# Patient Record
Sex: Female | Born: 1950
Health system: Southern US, Community
[De-identification: ages and names within clinical notes are randomized; demographics above are authoritative.]

## PROBLEM LIST (undated history)

## (undated) DIAGNOSIS — M1990 Unspecified osteoarthritis, unspecified site: Secondary | ICD-10-CM

## (undated) DIAGNOSIS — F329 Major depressive disorder, single episode, unspecified: Secondary | ICD-10-CM

## (undated) DIAGNOSIS — C9110 Chronic lymphocytic leukemia of B-cell type not having achieved remission: Secondary | ICD-10-CM

## (undated) DIAGNOSIS — K219 Gastro-esophageal reflux disease without esophagitis: Secondary | ICD-10-CM

## (undated) DIAGNOSIS — D72829 Elevated white blood cell count, unspecified: Secondary | ICD-10-CM

## (undated) DIAGNOSIS — D649 Anemia, unspecified: Secondary | ICD-10-CM

## (undated) DIAGNOSIS — F32A Depression, unspecified: Secondary | ICD-10-CM

## (undated) DIAGNOSIS — C801 Malignant (primary) neoplasm, unspecified: Secondary | ICD-10-CM

## (undated) DIAGNOSIS — F419 Anxiety disorder, unspecified: Secondary | ICD-10-CM

## (undated) DIAGNOSIS — G43909 Migraine, unspecified, not intractable, without status migrainosus: Secondary | ICD-10-CM

## (undated) DIAGNOSIS — H269 Unspecified cataract: Secondary | ICD-10-CM

## (undated) DIAGNOSIS — E041 Nontoxic single thyroid nodule: Secondary | ICD-10-CM

## (undated) DIAGNOSIS — T7840XA Allergy, unspecified, initial encounter: Secondary | ICD-10-CM

## (undated) HISTORY — PX: APPENDECTOMY: SHX54

## (undated) HISTORY — DX: Anemia, unspecified: D64.9

## (undated) HISTORY — PX: CERVICAL FUSION: SHX112

## (undated) HISTORY — DX: Malignant (primary) neoplasm, unspecified: C80.1

## (undated) HISTORY — PX: JOINT REPLACEMENT: SHX530

## (undated) HISTORY — DX: Unspecified cataract: H26.9

## (undated) HISTORY — PX: TONSILLECTOMY: SUR1361

## (undated) HISTORY — PX: OTHER SURGICAL HISTORY: SHX169

## (undated) HISTORY — DX: Unspecified osteoarthritis, unspecified site: M19.90

## (undated) HISTORY — PX: SPINE SURGERY: SHX786

## (undated) HISTORY — PX: BLADDER SURGERY: SHX569

## (undated) HISTORY — DX: Gastro-esophageal reflux disease without esophagitis: K21.9

## (undated) HISTORY — DX: Depression, unspecified: F32.A

## (undated) HISTORY — DX: Elevated white blood cell count, unspecified: D72.829

## (undated) HISTORY — PX: DILATION AND CURETTAGE OF UTERUS: SHX78

## (undated) HISTORY — DX: Allergy, unspecified, initial encounter: T78.40XA

## (undated) HISTORY — DX: Migraine, unspecified, not intractable, without status migrainosus: G43.909

## (undated) HISTORY — DX: Major depressive disorder, single episode, unspecified: F32.9

## (undated) HISTORY — DX: Anxiety disorder, unspecified: F41.9

---

## 1998-03-27 ENCOUNTER — Emergency Department (HOSPITAL_COMMUNITY): Admission: EM | Admit: 1998-03-27 | Discharge: 1998-03-27 | Payer: Self-pay | Admitting: Internal Medicine

## 1998-12-23 ENCOUNTER — Other Ambulatory Visit: Admission: RE | Admit: 1998-12-23 | Discharge: 1998-12-23 | Payer: Self-pay | Admitting: Obstetrics and Gynecology

## 1999-03-29 ENCOUNTER — Other Ambulatory Visit: Admission: RE | Admit: 1999-03-29 | Discharge: 1999-03-29 | Payer: Self-pay | Admitting: Obstetrics and Gynecology

## 1999-06-28 ENCOUNTER — Other Ambulatory Visit: Admission: RE | Admit: 1999-06-28 | Discharge: 1999-06-28 | Payer: Self-pay | Admitting: Obstetrics and Gynecology

## 1999-07-19 ENCOUNTER — Other Ambulatory Visit: Admission: RE | Admit: 1999-07-19 | Discharge: 1999-07-19 | Payer: Self-pay | Admitting: *Deleted

## 1999-07-19 ENCOUNTER — Encounter (INDEPENDENT_AMBULATORY_CARE_PROVIDER_SITE_OTHER): Payer: Self-pay | Admitting: *Deleted

## 1999-09-14 ENCOUNTER — Ambulatory Visit (HOSPITAL_BASED_OUTPATIENT_CLINIC_OR_DEPARTMENT_OTHER): Admission: RE | Admit: 1999-09-14 | Discharge: 1999-09-14 | Payer: Self-pay | Admitting: *Deleted

## 1999-10-22 ENCOUNTER — Other Ambulatory Visit: Admission: RE | Admit: 1999-10-22 | Discharge: 1999-10-22 | Payer: Self-pay | Admitting: *Deleted

## 2000-01-31 ENCOUNTER — Other Ambulatory Visit: Admission: RE | Admit: 2000-01-31 | Discharge: 2000-01-31 | Payer: Self-pay | Admitting: Obstetrics and Gynecology

## 2000-05-05 ENCOUNTER — Other Ambulatory Visit: Admission: RE | Admit: 2000-05-05 | Discharge: 2000-05-05 | Payer: Self-pay | Admitting: *Deleted

## 2000-07-09 ENCOUNTER — Emergency Department (HOSPITAL_COMMUNITY): Admission: EM | Admit: 2000-07-09 | Discharge: 2000-07-09 | Payer: Self-pay | Admitting: Emergency Medicine

## 2000-08-21 ENCOUNTER — Encounter: Admission: RE | Admit: 2000-08-21 | Discharge: 2000-08-21 | Payer: Self-pay | Admitting: Obstetrics and Gynecology

## 2000-08-21 ENCOUNTER — Encounter: Payer: Self-pay | Admitting: Obstetrics and Gynecology

## 2000-09-20 ENCOUNTER — Other Ambulatory Visit: Admission: RE | Admit: 2000-09-20 | Discharge: 2000-09-20 | Payer: Self-pay | Admitting: Obstetrics and Gynecology

## 2001-01-09 ENCOUNTER — Ambulatory Visit (HOSPITAL_COMMUNITY): Admission: RE | Admit: 2001-01-09 | Discharge: 2001-01-09 | Payer: Self-pay | Admitting: Obstetrics and Gynecology

## 2001-01-09 ENCOUNTER — Encounter: Payer: Self-pay | Admitting: Obstetrics and Gynecology

## 2001-01-30 ENCOUNTER — Other Ambulatory Visit: Admission: RE | Admit: 2001-01-30 | Discharge: 2001-01-30 | Payer: Self-pay | Admitting: Obstetrics and Gynecology

## 2002-04-17 ENCOUNTER — Other Ambulatory Visit: Admission: RE | Admit: 2002-04-17 | Discharge: 2002-04-17 | Payer: Self-pay | Admitting: Obstetrics and Gynecology

## 2002-04-18 ENCOUNTER — Ambulatory Visit (HOSPITAL_COMMUNITY): Admission: RE | Admit: 2002-04-18 | Discharge: 2002-04-18 | Payer: Self-pay | Admitting: Obstetrics and Gynecology

## 2002-04-18 ENCOUNTER — Encounter: Payer: Self-pay | Admitting: Obstetrics and Gynecology

## 2002-08-28 ENCOUNTER — Ambulatory Visit (HOSPITAL_COMMUNITY): Admission: RE | Admit: 2002-08-28 | Discharge: 2002-08-28 | Payer: Self-pay | Admitting: Gastroenterology

## 2003-05-13 ENCOUNTER — Encounter: Payer: Self-pay | Admitting: Obstetrics and Gynecology

## 2003-05-13 ENCOUNTER — Ambulatory Visit (HOSPITAL_COMMUNITY): Admission: RE | Admit: 2003-05-13 | Discharge: 2003-05-13 | Payer: Self-pay | Admitting: Obstetrics and Gynecology

## 2003-07-03 ENCOUNTER — Other Ambulatory Visit: Admission: RE | Admit: 2003-07-03 | Discharge: 2003-07-03 | Payer: Self-pay | Admitting: Obstetrics and Gynecology

## 2003-08-22 ENCOUNTER — Ambulatory Visit (HOSPITAL_BASED_OUTPATIENT_CLINIC_OR_DEPARTMENT_OTHER): Admission: RE | Admit: 2003-08-22 | Discharge: 2003-08-22 | Payer: Self-pay | Admitting: Urology

## 2004-02-05 ENCOUNTER — Encounter: Admission: RE | Admit: 2004-02-05 | Discharge: 2004-02-05 | Payer: Self-pay | Admitting: Family Medicine

## 2004-03-03 ENCOUNTER — Encounter: Admission: RE | Admit: 2004-03-03 | Discharge: 2004-03-03 | Payer: Self-pay | Admitting: Gastroenterology

## 2004-06-16 ENCOUNTER — Ambulatory Visit (HOSPITAL_COMMUNITY): Admission: RE | Admit: 2004-06-16 | Discharge: 2004-06-16 | Payer: Self-pay | Admitting: Obstetrics and Gynecology

## 2004-10-27 ENCOUNTER — Ambulatory Visit: Payer: Self-pay | Admitting: Internal Medicine

## 2005-02-10 ENCOUNTER — Ambulatory Visit: Payer: Self-pay | Admitting: Family Medicine

## 2005-02-16 ENCOUNTER — Ambulatory Visit: Payer: Self-pay

## 2005-03-02 ENCOUNTER — Ambulatory Visit: Payer: Self-pay | Admitting: Family Medicine

## 2005-05-06 ENCOUNTER — Ambulatory Visit: Payer: Self-pay | Admitting: Family Medicine

## 2005-06-20 ENCOUNTER — Ambulatory Visit (HOSPITAL_COMMUNITY): Admission: RE | Admit: 2005-06-20 | Discharge: 2005-06-20 | Payer: Self-pay | Admitting: Obstetrics and Gynecology

## 2006-04-19 ENCOUNTER — Ambulatory Visit: Payer: Self-pay | Admitting: Family Medicine

## 2006-05-02 ENCOUNTER — Ambulatory Visit: Payer: Self-pay | Admitting: Family Medicine

## 2006-06-22 ENCOUNTER — Ambulatory Visit (HOSPITAL_COMMUNITY): Admission: RE | Admit: 2006-06-22 | Discharge: 2006-06-22 | Payer: Self-pay | Admitting: Obstetrics and Gynecology

## 2007-03-07 ENCOUNTER — Ambulatory Visit: Payer: Self-pay | Admitting: Family Medicine

## 2007-03-08 ENCOUNTER — Encounter: Admission: RE | Admit: 2007-03-08 | Discharge: 2007-03-08 | Payer: Self-pay | Admitting: Family Medicine

## 2007-03-13 ENCOUNTER — Telehealth (INDEPENDENT_AMBULATORY_CARE_PROVIDER_SITE_OTHER): Payer: Self-pay | Admitting: Internal Medicine

## 2007-03-15 ENCOUNTER — Telehealth (INDEPENDENT_AMBULATORY_CARE_PROVIDER_SITE_OTHER): Payer: Self-pay | Admitting: Internal Medicine

## 2007-03-18 ENCOUNTER — Encounter: Admission: RE | Admit: 2007-03-18 | Discharge: 2007-03-18 | Payer: Self-pay | Admitting: Family Medicine

## 2007-04-06 ENCOUNTER — Encounter (INDEPENDENT_AMBULATORY_CARE_PROVIDER_SITE_OTHER): Payer: Self-pay | Admitting: Internal Medicine

## 2007-05-15 ENCOUNTER — Encounter (INDEPENDENT_AMBULATORY_CARE_PROVIDER_SITE_OTHER): Payer: Self-pay | Admitting: Internal Medicine

## 2007-06-07 ENCOUNTER — Observation Stay (HOSPITAL_COMMUNITY): Admission: RE | Admit: 2007-06-07 | Discharge: 2007-06-08 | Payer: Self-pay | Admitting: Neurosurgery

## 2007-06-22 ENCOUNTER — Encounter (INDEPENDENT_AMBULATORY_CARE_PROVIDER_SITE_OTHER): Payer: Self-pay | Admitting: Internal Medicine

## 2007-07-24 ENCOUNTER — Encounter: Payer: Self-pay | Admitting: Family Medicine

## 2007-07-26 ENCOUNTER — Ambulatory Visit (HOSPITAL_COMMUNITY): Admission: RE | Admit: 2007-07-26 | Discharge: 2007-07-26 | Payer: Self-pay | Admitting: Obstetrics and Gynecology

## 2007-08-31 ENCOUNTER — Encounter: Payer: Self-pay | Admitting: Family Medicine

## 2007-10-23 ENCOUNTER — Encounter (INDEPENDENT_AMBULATORY_CARE_PROVIDER_SITE_OTHER): Payer: Self-pay | Admitting: Internal Medicine

## 2007-10-26 ENCOUNTER — Encounter (INDEPENDENT_AMBULATORY_CARE_PROVIDER_SITE_OTHER): Payer: Self-pay | Admitting: Internal Medicine

## 2007-10-26 DIAGNOSIS — D126 Benign neoplasm of colon, unspecified: Secondary | ICD-10-CM | POA: Insufficient documentation

## 2007-12-31 ENCOUNTER — Ambulatory Visit: Payer: Self-pay | Admitting: Family Medicine

## 2007-12-31 DIAGNOSIS — B351 Tinea unguium: Secondary | ICD-10-CM | POA: Insufficient documentation

## 2008-01-22 ENCOUNTER — Encounter (INDEPENDENT_AMBULATORY_CARE_PROVIDER_SITE_OTHER): Payer: Self-pay | Admitting: Internal Medicine

## 2008-02-05 ENCOUNTER — Encounter (INDEPENDENT_AMBULATORY_CARE_PROVIDER_SITE_OTHER): Payer: Self-pay | Admitting: Internal Medicine

## 2008-02-19 ENCOUNTER — Encounter (INDEPENDENT_AMBULATORY_CARE_PROVIDER_SITE_OTHER): Payer: Self-pay | Admitting: Internal Medicine

## 2008-03-05 ENCOUNTER — Encounter: Admission: RE | Admit: 2008-03-05 | Discharge: 2008-03-05 | Payer: Self-pay | Admitting: Neurosurgery

## 2008-03-07 ENCOUNTER — Encounter (INDEPENDENT_AMBULATORY_CARE_PROVIDER_SITE_OTHER): Payer: Self-pay | Admitting: Internal Medicine

## 2008-03-27 ENCOUNTER — Ambulatory Visit (HOSPITAL_COMMUNITY): Admission: RE | Admit: 2008-03-27 | Discharge: 2008-03-28 | Payer: Self-pay | Admitting: Neurosurgery

## 2008-04-25 ENCOUNTER — Encounter (INDEPENDENT_AMBULATORY_CARE_PROVIDER_SITE_OTHER): Payer: Self-pay | Admitting: Internal Medicine

## 2008-06-04 ENCOUNTER — Encounter (INDEPENDENT_AMBULATORY_CARE_PROVIDER_SITE_OTHER): Payer: Self-pay | Admitting: Internal Medicine

## 2008-06-13 ENCOUNTER — Encounter (INDEPENDENT_AMBULATORY_CARE_PROVIDER_SITE_OTHER): Payer: Self-pay | Admitting: Internal Medicine

## 2008-07-28 ENCOUNTER — Ambulatory Visit (HOSPITAL_COMMUNITY): Admission: RE | Admit: 2008-07-28 | Discharge: 2008-07-28 | Payer: Self-pay | Admitting: Obstetrics

## 2008-09-12 ENCOUNTER — Encounter (INDEPENDENT_AMBULATORY_CARE_PROVIDER_SITE_OTHER): Payer: Self-pay | Admitting: Internal Medicine

## 2009-01-01 ENCOUNTER — Ambulatory Visit: Payer: Self-pay | Admitting: Family Medicine

## 2009-01-03 ENCOUNTER — Encounter (INDEPENDENT_AMBULATORY_CARE_PROVIDER_SITE_OTHER): Payer: Self-pay | Admitting: Internal Medicine

## 2009-01-05 ENCOUNTER — Telehealth: Payer: Self-pay | Admitting: Family Medicine

## 2009-01-13 ENCOUNTER — Encounter (INDEPENDENT_AMBULATORY_CARE_PROVIDER_SITE_OTHER): Payer: Self-pay | Admitting: Internal Medicine

## 2009-01-14 ENCOUNTER — Encounter (INDEPENDENT_AMBULATORY_CARE_PROVIDER_SITE_OTHER): Payer: Self-pay | Admitting: Internal Medicine

## 2009-07-29 ENCOUNTER — Ambulatory Visit (HOSPITAL_COMMUNITY): Admission: RE | Admit: 2009-07-29 | Discharge: 2009-07-29 | Payer: Self-pay | Admitting: Obstetrics

## 2010-08-06 ENCOUNTER — Ambulatory Visit (HOSPITAL_COMMUNITY): Admission: RE | Admit: 2010-08-06 | Discharge: 2010-08-06 | Payer: Self-pay | Admitting: Obstetrics

## 2010-10-31 ENCOUNTER — Encounter: Payer: Self-pay | Admitting: Family Medicine

## 2010-11-09 NOTE — Consult Note (Signed)
Summary: [facility]/Dr[facility]/Dr. Newell Coral   Imported By: Eleonore Chiquito 07/08/2008 11:14:15  _____________________________________________________________________  External Attachment:    Type:   Image     Comment:   External Document

## 2011-02-22 NOTE — Op Note (Signed)
NAMEJARRAH, Kimberly NO.:  [id number]   MEDICAL RECORD NO.:  [id number]          PATIENT TYPE:  INP   LOCATION:  3172                         FACILITY:  [facility]   PHYSICIAN:  Hewitt Shorts, M.D.DATE OF BIRTH:  13-Dec-1950   DATE OF PROCEDURE:  06/07/2007  DATE OF DISCHARGE:                               OPERATIVE REPORT   PREOPERATIVE DIAGNOSES:  1. Cervical spondylosis.  2. Cervical spondylitic disk herniation.  3. Cervical degenerative disk disease.   POSTOPERATIVE DIAGNOSES:  1. Cervical spondylosis.  2. Cervical spondylitic disk herniation.  3. Cervical degenerative disk disease.   PROCEDURE:  C5-6 and C6-7 anterior cervical diskectomy and arthrodesis  with allograft and titanium cervical plating.   SURGEON:  Hewitt Shorts, M.D.   ASSISTANTS:  Nelia Shi. Webb Silversmith, NP and Danae Orleans. Venetia Maxon, M.D.   ANESTHESIA:  General endotracheal.   INDICATIONS FOR PROCEDURE:  The patient is a 60 year old woman who  presented with neck and radicular pain.  X-rays and MRI scans show multi-  level degenerative disk disease and spondylosis with spondylitic disk  herniation.  The decision was made to proceed with two-level anterior  cervical diskectomy and arthrodesis.   DESCRIPTION OF PROCEDURE:  The patient was brought to the operating room  and placed under general endotracheal anesthesia.  The patient was  placed in 10 pounds of halter traction.  The neck was prepped with  Betadine soap solution on sterile fashion.  A horizontal incision was  made in the left side of the neck.  The line of the incision was  infiltrated with 1% lidocaine with epinephrine.  Incision was carried  down to the subcutaneous tissue and platysma.  Bipolar electrocautery  was used to maintain hemostasis.   Dissection was carried out through the avascular plane between the  sternocleidomastoid, carotid artery and jugular vein lateral and trachea  and esophagus medially.  The ventral  aspect of the vertebral column was  identified and localizing x-ray taken.  The C5-6 and C6-7 intervertebral  disk space was identified.  Diskectomy was begun with incision of the  annulus and continued with microcurettes and pituitary rongeurs.  There  was significant anterior osteophytic overgrowth particularly at the C5-6  level which was carefully removed using an osteophyte removal tool, as  well as the XMax drill.   The microscope was draped and brought onto the field to provide  additional magnification and visualization.  The remainder of the  decompression was performed using microdissection microsurgical  technique.  Diskectomy was continued with microcurettes and pituitary  rongeurs.  The cartilaginous endplates of the vertebral bodies were  removed using microcurettes and the XMax drill, and then posterior  osteophytic overgrowth was removed using the XMax drill along with a 2  mm Kerrison punch with a thin foot plate.  The posterior longitudinal  ligament was carefully removed, and we were able to decompress the  spinal canal and thecal sac.  We then turned our attention to the neural  foramina which were similarly decompressed.  Once the decompression of  the spinal canal and neural foramen was completed, hemostasis  was  established with the use of Gelfoam soaked in thrombin.   Then we proceeded with the arthrodesis.  We measured the height of each  intervertebral disk space and selected two 7 mm implants.  Each of these  allograft implants was hydrated in saline solution and positioned in the  intervertebral disk space and counter sunk.  The cervical traction was  then discontinued and we then selected a 35 mm titanium cervical plate.  It was positioned over the fusion contour and secured to the vertebrae  with 4 x 13 mm variable angled screws, placing a pair of screws at C5,  another pair at C7 and a single screw at C6.  Each screw  hole was  drilled and tapped and the  screws were placed in an alternating fashion.  Once all five screws were in place final tightening was performed.   The wound was irrigated with bacitracin solution and checked for  hemostasis which was established and confirmed, and then we proceeded  with closure.  The platysma was closed with interrupted inverted 2-0  Vicryl suture, the subcutaneous and subcuticular were closed with  interrupted inverted 3-0 Vicryl and the skin was reapproximated with  Dermabond.  The procedure was tolerated well.  The estimated blood loss  was 150 mL.  Sponge counts were correct.  Following surgery the patient  was placed in a soft cervical collar, reversed from anesthetic,  extubated and transferred to the recovery room for further care, where  she was noted to be moving all four extremities to command.      Hewitt Shorts, M.D.  Electronically Signed     RWN/MEDQ  D:  06/07/2007  T:  06/07/2007  Job:  [id number]

## 2011-02-22 NOTE — Op Note (Signed)
NAMELEIANI, ENRIGHT               ACCOUNT NO.:  [id number]   MEDICAL RECORD NO.:  [id number]          PATIENT TYPE:  OIB   LOCATION:  3526                         FACILITY:  [facility]   PHYSICIAN:  Hewitt Shorts, M.D.DATE OF BIRTH:  Feb 22, 1951   DATE OF PROCEDURE:  03/27/2008  DATE OF DISCHARGE:                               OPERATIVE REPORT   PREOPERATIVE DIAGNOSIS:  Nonunion of C5-6 and C6-7 anterior cervical  arthrodesis, cervalgia.   POSTOPERATIVE DIAGNOSIS:  Nonunion of C5-6 and C6-7 anterior cervical  arthrodesis, cervalgia.   PROCEDURE:  C5-7 posterior cervical arthrodesis with Oasis posterior  instrumentation, Infuse and Actifuse putty.   ASSISTANT:  Webb Silversmith, NP, and Venetia Maxon.   ANESTHESIA:  General endotracheal.   INDICATIONS:  The patient is a 60 year old woman.  She is over 9 months  status post a 2-level C5-6 to C6-7 anterior cervical decompression and  arthrodesis.  Unfortunately, her fusion has not been healing, and she  has posterior neck pain.  Decision was made to proceed with supplemental  posterior cervical arthrodesis.   DESCRIPTION OF PROCEDURE:  The patient was brought to the operating room  and placed under general endotracheal anesthesia.  The patient was  placed in a 3-pin Mayfield head holder, and the patient was then  carefully turned to a prone position with the head and neck firmly  secured.  The posterior aspect of the neck was prepped with Betadine  soap and solution, draped in a sterile fashion and then the C7 spinous  process was identified by its physiognomy and then a midline incision  was made over the C5-7 level.  The midline was infiltrated with local  anesthetic with epinephrine prior to skin incision and dissection was  carried down to the subcutaneous tissue.  Bipolar cautery and  electrocautery used to maintain hemostasis.  Dissection was carried down  to the posterior cervical fascia which was incised bilaterally and the  paracervical  musculature was dissected from the spinous process and  lamina in a subperiosteal fashion.  Self-retaining retractors were  placed, and we identified the C4, C5, C6, and C7 spinous process and  lamina.  An x-ray was taken to confirm the localization and then  dissection was carried laterally exposing the facets bilaterally at C5,  C6, and C7, and then the C-arm fluoroscope was draped and brought onto  the field.  We identified entry points for the lateral mass screws and  with C-arm fluoroscopic guidance, an awl was used to initiate the  pointed screw insertion and then we carefully drilled into the lateral  mass and in the oblique trajectory going from inferior medial posterior  to superior lateral anterior.  Each of the screw holes was drilled,  examined with a ball probe.  Good bony surface was noted.  Each was  tapped and then we placed 3.5-mm in-diameter screws placing 14-mm screws  at C5 and C7 and 12-mm screws at C6.  Once all six screws were in place,  we selected 40-mm rods that were lordosed using a [place]  They  were placed within the screw heads and secured  with locking caps, which  were subsequently tightened against the counter torque.  We decorticated  the lamina, facets, and facet joints bilaterally at C5, C6, and C7 and  at the C5-6 and C6-7 facet joints and then, a medium-size Infuse was  used.  We placed 1 pledget lateral to the screws over the lateral  portion of the facets, another pledget over the medial aspect of the  facets and lamina on each side.  We then packed a 10-mL volume of  Actifuse putty in and around the facets and lamina of C5, C6, and C7 and  then within the C5-6 and C6-7 facet joints.  We then proceeded with  closure.  The deep fascia closed with undyed 0-Vicryl sutures.  The  subcutaneous and subcuticular were closed with interrupted inverted 2-0  undyed Vicryl sutures.  The skin was closed with Dermabond.  The  procedure was tolerated well.   The dressing of Adaptic and sterile gauze  and Hyperfix was applied  and the patient was placed in an Aspen cervical collar, turned back to  supine position.  The 3-pin Mayfield head holder was removed.  The  patient was then reversed from the anesthetic, extubated, and  transferred to the recovery room for further care where she was noted to  be following commands with all 4 extremities.      Hewitt Shorts, M.D.  Electronically Signed     RWN/MEDQ  D:  03/27/2008  T:  03/28/2008  Job:  [id number]

## 2011-02-25 NOTE — Op Note (Signed)
NAME:  Kimberly King, Kimberly King                         ACCOUNT NO.:  [id number]   MEDICAL RECORD NO.:  [id number]                   PATIENT TYPE:  AMB   LOCATION:  ENDO                                 FACILITY:  [facility]   PHYSICIAN:  Anselmo Rod, M.D.               DATE OF BIRTH:  April 17, 1951   DATE OF PROCEDURE:  08/28/2002  DATE OF DISCHARGE:                                 OPERATIVE REPORT   PROCEDURE:  Screening colonoscopy, endoscopy.   ENDOSCOPIST:  Anselmo Rod, M.D.   PROCEDURE:  Screening colonoscopy, endoscopy.   ENDOSCOPIST:  Anselmo Rod, M.D.   INSTRUMENT:  Olympus video colonoscope.   INDICATIONS FOR PROCEDURE:  A 60 year old white female with history of colon  cancer in the family. To rule out any polyps, masses, hemorrhoids, etc.   PRE-PROCEDURE PREPARATION:  Informed consent was obtained from the patient.  Patient fasted for 8 hours prior to the procedure and prepped with a bottle  of magnesium citrate and a gallon of NuLytely the night prior to the  procedure.  Pre-procedure physical:  Patient had stable vital signs, neck  supple, chest clear to auscultation, respirations regular, abdomen soft with  normal bowel sounds.   DESCRIPTION OF PROCEDURE:  The patient was placed in the left lateral  decubitus position, sedated with 100 mg of Demerol and 10 mg of Versed  intravenously.  Once the patient was adequately sedated and maintained on  low flow oxygen, continuous cardiac monitoring; the Olympus video  colonoscope was advanced from the rectum to the cecum, and terminal ileum  with difficulty. The patient had a very tortuous colon and her position had  to be changed from the left lateral to supine, to right lateral position to  reach the terminal ileum.  Abdominal pressure was applied gently to  facilitate the scope into the cecum. The appendiceal orifice and the  ileocecal valve were clearly visualized, photographed. The terminal ileum  appeared normal, no  masses, polyps, erosions or ulcerations were seen. There  was no evidence of diverticulosis. Small internal hemorrhoids were seen on  retroflexion.  The patient tolerated the procedure well without  complications.   IMPRESSION:  1. Very tortuous colon.  2. No masses or polyps seen.  3. Small, nonbleeding internal hemorrhoids.  4. Normal terminal ileum.   RECOMMENDATIONS:  1. A high fiber diet with liberal fluids intake has been advocated.  2.     Repeat colorectal cancer screening in the next 5 years unless the patient     develops abnormal symptoms in the interim.  3. Outpatient follow up on a p.r.n. basis.                                                 Jyothi Nat Loreta Ave,  M.D.    JNM/MEDQ  D:  08/28/2002  T:  08/28/2002  Job:  [id number]   cc:   Maxie Better, M.D.  [address]  [address]  [place]  [place] [address]  Fax: [phone]

## 2011-02-25 NOTE — Op Note (Signed)
NAME:  HONEST, SAFRANEK                         ACCOUNT NO.:  [id number]   MEDICAL RECORD NO.:  [id number]                   PATIENT TYPE:  AMB   LOCATION:  NESC                                 FACILITY:  [facility]   PHYSICIAN:  Mark C. Vernie Ammons, M.D.               DATE OF BIRTH:  1950/10/17   DATE OF PROCEDURE:  08/22/2003  DATE OF DISCHARGE:                                 OPERATIVE REPORT   PREOPERATIVE DIAGNOSES:  1. Cystocele  2. Stress urinary incontinence.   POSTOPERATIVE DIAGNOSIS:  1. Cystocele.  2. Stress urinary incontinence.   PROCEDURES:  1. Cystocele repair.  2. Transobturator suburethral sling placement.  3. Cystoscopy.   SURGEON:  Mark C. Vernie Ammons, M.D.   ASSISTANT:  Susanne Borders, M.D.   ANESTHESIA:  General endotracheal.   ESTIMATED BLOOD LOSS:  100 mL.   DRAINS:  None.   SPECIMENS:  None.   COMPLICATIONS:  None.   DISPOSITION:  To postanesthesia care unit in stable condition.   INDICATION FOR PROCEDURE:  Ms. Kimberly King is a 60 year old female who has been  followed by Dr. Vernie Ammons for stress urinary incontinence.  She has also been  diagnosed with a cystocele by her gynecologist.  Her incontinence is mainly  of a stress component with coughing, sneezing, and laughing.  She was  examined by Dr. Vernie Ammons in the office and found to have a grade 2 cystocele,  which bulged to the vaginal introitus with Valsalva.  Her various surgical  options were discussed with her, and she consented to cystocele repair with  placement of a suburethral sling after understanding the risks, benefits,  and alternatives.   DESCRIPTION OF PROCEDURE:  The patient was brought to the operating room and  correctly identified by her identification bracelet.  She was given  preoperative antibiotics and general endotracheal anesthesia.  Her genitalia  were shaved, prepped, and draped in typical sterile fashion.  She was placed  in the dorsal lithotomy position.  A weighted vaginal speculum  was placed  with a moistened gauze in the posterior vagina.  A 16 French Foley catheter  was placed in the urethra.  An exam under anesthesia was performed, which  demonstrated the cystocele as previously described.  There was no evidence  of an enterocele or a rectocele.  There were some crypts in the anterior  vaginal mucosa but no evidence of any inflammatory process.  The anterior  vaginal wall was injected with a lidocaine/epinephrine solution to  facilitate dissection of the anterior vaginal wall from the bladder.  A  midline incision was made from just below the urethra to approximately 4 cm  proximal to the cervix.  Strully scissors were used to begin the initial  dissection between the vaginal mucosa and the bladder bilaterally.  Once the  planes were clearly established, blunt dissection was performed using  surgeon's finger and a dry gauze.  The vaginal mucosa separated  nicely from  the bladder tissue below.  The dissection was carried back posteriorly and  laterally such that the pubocervical fascia and cardinal ligaments were  exposed.  Once this adequate exposure was obtained, the cystocele was  repaired by placing several 2-0 Vicryl  sutures in a figure-of-eight  fashion, first in the cardinal ligaments posteriorly and then in the  pubocervical fascia, progressing anteriorly.  In all, four interrupted  sutures were placed and this repaired the central cystocele defect quite  nicely.  This repair of the cystocele produced a redundancy of vaginal  mucosa, and the mucosa was trimmed on both sides with curved Mayo scissors.  Next attention was turned to the suburethral sling placement.  At the level  of the mid-ureter, a plane was established with Strully scissors such that  the posterior obturator membrane could be palpated with the tip of the  surgeon's finger.  Bilateral  stab incisions were made approximately 5 cm  lateral to the clitoris on both sides.  The transobturator  trocar was passed  through the stab incisions and guided out through the correct plane in the  vaginal mucosa defect by guiding the tip of the trocar on the surgeon's  finger.  The sling material was attached to the trocar and pulled through  the stab incision.  This was repeated on the other side.  The transobturator  sling was pulled through both stab incisions until the appropriate degree of  tension was obtained.  The level of tension was such that a right angle  could easily be passed between the posterior urethra and the sling material.  The natural tendency of the sling was to rest somewhat more proximal toward  the bladder neck than was appropriate.  Therefore, we tacked the edge of the  sling to some suburethral tissue near the midurethra.  This situated sling  quite nicely in the appropriate position.  The wound was then copiously  irrigated with antibiotic solution.  There were no signs of any bleeding.  The vaginal mucosa was then closed with a running 2-0 Vicryl.  The stab  incisions were closed with application of Dermabond after the excess sling  material was trimmed at the skin level.  Prior to removing the excess sling  material, cystoscopy was performed, which demonstrated no evidence of any  mucosal trauma from passage of the transobturator trocar.  Bilateral  ureteral orifices were identified in the normal anatomic location.  The  patient was then awakened from her anesthesia without complications, having  tolerated the procedure very well.  Please note that Dr. Vernie Ammons was present  and participated in all aspects of this case, as he was the primary surgeon.  The patient was taken to the postanesthesia care unit in stable condition.     Susanne Borders, MD                           Veverly Fells. Vernie Ammons, M.D.    DR/MEDQ  D:  08/22/2003  T:  08/22/2003  Job:  [id number]

## 2011-07-06 ENCOUNTER — Other Ambulatory Visit: Payer: Self-pay | Admitting: Nurse Practitioner

## 2011-07-06 ENCOUNTER — Other Ambulatory Visit (HOSPITAL_COMMUNITY): Payer: Self-pay | Admitting: Obstetrics

## 2011-07-06 DIAGNOSIS — Z139 Encounter for screening, unspecified: Secondary | ICD-10-CM

## 2011-07-07 LAB — CBC
HCT: 39.4
Hemoglobin: 13.3
MCHC: 33.9
MCV: 90.6
Platelets: 374
RBC: 4.34
RDW: 13.5
WBC: 12 — ABNORMAL HIGH

## 2011-07-22 LAB — URINALYSIS, ROUTINE W REFLEX MICROSCOPIC
Bilirubin Urine: NEGATIVE
Glucose, UA: NEGATIVE
Hgb urine dipstick: NEGATIVE
Ketones, ur: NEGATIVE
Nitrite: NEGATIVE
Protein, ur: NEGATIVE
Specific Gravity, Urine: 1.016
Urobilinogen, UA: 0.2
pH: 7

## 2011-07-22 LAB — URINE CULTURE: Colony Count: >=100000

## 2011-07-22 LAB — CBC
HCT: 38.1
Hemoglobin: 13
MCHC: 34.1
MCV: 90.6
Platelets: 469 — ABNORMAL HIGH
RBC: 4.2
RDW: 13.8
WBC: 13.1 — ABNORMAL HIGH

## 2011-07-22 LAB — URINE MICROSCOPIC-ADD ON

## 2011-08-08 ENCOUNTER — Ambulatory Visit (HOSPITAL_COMMUNITY)
Admission: RE | Admit: 2011-08-08 | Discharge: 2011-08-08 | Disposition: A | Discharge status: Home / Self Care | Payer: BC Managed Care – PPO | Source: Ambulatory Visit | Attending: Obstetrics | Admitting: Obstetrics

## 2011-08-08 DIAGNOSIS — Z139 Encounter for screening, unspecified: Secondary | ICD-10-CM

## 2011-08-08 DIAGNOSIS — Z1231 Encounter for screening mammogram for malignant neoplasm of breast: Secondary | ICD-10-CM | POA: Insufficient documentation

## 2012-05-31 ENCOUNTER — Encounter (INDEPENDENT_AMBULATORY_CARE_PROVIDER_SITE_OTHER): Payer: BC Managed Care – PPO | Admitting: Internal Medicine

## 2012-05-31 DIAGNOSIS — Z801 Family history of malignant neoplasm of trachea, bronchus and lung: Secondary | ICD-10-CM

## 2012-05-31 DIAGNOSIS — Z85820 Personal history of malignant melanoma of skin: Secondary | ICD-10-CM

## 2012-05-31 DIAGNOSIS — C9110 Chronic lymphocytic leukemia of B-cell type not having achieved remission: Secondary | ICD-10-CM

## 2012-07-09 ENCOUNTER — Other Ambulatory Visit (HOSPITAL_COMMUNITY): Payer: Self-pay | Admitting: Obstetrics

## 2012-07-09 DIAGNOSIS — Z139 Encounter for screening, unspecified: Secondary | ICD-10-CM

## 2012-08-13 ENCOUNTER — Ambulatory Visit (HOSPITAL_COMMUNITY)
Admission: RE | Admit: 2012-08-13 | Discharge: 2012-08-13 | Disposition: A | Discharge status: Home / Self Care | Payer: BC Managed Care – PPO | Source: Ambulatory Visit | Attending: Obstetrics | Admitting: Obstetrics

## 2012-08-13 DIAGNOSIS — Z139 Encounter for screening, unspecified: Secondary | ICD-10-CM

## 2012-08-29 ENCOUNTER — Encounter (HOSPITAL_COMMUNITY): Payer: BC Managed Care – PPO

## 2012-10-10 HISTORY — PX: COLONOSCOPY: SHX174

## 2012-11-16 DIAGNOSIS — C9110 Chronic lymphocytic leukemia of B-cell type not having achieved remission: Secondary | ICD-10-CM

## 2013-03-21 ENCOUNTER — Encounter: Payer: Self-pay | Admitting: Internal Medicine

## 2013-05-06 ENCOUNTER — Ambulatory Visit (AMBULATORY_SURGERY_CENTER): Payer: BC Managed Care – PPO | Admitting: *Deleted

## 2013-05-06 VITALS — Ht 64.0 in | Wt 176.0 lb

## 2013-05-06 DIAGNOSIS — Z86010 Personal history of colonic polyps: Secondary | ICD-10-CM

## 2013-05-06 MED ORDER — MOVIPREP 100 G PO SOLR: ORAL | Status: DC

## 2013-05-06 NOTE — Progress Notes (Signed)
Patient denies any allergies to eggs or soy. Patient denies any problems with anesthesia.  

## 2013-05-07 ENCOUNTER — Encounter: Payer: Self-pay | Admitting: Internal Medicine

## 2013-05-16 DIAGNOSIS — C9110 Chronic lymphocytic leukemia of B-cell type not having achieved remission: Secondary | ICD-10-CM

## 2013-05-20 ENCOUNTER — Ambulatory Visit (AMBULATORY_SURGERY_CENTER): Payer: BC Managed Care – PPO | Admitting: Internal Medicine

## 2013-05-20 ENCOUNTER — Encounter: Payer: Self-pay | Admitting: Internal Medicine

## 2013-05-20 VITALS — BP 132/77 | HR 74 | Temp 98.1°F | Resp 34 | Ht 64.0 in | Wt 176.0 lb

## 2013-05-20 DIAGNOSIS — D126 Benign neoplasm of colon, unspecified: Secondary | ICD-10-CM

## 2013-05-20 DIAGNOSIS — Z1211 Encounter for screening for malignant neoplasm of colon: Secondary | ICD-10-CM

## 2013-05-20 DIAGNOSIS — Z86010 Personal history of colonic polyps: Secondary | ICD-10-CM

## 2013-05-20 MED ORDER — SODIUM CHLORIDE 0.9 % IV SOLN: 500.0000 mL | INTRAVENOUS | Status: DC

## 2013-05-20 NOTE — Progress Notes (Signed)
Patient did not have preoperative order for IV antibiotic SSI prophylaxis. (G8918)  Patient did not experience any of the following events: a burn prior to discharge; a fall within the facility; wrong site/side/patient/procedure/implant event; or a hospital transfer or hospital admission upon discharge from the facility. (G8907)  

## 2013-05-20 NOTE — Patient Instructions (Addendum)

## 2013-05-20 NOTE — Op Note (Signed)
[facility] [address] [place], [address]   COLONOSCOPY PROCEDURE REPORT  PATIENT: Kimberly King, Kimberly King  MR#: [id number] BIRTHDATE: Jan 13, 1951 , 62  yrs. old GENDER: Female ENDOSCOPIST: Beverley Fiedler, MD REFERRED Wyvonnia Dusky, M.D. PROCEDURE DATE:  05/20/2013 PROCEDURE:   Colonoscopy with snare polypectomy First Screening Colonoscopy - Avg.  risk and is 50 yrs.  old or older - No.  Prior Negative Screening - Now for repeat screening. N/A  History of Adenoma - Now for follow-up colonoscopy & has been > or = to 3 yrs.  Yes hx of adenoma.  Has been 3 or more years since last colonoscopy.  Polyps Removed Today? Yes. ASA CLASS:   Class II INDICATIONS:Colorectal cancer screening, Patient's personal history of colon polyps, and Last colonoscopy performed 5 years ago. MEDICATIONS: MAC sedation, administered by CRNA, Propofol (Diprivan), and propofol (Diprivan) 300mg  IV  DESCRIPTION OF PROCEDURE:   After the risks benefits and alternatives of the procedure were thoroughly explained, informed consent was obtained.  A digital rectal exam revealed no rectal mass.   The LB PFC-H190 [id number]  endoscope was introduced through the anus and advanced to the cecum, which was identified by both the appendix and ileocecal valve. No adverse events experienced. The quality of the prep was good, using MoviPrep  The instrument was then slowly withdrawn as the colon was fully examined.  COLON FINDINGS: Three sessile polyps measuring 3-6 mm in size were found in the distal sigmoid colon and rectum.  Polypectomy was performed using cold snare.  All resections were complete and all polyp tissue was completely retrieved.   The colon mucosa was otherwise normal.  Retroflexed views revealed internal hemorrhoids. The time to cecum=2 minutes 44 seconds.  Withdrawal time=13 minutes 35 seconds.  The scope was withdrawn and the procedure completed. COMPLICATIONS: There were no  complications.  ENDOSCOPIC IMPRESSION: 1.   Three sessile polyps measuring 3-6 mm in size were found in the distal sigmoid colon and rectum; Polypectomy was performed using cold snare 2.   The colon mucosa was otherwise normal 3.   Small internal hemorrhoids  RECOMMENDATIONS: 1.  Hold aspirin, aspirin products, and anti-inflammatory medication for 1 week. 2.  Await pathology results 3.  Timing of repeat colonoscopy will be determined by pathology findings. 4.  You will receive a letter within 1-2 weeks with the results of your biopsy as well as final recommendations.  Please call my office if you have not received a letter after 3 weeks.   eSigned:  Beverley Fiedler, MD 05/20/2013 9:00 AM cc: The Patient and Kellie Shropshire, MD

## 2013-05-20 NOTE — Progress Notes (Signed)
Called to room to assist during endoscopic procedure.  Patient ID and intended procedure confirmed with present staff. Received instructions for my participation in the procedure from the performing physician.  

## 2013-05-21 ENCOUNTER — Telehealth: Payer: Self-pay | Admitting: *Deleted

## 2013-05-21 NOTE — Telephone Encounter (Signed)
  Follow up Call-  Call back number 05/20/2013  Post procedure Call Back phone  # [phone]  Permission to leave phone message Yes     Patient questions:  Left a message to call if necessary.

## 2013-05-27 ENCOUNTER — Encounter: Payer: Self-pay | Admitting: Internal Medicine

## 2013-06-04 ENCOUNTER — Telehealth: Payer: Self-pay | Admitting: Internal Medicine

## 2013-06-04 NOTE — Telephone Encounter (Signed)
S/W PT IN RE TO NP APPT 09/10 @ 1:30. W/DR. MOHAMED REFERRING DR. VYAS DX- 2ND OPINION-CHRONIC LYMPHOCYTIC LEUKEMIA WELCOME PACKET MAILED.

## 2013-06-19 ENCOUNTER — Encounter: Payer: Self-pay | Admitting: Internal Medicine

## 2013-06-19 ENCOUNTER — Ambulatory Visit (HOSPITAL_BASED_OUTPATIENT_CLINIC_OR_DEPARTMENT_OTHER): Payer: BC Managed Care – PPO

## 2013-06-19 ENCOUNTER — Other Ambulatory Visit (HOSPITAL_COMMUNITY)
Admission: RE | Admit: 2013-06-19 | Discharge: 2013-06-19 | Disposition: A | Discharge status: Home / Self Care | Payer: BC Managed Care – PPO | Source: Ambulatory Visit | Attending: Internal Medicine | Admitting: Internal Medicine

## 2013-06-19 ENCOUNTER — Other Ambulatory Visit (HOSPITAL_BASED_OUTPATIENT_CLINIC_OR_DEPARTMENT_OTHER): Payer: BC Managed Care – PPO | Admitting: Lab

## 2013-06-19 ENCOUNTER — Telehealth: Payer: Self-pay | Admitting: Internal Medicine

## 2013-06-19 ENCOUNTER — Ambulatory Visit: Payer: BC Managed Care – PPO

## 2013-06-19 ENCOUNTER — Ambulatory Visit (HOSPITAL_BASED_OUTPATIENT_CLINIC_OR_DEPARTMENT_OTHER): Payer: BC Managed Care – PPO | Admitting: Internal Medicine

## 2013-06-19 ENCOUNTER — Other Ambulatory Visit: Payer: Self-pay | Admitting: Medical Oncology

## 2013-06-19 VITALS — BP 117/75 | HR 87 | Temp 97.2°F | Resp 20 | Ht 64.0 in | Wt 174.2 lb

## 2013-06-19 DIAGNOSIS — Z23 Encounter for immunization: Secondary | ICD-10-CM

## 2013-06-19 DIAGNOSIS — C9110 Chronic lymphocytic leukemia of B-cell type not having achieved remission: Secondary | ICD-10-CM

## 2013-06-19 DIAGNOSIS — D126 Benign neoplasm of colon, unspecified: Secondary | ICD-10-CM

## 2013-06-19 LAB — TECHNOLOGIST REVIEW

## 2013-06-19 LAB — COMPREHENSIVE METABOLIC PANEL (CC13)
ALT: 16 U/L (ref 0–55)
AST: 16 U/L (ref 5–34)
Albumin: 4 g/dL (ref 3.5–5.0)
Alkaline Phosphatase: 83 U/L (ref 40–150)
Glucose: 105 mg/dl (ref 70–140)
Potassium: 3.4 mEq/L — ABNORMAL LOW (ref 3.5–5.1)
Sodium: 139 mEq/L (ref 136–145)
Total Bilirubin: 0.22 mg/dL (ref 0.20–1.20)
Total Protein: 6.9 g/dL (ref 6.4–8.3)

## 2013-06-19 LAB — LACTATE DEHYDROGENASE (CC13): LDH: 154 U/L (ref 125–245)

## 2013-06-19 LAB — CBC WITH DIFFERENTIAL/PLATELET
BASO%: 0.7 % (ref 0.0–2.0)
Basophils Absolute: 0.1 10e3/uL (ref 0.0–0.1)
EOS%: 1.3 % (ref 0.0–7.0)
Eosinophils Absolute: 0.2 10e3/uL (ref 0.0–0.5)
HCT: 37.1 % (ref 34.8–46.6)
HGB: 12.3 g/dL (ref 11.6–15.9)
LYMPH%: 45.8 % (ref 14.0–49.7)
MCH: 29.3 pg (ref 25.1–34.0)
MCHC: 33.1 g/dL (ref 31.5–36.0)
MCV: 88.4 fL (ref 79.5–101.0)
MONO#: 1.2 10e3/uL — ABNORMAL HIGH (ref 0.1–0.9)
MONO%: 7.9 % (ref 0.0–14.0)
NEUT#: 6.9 10e3/uL — ABNORMAL HIGH (ref 1.5–6.5)
NEUT%: 44.3 % (ref 38.4–76.8)
Platelets: 386 10e3/uL (ref 145–400)
RBC: 4.2 10e6/uL (ref 3.70–5.45)
RDW: 14.2 % (ref 11.2–14.5)
WBC: 15.5 10e3/uL — ABNORMAL HIGH (ref 3.9–10.3)
lymph#: 7.1 10e3/uL — ABNORMAL HIGH (ref 0.9–3.3)

## 2013-06-19 MED ORDER — PNEUMOCOCCAL VAC POLYVALENT 25 MCG/0.5ML IJ INJ
0.5000 mL | INJECTION | INTRAMUSCULAR | Status: AC
  Administered 2013-06-19: 0.5 mL via INTRAMUSCULAR
  Filled 2013-06-19: qty 0.5

## 2013-06-19 MED ORDER — INFLUENZA VAC SPLIT QUAD 0.5 ML IM SUSP
0.5000 mL | INTRAMUSCULAR | Status: AC
  Administered 2013-06-19: 0.5 mL via INTRAMUSCULAR
  Filled 2013-06-19: qty 0.5

## 2013-06-19 NOTE — Progress Notes (Signed)
[facility] [phone]) [phone]   [phone]  CONSULT NOTE  REFERRING PHYSICIAN: Dr. Sherril Croon  REASON FOR CONSULTATION:  62 years old white female with chronic lymphocytic leukemia for second opinion  HPI Kimberly King is a 62 y.o. female with past medical history significant for migraine headache, history of cervical spine surgery, hypertension, history of early stage melanoma of the left Calf status post resection in addition to recently diagnosed chronic lymphocytic. The patient mentions that on routine blood work by her primary care physician in June of 2013 she was found to have elevated white blood count. She was referred to Dr. Ubaldo Glassing in [place], [place]. Repeat blood work in addition to peripheral blood flow cytometry, quantitative immunoglobulin, as well as LDH and beta-2 microglobulin were performed. The findings were consistent with chronic lymphocytic leukemia. The patient was seen every 3 months with repeat blood work and observation.  Last visit was seen by Dr.Faidas who was [facility] [place]. The patient was a little bit concerned about her diagnosis and she came today for evaluation and second opinion regarding her chronic lymphocytic leukemia. She is feeling fine today with no specific complaints except for mild peripheral neuropathy after her cervical spine surgery in addition to soreness in the lower rib cage bilaterally.  The patient denied having any significant fever or chills, no nausea or vomiting. She denied having any significant chest pain, shortness of breath, cough or hemoptysis. She has no palpable lymphadenopathy. She has no headache or blurry vision. She denied having any recurrent infection, bleeding, bruises or ecchymosis. The patient has no family history of leukemia or blood disorders but her father was diagnosed with lung cancer at age 78. She is single and has 2 children. She works in Occupational psychologist in [employer]  But she lives in [place]. The patient is a current smoker less than one half pack per day for around 43 years.  I strongly encouraged her to quit smoking and offered her smoke cessation program. She drinks alcohol occasionally with no history of drug abuse.  @SFHPI @  Past Medical History  Diagnosis Date  . Hypertension   . Leukocytosis     and low blood sodium   . Smoker     Past Surgical History  Procedure Laterality Date  . Tonsillectomy    . Appendectomy    . Dilation and curettage of uterus    . Bladder surgery      tack  . Cervical fusion      x2    Family History  Problem Relation Age of Onset  . Colon cancer Paternal Grandmother 28    Social History History  Substance Use Topics  . Smoking status: Current Every Day Smoker -- 0.25 packs/day for 30 years    Types: Cigarettes  . Smokeless tobacco: Never Used  . Alcohol Use: Yes     Comment: rare    No Known Allergies  Current Outpatient Prescriptions  Medication Sig Dispense Refill  . buPROPion (WELLBUTRIN) 100 MG tablet Take 100 mg by mouth 2 (two) times daily.      . Calcium Carbonate-Vitamin D (CALCIUM + D PO) Take 2 tablets by mouth daily.      Marland Kitchen esomeprazole (NEXIUM) 40 MG capsule Take 40 mg by mouth daily before breakfast.      . FLUTICASONE PROPIONATE, NASAL, NA Place 2 sprays into the nose daily.      Marland Kitchen lisinopril-hydrochlorothiazide (PRINZIDE,ZESTORETIC) 10-12.5  MG per tablet Take 1 tablet by mouth daily.      . pantoprazole (PROTONIX) 40 MG tablet Take 40 mg by mouth daily.      . SUMAtriptan (IMITREX) 50 MG tablet Take 50 mg by mouth every 2 (two) hours as needed for migraine.      . Chlorphen-Pseudoephed-APAP (TYLENOL ALLERGY SINUS PO) Take 1 tablet by mouth as needed.      Marland Kitchen ibuprofen (ADVIL,MOTRIN) 200 MG tablet Take 400 mg by mouth every 6 (six) hours as needed for pain.      . Naproxen Sodium (ALEVE PO) Take 2 tablets by mouth as needed.      .  triamcinolone cream (KENALOG) 0.1 %        No current facility-administered medications for this visit.    Review of Systems  Constitutional: positive for fatigue Eyes: negative Ears, nose, mouth, throat, and face: negative Respiratory: negative Cardiovascular: negative Gastrointestinal: negative Genitourinary:negative Integument/breast: negative Hematologic/lymphatic: negative Musculoskeletal:positive for arthralgias Neurological: negative Behavioral/Psych: negative Endocrine: negative Allergic/Immunologic: negative  Physical Exam  ZOX:WRUEA, healthy, no distress, well nourished and well developed SKIN: skin color, texture, turgor are normal HEAD: Normocephalic, No masses, lesions, tenderness or abnormalities EYES: normal, PERRLA EARS: External ears normal OROPHARYNX:no exudate and no erythema  NECK: supple, no adenopathy LYMPH:  no palpable lymphadenopathy in the neck, supraclavicular, infraclavicular, axillary or inguinal area, no hepatosplenomegaly BREAST:not examined LUNGS: clear to auscultation with no wheezes or crackles. HEART: regular rate & rhythm, no murmurs and no gallops ABDOMEN:abdomen soft, non-tender, normal bowel sounds and no masses or organomegaly BACK: Back symmetric, no curvature. EXTREMITIES:no joint deformities, effusion, or inflammation, no edema, no skin discoloration  NEURO: alert & oriented x 3 with fluent speech, no focal motor/sensory deficits  PERFORMANCE STATUS: ECOG 1  LABORATORY DATA: Lab Results  Component Value Date   WBC 15.5* 06/19/2013   HGB 12.3 06/19/2013   HCT 37.1 06/19/2013   MCV 88.4 06/19/2013   PLT 386 06/19/2013      Chemistry      Component Value Date/Time   NA 139 06/19/2013 1338   K 3.4* 06/19/2013 1338   CO2 27 06/19/2013 1338   BUN 17.2 06/19/2013 1338   CREATININE 0.8 06/19/2013 1338      Component Value Date/Time   CALCIUM 9.5 06/19/2013 1338   ALKPHOS 83 06/19/2013 1338   AST 16 06/19/2013 1338   ALT 16  06/19/2013 1338   BILITOT 0.22 06/19/2013 1338       RADIOGRAPHIC STUDIES: No results found.  ASSESSMENT: This is a very pleasant 62 years old white female with recently diagnosed chronic lymphocytic leukemia, stage 0, presenting mainly with mild lymphocytosis was no palpable lymphadenopathy or organomegaly.   PLAN: I had a lengthy discussion with the patient today about her current disease stage, prognosis and treatment options. I ordered several studies today for further evaluation of her condition including repeat CBC, comprehensive metabolic panel, LDH as well as flow cytometry for the peripheral blood. I will continue the patient on observation for now with repeat CBC, comprehensive metabolic panel and LDH in 6 months. I discussed with the patient the criteria for consideration of treatment including short doubling time of her white blood count in less than 6 months, significant lymphadenopathy, significant organomegaly, and anemia or thrombocytopenia. The patient also requested a flu vaccine in addition to a pneumonia vaccine today and this will be given to her in the clinic. I gave the patient the time to ask questions and I  answered them completely to her satisfaction. The patient voices understanding of current disease status and treatment options and is in agreement with the current care plan.  All questions were answered. The patient knows to call the clinic with any problems, questions or concerns. We can certainly see the patient much sooner if necessary.  Thank you so much for allowing me to participate in the care of Kimberly King. I will continue to follow up the patient with you and assist in her care.  I spent 40 minutes counseling the patient face to face. The total time spent in the appointment was 55 minutes.  Maliq Pilley K. 06/19/2013, 9:29 PM

## 2013-06-19 NOTE — Progress Notes (Signed)
Checked in new pt with no financial concerns. °

## 2013-06-19 NOTE — Telephone Encounter (Signed)
gv and printed appt sched and avs forpt for March 2015  °

## 2013-08-02 ENCOUNTER — Other Ambulatory Visit (HOSPITAL_COMMUNITY): Payer: Self-pay | Admitting: Obstetrics

## 2013-08-02 DIAGNOSIS — Z1231 Encounter for screening mammogram for malignant neoplasm of breast: Secondary | ICD-10-CM

## 2013-08-13 ENCOUNTER — Ambulatory Visit (HOSPITAL_COMMUNITY)
Admission: RE | Admit: 2013-08-13 | Discharge: 2013-08-13 | Disposition: A | Discharge status: Home / Self Care | Payer: BC Managed Care – PPO | Source: Ambulatory Visit | Attending: Obstetrics | Admitting: Obstetrics

## 2013-08-13 DIAGNOSIS — Z1231 Encounter for screening mammogram for malignant neoplasm of breast: Secondary | ICD-10-CM

## 2013-12-17 ENCOUNTER — Telehealth: Payer: Self-pay | Admitting: Internal Medicine

## 2013-12-17 ENCOUNTER — Encounter: Payer: Self-pay | Admitting: Internal Medicine

## 2013-12-17 ENCOUNTER — Other Ambulatory Visit (HOSPITAL_BASED_OUTPATIENT_CLINIC_OR_DEPARTMENT_OTHER): Payer: BC Managed Care – PPO

## 2013-12-17 ENCOUNTER — Ambulatory Visit (HOSPITAL_BASED_OUTPATIENT_CLINIC_OR_DEPARTMENT_OTHER): Payer: BC Managed Care – PPO | Admitting: Internal Medicine

## 2013-12-17 VITALS — BP 114/72 | HR 70 | Temp 98.7°F | Resp 18 | Ht 64.0 in | Wt 168.4 lb

## 2013-12-17 DIAGNOSIS — C9110 Chronic lymphocytic leukemia of B-cell type not having achieved remission: Secondary | ICD-10-CM | POA: Insufficient documentation

## 2013-12-17 LAB — CBC WITH DIFFERENTIAL/PLATELET
BASO%: 0.3 % (ref 0.0–2.0)
Basophils Absolute: 0 10*3/uL (ref 0.0–0.1)
EOS%: 0.8 % (ref 0.0–7.0)
Eosinophils Absolute: 0.1 10*3/uL (ref 0.0–0.5)
HEMATOCRIT: 38.2 % (ref 34.8–46.6)
HGB: 12.2 g/dL (ref 11.6–15.9)
LYMPH%: 60.4 % — AB (ref 14.0–49.7)
MCH: 28.4 pg (ref 25.1–34.0)
MCHC: 31.9 g/dL (ref 31.5–36.0)
MCV: 89.1 fL (ref 79.5–101.0)
MONO#: 0.9 10*3/uL (ref 0.1–0.9)
MONO%: 5.8 % (ref 0.0–14.0)
NEUT#: 4.9 10*3/uL (ref 1.5–6.5)
NEUT%: 32.7 % — AB (ref 38.4–76.8)
PLATELETS: 372 10*3/uL (ref 145–400)
RBC: 4.29 10*6/uL (ref 3.70–5.45)
RDW: 14.7 % — ABNORMAL HIGH (ref 11.2–14.5)
WBC: 15 10*3/uL — AB (ref 3.9–10.3)
lymph#: 9 10*3/uL — ABNORMAL HIGH (ref 0.9–3.3)

## 2013-12-17 LAB — COMPREHENSIVE METABOLIC PANEL (CC13)
ALT: 15 U/L (ref 0–55)
AST: 16 U/L (ref 5–34)
Albumin: 4.4 g/dL (ref 3.5–5.0)
Alkaline Phosphatase: 76 U/L (ref 40–150)
Anion Gap: 8 mEq/L (ref 3–11)
BILIRUBIN TOTAL: 0.26 mg/dL (ref 0.20–1.20)
BUN: 14.1 mg/dL (ref 7.0–26.0)
CO2: 29 mEq/L (ref 22–29)
CREATININE: 0.9 mg/dL (ref 0.6–1.1)
Calcium: 10 mg/dL (ref 8.4–10.4)
Chloride: 101 mEq/L (ref 98–109)
Glucose: 101 mg/dl (ref 70–140)
Potassium: 3.9 mEq/L (ref 3.5–5.1)
Sodium: 138 mEq/L (ref 136–145)
Total Protein: 7.1 g/dL (ref 6.4–8.3)

## 2013-12-17 LAB — LACTATE DEHYDROGENASE (CC13): LDH: 149 U/L (ref 125–245)

## 2013-12-17 LAB — TECHNOLOGIST REVIEW

## 2013-12-17 NOTE — Patient Instructions (Signed)
Smoking Cessation, Tips for Success If you are ready to quit smoking, congratulations! You have chosen to help yourself be healthier. Cigarettes bring nicotine, tar, carbon monoxide, and other irritants into your body. Your lungs, heart, and blood vessels will be able to work better without these poisons. There are many different ways to quit smoking. Nicotine gum, nicotine patches, a nicotine inhaler, or nicotine nasal spray can help with physical craving. Hypnosis, support groups, and medicines help break the habit of smoking. WHAT THINGS CAN I DO TO MAKE QUITTING EASIER?  Here are some tips to help you quit for good:  Pick a date when you will quit smoking completely. Tell all of your friends and family about your plan to quit on that date.  Do not try to slowly cut down on the number of cigarettes you are smoking. Pick a quit date and quit smoking completely starting on that day.  Throw away all cigarettes.   Clean and remove all ashtrays from your home, work, and car.   On a card, write down your reasons for quitting. Carry the card with you and read it when you get the urge to smoke.   Cleanse your body of nicotine. Drink enough water and fluids to keep your urine clear or pale yellow. Do this after quitting to flush the nicotine from your body.   Learn to predict your moods. Do not let a bad situation be your excuse to have a cigarette. Some situations in your life might tempt you into wanting a cigarette.   Never have "just one" cigarette. It leads to wanting another and another. Remind yourself of your decision to quit.   Change habits associated with smoking. If you smoked while driving or when feeling stressed, try other activities to replace smoking. Stand up when drinking your coffee. Brush your teeth after eating. Sit in a different chair when you read the paper. Avoid alcohol while trying to quit, and try to drink fewer caffeinated beverages. Alcohol and caffeine may urge  you to smoke.   Avoid foods and drinks that can trigger a desire to smoke, such as sugary or spicy foods and alcohol.   Ask people who smoke not to smoke around you.   Have something planned to do right after eating or having a cup of coffee. For example, plan to take a walk or exercise.   Try a relaxation exercise to calm you down and decrease your stress. Remember, you may be tense and nervous for the first 2 weeks after you quit, but this will pass.   Find new activities to keep your hands busy. Play with a pen, coin, or rubber band. Doodle or draw things on paper.   Brush your teeth right after eating. This will help cut down on the craving for the taste of tobacco after meals. You can also try mouthwash.   Use oral substitutes in place of cigarettes. Try using lemon drops, carrots, cinnamon sticks, or chewing gum. Keep them handy so they are available when you have the urge to smoke.   When you have the urge to smoke, try deep breathing.   Designate your home as a nonsmoking area.   If you are a heavy smoker, ask your health care provider about a prescription for nicotine chewing gum. It can ease your withdrawal from nicotine.   Reward yourself. Set aside the cigarette money you save and buy yourself something nice.   Look for support from others. Join a support group or   smoking cessation program. Ask someone at home or at work to help you with your plan to quit smoking.   Always ask yourself, "Do I need this cigarette or is this just a reflex?" Tell yourself, "Today, I choose not to smoke," or "I do not want to smoke." You are reminding yourself of your decision to quit.  Do not replace cigarette smoking with electronic cigarettes (commonly called e-cigarettes). The safety of e-cigarettes is unknown, and some may contain harmful chemicals.  If you relapse, do not give up! Plan ahead and think about what you will do the next time you get the urge to smoke.  HOW WILL  I FEEL WHEN I QUIT SMOKING? You may have symptoms of withdrawal because your body is used to nicotine (the addictive substance in cigarettes). You may crave cigarettes, be irritable, feel very hungry, cough often, get headaches, or have difficulty concentrating. The withdrawal symptoms are only temporary. They are strongest when you first quit but will go away within 10 14 days. When withdrawal symptoms occur, stay in control. Think about your reasons for quitting. Remind yourself that these are signs that your body is healing and getting used to being without cigarettes. Remember that withdrawal symptoms are easier to treat than the major diseases that smoking can cause.  Even after the withdrawal is over, expect periodic urges to smoke. However, these cravings are generally short lived and will go away whether you smoke or not. Do not smoke!  WHAT RESOURCES ARE AVAILABLE TO HELP ME QUIT SMOKING? Your health care provider can direct you to community resources or hospitals for support, which may include:  Group support.  Education.  Hypnosis.  Therapy. Document Released: 06/24/2004 Document Revised: 07/17/2013 Document Reviewed: 03/14/2013 [employer] Patient Information 2014 [employer].  

## 2013-12-17 NOTE — Telephone Encounter (Signed)
gv adn printed appt sched and avs for pt for SEpt °

## 2013-12-17 NOTE — Progress Notes (Signed)
[facility] [phone]   Fax:(336) [phone]  OFFICE PROGRESS NOTE  VYAS,DHRUV B., MD [place] [id number]  DIAGNOSIS: Stage 0 chronic lymphocytic leukemia  PRIOR THERAPY: None  CURRENT THERAPY: Observation  INTERVAL HISTORY: Kimberly King 63 y.o. female returns to the clinic today for six-month followup visit. The patient is feeling fine today with no specific complaints. She denied having any significant weight loss or night sweats. She has no chest pain, shortness of breath, cough or hemoptysis. She denied having any significant nausea or vomiting, no fever or chills. She has no palpable lymphadenopathy. She had repeat CBC performed earlier today and she is here for evaluation and discussion of her lab results.  MEDICAL HISTORY: Past Medical History  Diagnosis Date  . Hypertension   . Leukocytosis     and low blood sodium   . Smoker     ALLERGIES:  has No Known Allergies.  MEDICATIONS:  Current Outpatient Prescriptions  Medication Sig Dispense Refill  . Calcium Carbonate-Vitamin D (CALCIUM + D PO) Take 2 tablets by mouth daily.      . Chlorphen-Pseudoephed-APAP (TYLENOL ALLERGY SINUS PO) Take 1 tablet by mouth as needed.      Marland Kitchen FLUTICASONE PROPIONATE, NASAL, NA Place 2 sprays into the nose daily.      Marland Kitchen ibuprofen (ADVIL,MOTRIN) 200 MG tablet Take 400 mg by mouth every 6 (six) hours as needed for pain.      Marland Kitchen lisinopril-hydrochlorothiazide (PRINZIDE,ZESTORETIC) 10-12.5 MG per tablet Take 1 tablet by mouth daily.      . Naproxen Sodium (ALEVE PO) Take 2 tablets by mouth as needed.      . pantoprazole (PROTONIX) 40 MG tablet Take 40 mg by mouth daily.      . SUMAtriptan (IMITREX) 50 MG tablet Take 50 mg by mouth every 2 (two) hours as needed for migraine.       No current facility-administered medications for this visit.    SURGICAL HISTORY:  Past Surgical History  Procedure Laterality Date  . Tonsillectomy    . Appendectomy     . Dilation and curettage of uterus    . Bladder surgery      tack  . Cervical fusion      x2    REVIEW OF SYSTEMS:  A comprehensive review of systems was negative.   PHYSICAL EXAMINATION: General appearance: alert, cooperative and no distress Head: Normocephalic, without obvious abnormality, atraumatic Neck: no adenopathy, no JVD, supple, symmetrical, trachea midline and thyroid not enlarged, symmetric, no tenderness/mass/nodules Lymph nodes: Cervical, supraclavicular, and axillary nodes normal. Resp: clear to auscultation bilaterally Back: symmetric, no curvature. ROM normal. No CVA tenderness. Cardio: regular rate and rhythm, S1, S2 normal, no murmur, click, rub or gallop GI: soft, non-tender; bowel sounds normal; no masses,  no organomegaly Extremities: extremities normal, atraumatic, no cyanosis or edema  ECOG PERFORMANCE STATUS: 0 - Asymptomatic  Blood pressure 114/72, pulse 70, temperature 98.7 F (37.1 C), temperature source Oral, resp. rate 18, height 5\' 4"  (1.626 m), weight 168 lb 6.4 oz (76.386 kg), SpO2 100.00%.  LABORATORY DATA: Lab Results  Component Value Date   WBC 15.0* 12/17/2013   HGB 12.2 12/17/2013   HCT 38.2 12/17/2013   MCV 89.1 12/17/2013   PLT 372 12/17/2013      Chemistry      Component Value Date/Time   NA 139 06/19/2013 1338   K 3.4* 06/19/2013 1338   CO2 27 06/19/2013 1338  BUN 17.2 06/19/2013 1338   CREATININE 0.8 06/19/2013 1338      Component Value Date/Time   CALCIUM 9.5 06/19/2013 1338   ALKPHOS 83 06/19/2013 1338   AST 16 06/19/2013 1338   ALT 16 06/19/2013 1338   BILITOT 0.22 06/19/2013 1338       RADIOGRAPHIC STUDIES: No results found.  ASSESSMENT AND PLAN: This is a very pleasant 63 years old white female with stage 0 chronic lymphocytic leukemia currently on observation. Her CBC today showed no evidence for disease progression.  I discussed the lab result with the patient and recommended for her to continue on observation for now  with repeat CBC, comprehensive metabolic panel and LDH in 6 months. She was advised to call immediately if she has any concerning symptoms in the interval.  The patient voices understanding of current disease status and treatment options and is in agreement with the current care plan.  All questions were answered. The patient knows to call the clinic with any problems, questions or concerns. We can certainly see the patient much sooner if necessary.  Disclaimer: This note was dictated with voice recognition software. Similar sounding words can inadvertently be transcribed and may not be corrected upon review.

## 2014-06-24 ENCOUNTER — Ambulatory Visit (HOSPITAL_BASED_OUTPATIENT_CLINIC_OR_DEPARTMENT_OTHER): Payer: BC Managed Care – PPO | Admitting: Internal Medicine

## 2014-06-24 ENCOUNTER — Ambulatory Visit (HOSPITAL_BASED_OUTPATIENT_CLINIC_OR_DEPARTMENT_OTHER): Payer: BC Managed Care – PPO

## 2014-06-24 ENCOUNTER — Other Ambulatory Visit: Payer: BC Managed Care – PPO

## 2014-06-24 ENCOUNTER — Encounter: Payer: Self-pay | Admitting: Internal Medicine

## 2014-06-24 ENCOUNTER — Other Ambulatory Visit: Payer: Self-pay | Admitting: *Deleted

## 2014-06-24 VITALS — BP 127/79 | HR 80 | Temp 98.5°F | Resp 18 | Ht 64.0 in | Wt 167.9 lb

## 2014-06-24 DIAGNOSIS — C9110 Chronic lymphocytic leukemia of B-cell type not having achieved remission: Secondary | ICD-10-CM

## 2014-06-24 LAB — COMPREHENSIVE METABOLIC PANEL (CC13)
ALBUMIN: 4.4 g/dL (ref 3.5–5.0)
ALK PHOS: 96 U/L (ref 40–150)
ALT: 15 U/L (ref 0–55)
AST: 14 U/L (ref 5–34)
Anion Gap: 9 mEq/L (ref 3–11)
BUN: 14.6 mg/dL (ref 7.0–26.0)
CO2: 24 mEq/L (ref 22–29)
Calcium: 9.7 mg/dL (ref 8.4–10.4)
Chloride: 105 mEq/L (ref 98–109)
Creatinine: 0.8 mg/dL (ref 0.6–1.1)
Glucose: 91 mg/dl (ref 70–140)
POTASSIUM: 3.8 meq/L (ref 3.5–5.1)
SODIUM: 138 meq/L (ref 136–145)
Total Bilirubin: 0.21 mg/dL (ref 0.20–1.20)
Total Protein: 7.5 g/dL (ref 6.4–8.3)

## 2014-06-24 LAB — CBC WITH DIFFERENTIAL/PLATELET
BASO%: 0.6 % (ref 0.0–2.0)
BASOS ABS: 0.1 10*3/uL (ref 0.0–0.1)
EOS%: 1.9 % (ref 0.0–7.0)
Eosinophils Absolute: 0.3 10*3/uL (ref 0.0–0.5)
HCT: 40.3 % (ref 34.8–46.6)
HGB: 12.7 g/dL (ref 11.6–15.9)
LYMPH%: 59.5 % — ABNORMAL HIGH (ref 14.0–49.7)
MCH: 28.5 pg (ref 25.1–34.0)
MCHC: 31.5 g/dL (ref 31.5–36.0)
MCV: 90.4 fL (ref 79.5–101.0)
MONO#: 1.1 10*3/uL — ABNORMAL HIGH (ref 0.1–0.9)
MONO%: 7 % (ref 0.0–14.0)
NEUT#: 4.9 10*3/uL (ref 1.5–6.5)
NEUT%: 31 % — ABNORMAL LOW (ref 38.4–76.8)
Platelets: 390 10*3/uL (ref 145–400)
RBC: 4.46 10*6/uL (ref 3.70–5.45)
RDW: 14.1 % (ref 11.2–14.5)
WBC: 15.7 10*3/uL — ABNORMAL HIGH (ref 3.9–10.3)
lymph#: 9.4 10*3/uL — ABNORMAL HIGH (ref 0.9–3.3)

## 2014-06-24 LAB — TECHNOLOGIST REVIEW

## 2014-06-24 LAB — LACTATE DEHYDROGENASE (CC13): LDH: 149 U/L (ref 125–245)

## 2014-06-24 NOTE — Patient Instructions (Signed)
Smoking Cessation Quitting smoking is important to your health and has many advantages. However, it is not always easy to quit since nicotine is a very addictive drug. Oftentimes, people try 3 times or more before being able to quit. This document explains the best ways for you to prepare to quit smoking. Quitting takes hard work and a lot of effort, but you can do it. ADVANTAGES OF QUITTING SMOKING  You will live longer, feel better, and live better.  Your body will feel the impact of quitting smoking almost immediately.  Within 20 minutes, blood pressure decreases. Your pulse returns to its normal level.  After 8 hours, carbon monoxide levels in the blood return to normal. Your oxygen level increases.  After 24 hours, the chance of having a heart attack starts to decrease. Your breath, hair, and body stop smelling like smoke.  After 48 hours, damaged nerve endings begin to recover. Your sense of taste and smell improve.  After 72 hours, the body is virtually free of nicotine. Your bronchial tubes relax and breathing becomes easier.  After 2 to 12 weeks, lungs can hold more air. Exercise becomes easier and circulation improves.  The risk of having a heart attack, stroke, cancer, or lung disease is greatly reduced.  After 1 year, the risk of coronary heart disease is cut in half.  After 5 years, the risk of stroke falls to the same as a nonsmoker.  After 10 years, the risk of lung cancer is cut in half and the risk of other cancers decreases significantly.  After 15 years, the risk of coronary heart disease drops, usually to the level of a nonsmoker.  If you are pregnant, quitting smoking will improve your chances of having a healthy baby.  The people you live with, especially any children, will be healthier.  You will have extra money to spend on things other than cigarettes. QUESTIONS TO THINK ABOUT BEFORE ATTEMPTING TO QUIT You may want to talk about your answers with your  health care provider.  Why do you want to quit?  If you tried to quit in the past, what helped and what did not?  What will be the most difficult situations for you after you quit? How will you plan to handle them?  Who can help you through the tough times? Your family? Friends? A health care provider?  What pleasures do you get from smoking? What ways can you still get pleasure if you quit? Here are some questions to ask your health care provider:  How can you help me to be successful at quitting?  What medicine do you think would be best for me and how should I take it?  What should I do if I need more help?  What is smoking withdrawal like? How can I get information on withdrawal? GET READY  Set a quit date.  Change your environment by getting rid of all cigarettes, ashtrays, matches, and lighters in your home, car, or work. Do not let people smoke in your home.  Review your past attempts to quit. Think about what worked and what did not. GET SUPPORT AND ENCOURAGEMENT You have a better chance of being successful if you have help. You can get support in many ways.  Tell your family, friends, and coworkers that you are going to quit and need their support. Ask them not to smoke around you.  Get individual, group, or telephone counseling and support. Programs are available at local hospitals and health centers. Call   your local health department for information about programs in your area.  Spiritual beliefs and practices may help some smokers quit.  Download a "quit meter" on your computer to keep track of quit statistics, such as how long you have gone without smoking, cigarettes not smoked, and money saved.  Get a self-help book about quitting smoking and staying off tobacco. LEARN NEW SKILLS AND BEHAVIORS  Distract yourself from urges to smoke. Talk to someone, go for a walk, or occupy your time with a task.  Change your normal routine. Take a different route to work.  Drink tea instead of coffee. Eat breakfast in a different place.  Reduce your stress. Take a hot bath, exercise, or read a book.  Plan something enjoyable to do every day. Reward yourself for not smoking.  Explore interactive web-based programs that specialize in helping you quit. GET MEDICINE AND USE IT CORRECTLY Medicines can help you stop smoking and decrease the urge to smoke. Combining medicine with the above behavioral methods and support can greatly increase your chances of successfully quitting smoking.  Nicotine replacement therapy helps deliver nicotine to your body without the negative effects and risks of smoking. Nicotine replacement therapy includes nicotine gum, lozenges, inhalers, nasal sprays, and skin patches. Some may be available over-the-counter and others require a prescription.  Antidepressant medicine helps people abstain from smoking, but how this works is unknown. This medicine is available by prescription.  Nicotinic receptor partial agonist medicine simulates the effect of nicotine in your brain. This medicine is available by prescription. Ask your health care provider for advice about which medicines to use and how to use them based on your health history. Your health care provider will tell you what side effects to look out for if you choose to be on a medicine or therapy. Carefully read the information on the package. Do not use any other product containing nicotine while using a nicotine replacement product.  RELAPSE OR DIFFICULT SITUATIONS Most relapses occur within the first 3 months after quitting. Do not be discouraged if you start smoking again. Remember, most people try several times before finally quitting. You may have symptoms of withdrawal because your body is used to nicotine. You may crave cigarettes, be irritable, feel very hungry, cough often, get headaches, or have difficulty concentrating. The withdrawal symptoms are only temporary. They are strongest  when you first quit, but they will go away within 10-14 days. To reduce the chances of relapse, try to:  Avoid drinking alcohol. Drinking lowers your chances of successfully quitting.  Reduce the amount of caffeine you consume. Once you quit smoking, the amount of caffeine in your body increases and can give you symptoms, such as a rapid heartbeat, sweating, and anxiety.  Avoid smokers because they can make you want to smoke.  Do not let weight gain distract you. Many smokers will gain weight when they quit, usually less than 10 pounds. Eat a healthy diet and stay active. You can always lose the weight gained after you quit.  Find ways to improve your mood other than smoking. FOR MORE INFORMATION  www.smokefree.gov  Document Released: 09/20/2001 Document Revised: 02/10/2014 Document Reviewed: 01/05/2012 [employer] Patient Information 2015 [employer]. This information is not intended to replace advice given to you by your health care provider. Make sure you discuss any questions you have with your health care provider.  

## 2014-06-24 NOTE — Progress Notes (Signed)
[facility] Telephone:([phone]   Fax:(336) [phone]  OFFICE PROGRESS NOTE  VYAS,DHRUV B., MD [place] [address]  DIAGNOSIS: Stage 0 chronic lymphocytic leukemia  PRIOR THERAPY: None  CURRENT THERAPY: Observation  INTERVAL HISTORY: Kimberly King 63 y.o. female returns to the clinic today for six-month followup visit. The patient has been observation and doing very well. She denied having any significant weight loss or night sweats. She has no chest pain, shortness of breath, cough or hemoptysis. She denied having any significant nausea or vomiting, no fever or chills. She has no palpable lymphadenopathy. She had repeat CBC performed earlier today and she is here for evaluation and discussion of her lab results.  MEDICAL HISTORY: Past Medical History  Diagnosis Date  . Hypertension   . Leukocytosis     and low blood sodium   . Smoker     ALLERGIES:  has No Known Allergies.  MEDICATIONS:  Current Outpatient Prescriptions  Medication Sig Dispense Refill  . Calcium Carbonate-Vitamin D (CALCIUM + D PO) Take 2 tablets by mouth daily.      . Chlorphen-Pseudoephed-APAP (TYLENOL ALLERGY SINUS PO) Take 1 tablet by mouth as needed.      Marland Kitchen FLUTICASONE PROPIONATE, NASAL, NA Place 2 sprays into the nose daily.      Marland Kitchen lisinopril-hydrochlorothiazide (PRINZIDE,ZESTORETIC) 10-12.5 MG per tablet Take 1 tablet by mouth daily.      . Naproxen Sodium (ALEVE PO) Take 2 tablets by mouth as needed.      . pantoprazole (PROTONIX) 40 MG tablet Take 40 mg by mouth daily.      . SUMAtriptan (IMITREX) 50 MG tablet Take 50 mg by mouth every 2 (two) hours as needed for migraine.      . traMADol (ULTRAM) 50 MG tablet Take by mouth every 6 (six) hours as needed.      Marland Kitchen ibuprofen (ADVIL,MOTRIN) 200 MG tablet Take 400 mg by mouth every 6 (six) hours as needed for pain.       No current facility-administered medications for this visit.    SURGICAL HISTORY:  Past  Surgical History  Procedure Laterality Date  . Tonsillectomy    . Appendectomy    . Dilation and curettage of uterus    . Bladder surgery      tack  . Cervical fusion      x2    REVIEW OF SYSTEMS:  A comprehensive review of systems was negative.   PHYSICAL EXAMINATION: General appearance: alert, cooperative and no distress Head: Normocephalic, without obvious abnormality, atraumatic Neck: no adenopathy, no JVD, supple, symmetrical, trachea midline and thyroid not enlarged, symmetric, no tenderness/mass/nodules Lymph nodes: Cervical, supraclavicular, and axillary nodes normal. Resp: clear to auscultation bilaterally Back: symmetric, no curvature. ROM normal. No CVA tenderness. Cardio: regular rate and rhythm, S1, S2 normal, no murmur, click, rub or gallop GI: soft, non-tender; bowel sounds normal; no masses,  no organomegaly Extremities: extremities normal, atraumatic, no cyanosis or edema  ECOG PERFORMANCE STATUS: 0 - Asymptomatic  Blood pressure 127/79, pulse 80, temperature 98.5 F (36.9 C), temperature source Oral, resp. rate 18, height 5\' 4"  (1.626 m), weight 167 lb 14.4 oz (76.159 kg), SpO2 100.00%.  LABORATORY DATA: Lab Results  Component Value Date   WBC 15.7* 06/24/2014   HGB 12.7 06/24/2014   HCT 40.3 06/24/2014   MCV 90.4 06/24/2014   PLT 390 06/24/2014      Chemistry      Component Value Date/Time  NA 138 06/24/2014 1537   K 3.8 06/24/2014 1537   CO2 24 06/24/2014 1537   BUN 14.6 06/24/2014 1537   CREATININE 0.8 06/24/2014 1537      Component Value Date/Time   CALCIUM 10.0 12/17/2013 1439   ALKPHOS 76 12/17/2013 1439   AST 16 12/17/2013 1439   ALT 15 12/17/2013 1439   BILITOT 0.26 12/17/2013 1439       RADIOGRAPHIC STUDIES: No results found.  ASSESSMENT AND PLAN: This is a very pleasant 63 years old white female with stage 0 chronic lymphocytic leukemia currently on observation. Her CBC today showed no evidence for disease progression.  Her CBC showed stable  white blood count. I discussed the lab result with the patient and recommended for her to continue on observation for now with repeat CBC, comprehensive metabolic panel and LDH in 6 months. She was advised to call immediately if she has any concerning symptoms in the interval.  The patient voices understanding of current disease status and treatment options and is in agreement with the current care plan.  All questions were answered. The patient knows to call the clinic with any problems, questions or concerns. We can certainly see the patient much sooner if necessary.  Disclaimer: This note was dictated with voice recognition software. Similar sounding words can inadvertently be transcribed and may not be corrected upon review.

## 2014-07-12 ENCOUNTER — Ambulatory Visit (INDEPENDENT_AMBULATORY_CARE_PROVIDER_SITE_OTHER): Payer: BC Managed Care – PPO | Admitting: *Deleted

## 2014-07-12 DIAGNOSIS — Z23 Encounter for immunization: Secondary | ICD-10-CM

## 2014-07-22 ENCOUNTER — Other Ambulatory Visit (HOSPITAL_COMMUNITY): Payer: Self-pay | Admitting: Obstetrics

## 2014-07-22 DIAGNOSIS — Z1231 Encounter for screening mammogram for malignant neoplasm of breast: Secondary | ICD-10-CM

## 2014-08-18 ENCOUNTER — Ambulatory Visit (HOSPITAL_COMMUNITY)
Admission: RE | Admit: 2014-08-18 | Discharge: 2014-08-18 | Disposition: A | Discharge status: Home / Self Care | Payer: BC Managed Care – PPO | Source: Ambulatory Visit | Attending: Obstetrics | Admitting: Obstetrics

## 2014-08-18 DIAGNOSIS — Z1231 Encounter for screening mammogram for malignant neoplasm of breast: Secondary | ICD-10-CM | POA: Diagnosis present

## 2014-12-23 ENCOUNTER — Telehealth: Payer: Self-pay | Admitting: Internal Medicine

## 2014-12-23 ENCOUNTER — Encounter: Payer: Self-pay | Admitting: Internal Medicine

## 2014-12-23 ENCOUNTER — Ambulatory Visit (HOSPITAL_BASED_OUTPATIENT_CLINIC_OR_DEPARTMENT_OTHER): Payer: BLUE CROSS/BLUE SHIELD | Admitting: Internal Medicine

## 2014-12-23 ENCOUNTER — Other Ambulatory Visit (HOSPITAL_BASED_OUTPATIENT_CLINIC_OR_DEPARTMENT_OTHER): Payer: BLUE CROSS/BLUE SHIELD

## 2014-12-23 VITALS — BP 148/80 | HR 86 | Temp 98.3°F | Resp 18 | Ht 64.0 in | Wt 179.9 lb

## 2014-12-23 DIAGNOSIS — C9110 Chronic lymphocytic leukemia of B-cell type not having achieved remission: Secondary | ICD-10-CM

## 2014-12-23 LAB — CBC WITH DIFFERENTIAL/PLATELET
BASO%: 0.4 % (ref 0.0–2.0)
Basophils Absolute: 0.1 10*3/uL (ref 0.0–0.1)
EOS%: 1.3 % (ref 0.0–7.0)
Eosinophils Absolute: 0.2 10*3/uL (ref 0.0–0.5)
HCT: 35.3 % (ref 34.8–46.6)
HGB: 11.5 g/dL — ABNORMAL LOW (ref 11.6–15.9)
LYMPH%: 56.6 % — ABNORMAL HIGH (ref 14.0–49.7)
MCH: 29.6 pg (ref 25.1–34.0)
MCHC: 32.6 g/dL (ref 31.5–36.0)
MCV: 90.7 fL (ref 79.5–101.0)
MONO#: 1.3 10*3/uL — ABNORMAL HIGH (ref 0.1–0.9)
MONO%: 7.2 % (ref 0.0–14.0)
NEUT%: 34.5 % — ABNORMAL LOW (ref 38.4–76.8)
NEUTROS ABS: 6 10*3/uL (ref 1.5–6.5)
Platelets: 344 10*3/uL (ref 145–400)
RBC: 3.89 10*6/uL (ref 3.70–5.45)
RDW: 14.3 % (ref 11.2–14.5)
WBC: 17.3 10*3/uL — AB (ref 3.9–10.3)
lymph#: 9.8 10*3/uL — ABNORMAL HIGH (ref 0.9–3.3)

## 2014-12-23 LAB — COMPREHENSIVE METABOLIC PANEL (CC13)
ALK PHOS: 79 U/L (ref 40–150)
ALT: 17 U/L (ref 0–55)
AST: 15 U/L (ref 5–34)
Albumin: 3.9 g/dL (ref 3.5–5.0)
Anion Gap: 8 mEq/L (ref 3–11)
BUN: 15.7 mg/dL (ref 7.0–26.0)
CO2: 24 mEq/L (ref 22–29)
CREATININE: 0.7 mg/dL (ref 0.6–1.1)
Calcium: 9.1 mg/dL (ref 8.4–10.4)
Chloride: 110 mEq/L — ABNORMAL HIGH (ref 98–109)
EGFR: >90 mL/min/{1.73_m2} (ref >90)
Glucose: 94 mg/dl (ref 70–140)
Potassium: 3.7 mEq/L (ref 3.5–5.1)
Sodium: 142 mEq/L (ref 136–145)
Total Protein: 6.5 g/dL (ref 6.4–8.3)

## 2014-12-23 LAB — TECHNOLOGIST REVIEW

## 2014-12-23 LAB — LACTATE DEHYDROGENASE (CC13): LDH: 152 U/L (ref 125–245)

## 2014-12-23 NOTE — Patient Instructions (Signed)
Smoking Cessation Quitting smoking is important to your health and has many advantages. However, it is not always easy to quit since nicotine is a very addictive drug. Oftentimes, people try 3 times or more before being able to quit. This document explains the best ways for you to prepare to quit smoking. Quitting takes hard work and a lot of effort, but you can do it. ADVANTAGES OF QUITTING SMOKING  You will live longer, feel better, and live better.  Your body will feel the impact of quitting smoking almost immediately.  Within 20 minutes, blood pressure decreases. Your pulse returns to its normal level.  After 8 hours, carbon monoxide levels in the blood return to normal. Your oxygen level increases.  After 24 hours, the chance of having a heart attack starts to decrease. Your breath, hair, and body stop smelling like smoke.  After 48 hours, damaged nerve endings begin to recover. Your sense of taste and smell improve.  After 72 hours, the body is virtually free of nicotine. Your bronchial tubes relax and breathing becomes easier.  After 2 to 12 weeks, lungs can hold more air. Exercise becomes easier and circulation improves.  The risk of having a heart attack, stroke, cancer, or lung disease is greatly reduced.  After 1 year, the risk of coronary heart disease is cut in half.  After 5 years, the risk of stroke falls to the same as a nonsmoker.  After 10 years, the risk of lung cancer is cut in half and the risk of other cancers decreases significantly.  After 15 years, the risk of coronary heart disease drops, usually to the level of a nonsmoker.  If you are pregnant, quitting smoking will improve your chances of having a healthy baby.  The people you live with, especially any children, will be healthier.  You will have extra money to spend on things other than cigarettes. QUESTIONS TO THINK ABOUT BEFORE ATTEMPTING TO QUIT You may want to talk about your answers with your  health care provider.  Why do you want to quit?  If you tried to quit in the past, what helped and what did not?  What will be the most difficult situations for you after you quit? How will you plan to handle them?  Who can help you through the tough times? Your family? Friends? A health care provider?  What pleasures do you get from smoking? What ways can you still get pleasure if you quit? Here are some questions to ask your health care provider:  How can you help me to be successful at quitting?  What medicine do you think would be best for me and how should I take it?  What should I do if I need more help?  What is smoking withdrawal like? How can I get information on withdrawal? GET READY  Set a quit date.  Change your environment by getting rid of all cigarettes, ashtrays, matches, and lighters in your home, car, or work. Do not let people smoke in your home.  Review your past attempts to quit. Think about what worked and what did not. GET SUPPORT AND ENCOURAGEMENT You have a better chance of being successful if you have help. You can get support in many ways.  Tell your family, friends, and coworkers that you are going to quit and need their support. Ask them not to smoke around you.  Get individual, group, or telephone counseling and support. Programs are available at local hospitals and health centers. Call   your local health department for information about programs in your area.  Spiritual beliefs and practices may help some smokers quit.  Download a "quit meter" on your computer to keep track of quit statistics, such as how long you have gone without smoking, cigarettes not smoked, and money saved.  Get a self-help book about quitting smoking and staying off tobacco. LEARN NEW SKILLS AND BEHAVIORS  Distract yourself from urges to smoke. Talk to someone, go for a walk, or occupy your time with a task.  Change your normal routine. Take a different route to work.  Drink tea instead of coffee. Eat breakfast in a different place.  Reduce your stress. Take a hot bath, exercise, or read a book.  Plan something enjoyable to do every day. Reward yourself for not smoking.  Explore interactive web-based programs that specialize in helping you quit. GET MEDICINE AND USE IT CORRECTLY Medicines can help you stop smoking and decrease the urge to smoke. Combining medicine with the above behavioral methods and support can greatly increase your chances of successfully quitting smoking.  Nicotine replacement therapy helps deliver nicotine to your body without the negative effects and risks of smoking. Nicotine replacement therapy includes nicotine gum, lozenges, inhalers, nasal sprays, and skin patches. Some may be available over-the-counter and others require a prescription.  Antidepressant medicine helps people abstain from smoking, but how this works is unknown. This medicine is available by prescription.  Nicotinic receptor partial agonist medicine simulates the effect of nicotine in your brain. This medicine is available by prescription. Ask your health care provider for advice about which medicines to use and how to use them based on your health history. Your health care provider will tell you what side effects to look out for if you choose to be on a medicine or therapy. Carefully read the information on the package. Do not use any other product containing nicotine while using a nicotine replacement product.  RELAPSE OR DIFFICULT SITUATIONS Most relapses occur within the first 3 months after quitting. Do not be discouraged if you start smoking again. Remember, most people try several times before finally quitting. You may have symptoms of withdrawal because your body is used to nicotine. You may crave cigarettes, be irritable, feel very hungry, cough often, get headaches, or have difficulty concentrating. The withdrawal symptoms are only temporary. They are strongest  when you first quit, but they will go away within 10-14 days. To reduce the chances of relapse, try to:  Avoid drinking alcohol. Drinking lowers your chances of successfully quitting.  Reduce the amount of caffeine you consume. Once you quit smoking, the amount of caffeine in your body increases and can give you symptoms, such as a rapid heartbeat, sweating, and anxiety.  Avoid smokers because they can make you want to smoke.  Do not let weight gain distract you. Many smokers will gain weight when they quit, usually less than 10 pounds. Eat a healthy diet and stay active. You can always lose the weight gained after you quit.  Find ways to improve your mood other than smoking. FOR MORE INFORMATION  www.smokefree.gov  Document Released: 09/20/2001 Document Revised: 02/10/2014 Document Reviewed: 01/05/2012 [employer] Patient Information 2015 [employer]. This information is not intended to replace advice given to you by your health care provider. Make sure you discuss any questions you have with your health care provider.  

## 2014-12-23 NOTE — Progress Notes (Signed)
[facility] [phone]   [phone]  OFFICE PROGRESS NOTE  VYAS,DHRUV B., MD [place] [address]  DIAGNOSIS: Stage 0 chronic lymphocytic leukemia  PRIOR THERAPY: None  CURRENT THERAPY: Observation  INTERVAL HISTORY: Kimberly King 64 y.o. female returns to the clinic today for six-month followup visit. No significant change since her last visit. The patient has been observation and doing very well. She denied having any significant weight loss or night sweats. She has no chest pain, shortness of breath, cough or hemoptysis. She denied having any significant nausea or vomiting, no fever or chills. She has no palpable lymphadenopathy. She had repeat CBC performed earlier today and she is here for evaluation and discussion of her lab results.  MEDICAL HISTORY: Past Medical History  Diagnosis Date  . Hypertension   . Leukocytosis     and low blood sodium   . Smoker     ALLERGIES:  has No Known Allergies.  MEDICATIONS:  Current Outpatient Prescriptions  Medication Sig Dispense Refill  . Calcium Carbonate-Vitamin D (CALCIUM + D PO) Take 2 tablets by mouth daily.    . Chlorphen-Pseudoephed-APAP (TYLENOL ALLERGY SINUS PO) Take 1 tablet by mouth as needed.    Marland Kitchen FLUTICASONE PROPIONATE, NASAL, NA Place 2 sprays into the nose daily.    Marland Kitchen lisinopril-hydrochlorothiazide (PRINZIDE,ZESTORETIC) 10-12.5 MG per tablet Take 1 tablet by mouth daily.    . pantoprazole (PROTONIX) 40 MG tablet Take 40 mg by mouth daily.    . RESTASIS 0.05 % ophthalmic emulsion   4  . traMADol (ULTRAM) 50 MG tablet Take by mouth every 6 (six) hours as needed.    Marland Kitchen ibuprofen (ADVIL,MOTRIN) 200 MG tablet Take 400 mg by mouth every 6 (six) hours as needed for pain.    . Naproxen Sodium (ALEVE PO) Take 2 tablets by mouth as needed.    . SUMAtriptan (IMITREX) 50 MG tablet Take 50 mg by mouth every 2 (two) hours as needed for migraine.     No current  facility-administered medications for this visit.    SURGICAL HISTORY:  Past Surgical History  Procedure Laterality Date  . Tonsillectomy    . Appendectomy    . Dilation and curettage of uterus    . Bladder surgery      tack  . Cervical fusion      x2    REVIEW OF SYSTEMS:  A comprehensive review of systems was negative.   PHYSICAL EXAMINATION: General appearance: alert, cooperative and no distress Head: Normocephalic, without obvious abnormality, atraumatic Neck: no adenopathy, no JVD, supple, symmetrical, trachea midline and thyroid not enlarged, symmetric, no tenderness/mass/nodules Lymph nodes: Cervical, supraclavicular, and axillary nodes normal. Resp: clear to auscultation bilaterally Back: symmetric, no curvature. ROM normal. No CVA tenderness. Cardio: regular rate and rhythm, S1, S2 normal, no murmur, click, rub or gallop GI: soft, non-tender; bowel sounds normal; no masses,  no organomegaly Extremities: extremities normal, atraumatic, no cyanosis or edema  ECOG PERFORMANCE STATUS: 0 - Asymptomatic  There were no vitals taken for this visit.  LABORATORY DATA: Lab Results  Component Value Date   WBC 17.3* 12/23/2014   HGB 11.5* 12/23/2014   HCT 35.3 12/23/2014   MCV 90.7 12/23/2014   PLT 344 12/23/2014      Chemistry      Component Value Date/Time   NA 142 12/23/2014 1451   K 3.7 12/23/2014 1451   CO2 24 12/23/2014 1451   BUN 15.7 12/23/2014  1451   CREATININE 0.7 12/23/2014 1451      Component Value Date/Time   CALCIUM 9.1 12/23/2014 1451   ALKPHOS 79 12/23/2014 1451   AST 15 12/23/2014 1451   ALT 17 12/23/2014 1451   BILITOT <0.20 12/23/2014 1451       RADIOGRAPHIC STUDIES: No results found.  ASSESSMENT AND PLAN: This is a very pleasant 64 years old white female with stage 0 chronic lymphocytic leukemia currently on observation. Her CBC today showed no evidence for disease progression.  Her CBC showed slightly increased total white blood  count. I discussed the lab result with the patient and recommended for her to continue on observation for now with repeat CBC, comprehensive metabolic panel and LDH in 6 months. She was advised to call immediately if she has any concerning symptoms in the interval.  The patient voices understanding of current disease status and treatment options and is in agreement with the current care plan.  All questions were answered. The patient knows to call the clinic with any problems, questions or concerns. We can certainly see the patient much sooner if necessary.  Disclaimer: This note was dictated with voice recognition software. Similar sounding words can inadvertently be transcribed and may not be corrected upon review.

## 2014-12-23 NOTE — Telephone Encounter (Signed)
Gave avs & calendar for September °

## 2015-01-16 ENCOUNTER — Telehealth: Payer: Self-pay | Admitting: Internal Medicine

## 2015-01-16 NOTE — Telephone Encounter (Signed)
s.w. pt and advised on 9.13 appt earlier time due to MD on call....pt ok and aware

## 2015-06-23 ENCOUNTER — Ambulatory Visit: Payer: BLUE CROSS/BLUE SHIELD | Admitting: Internal Medicine

## 2015-06-23 ENCOUNTER — Telehealth: Payer: Self-pay | Admitting: Internal Medicine

## 2015-06-23 ENCOUNTER — Other Ambulatory Visit: Payer: BLUE CROSS/BLUE SHIELD

## 2015-06-23 ENCOUNTER — Other Ambulatory Visit (HOSPITAL_BASED_OUTPATIENT_CLINIC_OR_DEPARTMENT_OTHER): Payer: BLUE CROSS/BLUE SHIELD

## 2015-06-23 ENCOUNTER — Ambulatory Visit (HOSPITAL_BASED_OUTPATIENT_CLINIC_OR_DEPARTMENT_OTHER): Payer: BLUE CROSS/BLUE SHIELD | Admitting: Internal Medicine

## 2015-06-23 ENCOUNTER — Encounter: Payer: Self-pay | Admitting: Internal Medicine

## 2015-06-23 VITALS — BP 122/77 | HR 76 | Temp 98.4°F | Resp 18 | Ht 64.0 in | Wt 175.8 lb

## 2015-06-23 DIAGNOSIS — C9110 Chronic lymphocytic leukemia of B-cell type not having achieved remission: Secondary | ICD-10-CM

## 2015-06-23 LAB — CBC WITH DIFFERENTIAL/PLATELET
BASO%: 0.7 % (ref 0.0–2.0)
Basophils Absolute: 0.1 10*3/uL (ref 0.0–0.1)
EOS%: 1.8 % (ref 0.0–7.0)
Eosinophils Absolute: 0.3 10*3/uL (ref 0.0–0.5)
HCT: 38.8 % (ref 34.8–46.6)
HGB: 12.9 g/dL (ref 11.6–15.9)
LYMPH%: 57.8 % — AB (ref 14.0–49.7)
MCH: 29.5 pg (ref 25.1–34.0)
MCHC: 33.2 g/dL (ref 31.5–36.0)
MCV: 88.7 fL (ref 79.5–101.0)
MONO#: 0.9 10*3/uL (ref 0.1–0.9)
MONO%: 5 % (ref 0.0–14.0)
NEUT#: 6 10*3/uL (ref 1.5–6.5)
NEUT%: 34.7 % — AB (ref 38.4–76.8)
Platelets: 342 10*3/uL (ref 145–400)
RBC: 4.38 10*6/uL (ref 3.70–5.45)
RDW: 14.5 % (ref 11.2–14.5)
WBC: 17.2 10*3/uL — ABNORMAL HIGH (ref 3.9–10.3)
lymph#: 9.9 10*3/uL — ABNORMAL HIGH (ref 0.9–3.3)

## 2015-06-23 LAB — COMPREHENSIVE METABOLIC PANEL (CC13)
ALT: 23 U/L (ref 0–55)
ANION GAP: 8 meq/L (ref 3–11)
AST: 19 U/L (ref 5–34)
Albumin: 4.3 g/dL (ref 3.5–5.0)
Alkaline Phosphatase: 93 U/L (ref 40–150)
BUN: 23 mg/dL (ref 7.0–26.0)
CHLORIDE: 106 meq/L (ref 98–109)
CO2: 26 meq/L (ref 22–29)
CREATININE: 0.9 mg/dL (ref 0.6–1.1)
Calcium: 9.9 mg/dL (ref 8.4–10.4)
EGFR: 73 mL/min/{1.73_m2} — ABNORMAL LOW (ref >90)
Glucose: 100 mg/dl (ref 70–140)
Potassium: 4 mEq/L (ref 3.5–5.1)
Sodium: 140 mEq/L (ref 136–145)
Total Bilirubin: 0.28 mg/dL (ref 0.20–1.20)
Total Protein: 7.1 g/dL (ref 6.4–8.3)

## 2015-06-23 LAB — TECHNOLOGIST REVIEW

## 2015-06-23 LAB — LACTATE DEHYDROGENASE (CC13): LDH: 162 U/L (ref 125–245)

## 2015-06-23 NOTE — Telephone Encounter (Signed)
Gave adn printed appt sched and avs for pt for March 2017 °

## 2015-06-23 NOTE — Progress Notes (Signed)
[facility] [phone]   Fax:(336) [phone]  OFFICE PROGRESS NOTE  VYAS,DHRUV B., MD [place] [address]  DIAGNOSIS: Stage 0 chronic lymphocytic leukemia  PRIOR THERAPY: None  CURRENT THERAPY: Observation.  INTERVAL HISTORY: Kimberly King 64 y.o. female returns to the clinic today for six-month followup visit. Ms. Grall is doing well today with no specific complaints. She denied having any significant weight loss or night sweats. She has no chest pain, shortness of breath, cough or hemoptysis. She denied having any significant nausea or vomiting, no fever or chills. She has no palpable lymphadenopathy. She had repeat CBC performed earlier today and she is here for evaluation and discussion of her lab results.  MEDICAL HISTORY: Past Medical History  Diagnosis Date  . Hypertension   . Leukocytosis     and low blood sodium   . Smoker     ALLERGIES:  has No Known Allergies.  MEDICATIONS:  Current Outpatient Prescriptions  Medication Sig Dispense Refill  . Calcium Carbonate-Vitamin D (CALCIUM + D PO) Take 2 tablets by mouth daily.    . Chlorphen-Pseudoephed-APAP (TYLENOL ALLERGY SINUS PO) Take 1 tablet by mouth as needed.    . diphenhydrAMINE (BENADRYL) 25 MG tablet Take 25 mg by mouth at bedtime as needed (for insomnia).    . FLUTICASONE PROPIONATE, NASAL, NA Place 2 sprays into the nose daily.    Marland Kitchen ibuprofen (ADVIL,MOTRIN) 800 MG tablet Take 800 mg by mouth daily as needed.     Marland Kitchen lisinopril-hydrochlorothiazide (PRINZIDE,ZESTORETIC) 10-12.5 MG per tablet Take 1 tablet by mouth daily.    . pantoprazole (PROTONIX) 40 MG tablet Take 40 mg by mouth daily.    . RESTASIS 0.05 % ophthalmic emulsion   4  . traMADol (ULTRAM) 50 MG tablet Take by mouth every 6 (six) hours as needed.    . Naproxen Sodium (ALEVE PO) Take 2 tablets by mouth as needed.    . SUMAtriptan (IMITREX) 50 MG tablet Take 50 mg by mouth every 2 (two) hours as needed for  migraine.     No current facility-administered medications for this visit.    SURGICAL HISTORY:  Past Surgical History  Procedure Laterality Date  . Tonsillectomy    . Appendectomy    . Dilation and curettage of uterus    . Bladder surgery      tack  . Cervical fusion      x2    REVIEW OF SYSTEMS:  A comprehensive review of systems was negative.   PHYSICAL EXAMINATION: General appearance: alert, cooperative and no distress Head: Normocephalic, without obvious abnormality, atraumatic Neck: no adenopathy, no JVD, supple, symmetrical, trachea midline and thyroid not enlarged, symmetric, no tenderness/mass/nodules Lymph nodes: Cervical, supraclavicular, and axillary nodes normal. Resp: clear to auscultation bilaterally Back: symmetric, no curvature. ROM normal. No CVA tenderness. Cardio: regular rate and rhythm, S1, S2 normal, no murmur, click, rub or gallop GI: soft, non-tender; bowel sounds normal; no masses,  no organomegaly Extremities: extremities normal, atraumatic, no cyanosis or edema  ECOG PERFORMANCE STATUS: 0 - Asymptomatic  Blood pressure 122/77, pulse 76, temperature 98.4 F (36.9 C), temperature source Oral, resp. rate 18, height 5\' 4"  (1.626 m), weight 175 lb 12.8 oz (79.742 kg), SpO2 97 %.  LABORATORY DATA: Lab Results  Component Value Date   WBC 17.2* 06/23/2015   HGB 12.9 06/23/2015   HCT 38.8 06/23/2015   MCV 88.7 06/23/2015   PLT 342 06/23/2015  Chemistry      Component Value Date/Time   NA 140 06/23/2015 1033   K 4.0 06/23/2015 1033   CO2 26 06/23/2015 1033   BUN 23.0 06/23/2015 1033   CREATININE 0.9 06/23/2015 1033      Component Value Date/Time   CALCIUM 9.9 06/23/2015 1033   ALKPHOS 93 06/23/2015 1033   AST 19 06/23/2015 1033   ALT 23 06/23/2015 1033   BILITOT 0.28 06/23/2015 1033       RADIOGRAPHIC STUDIES: No results found.  ASSESSMENT AND PLAN: This is a very pleasant 64 years old white female with stage 0 chronic  lymphocytic leukemia currently on observation.  Her CBC today showed no evidence for disease progression and almost exact number of the total white blood count. I discussed the lab result with the patient and recommended for her to continue on observation for now with repeat CBC, comprehensive metabolic panel and LDH in 6 months. She was advised to call immediately if she has any concerning symptoms in the interval.  The patient voices understanding of current disease status and treatment options and is in agreement with the current care plan.  All questions were answered. The patient knows to call the clinic with any problems, questions or concerns. We can certainly see the patient much sooner if necessary.  Disclaimer: This note was dictated with voice recognition software. Similar sounding words can inadvertently be transcribed and may not be corrected upon review.

## 2015-06-24 ENCOUNTER — Other Ambulatory Visit: Payer: Self-pay | Admitting: Neurosurgery

## 2015-06-24 DIAGNOSIS — M47812 Spondylosis without myelopathy or radiculopathy, cervical region: Secondary | ICD-10-CM

## 2015-07-09 ENCOUNTER — Ambulatory Visit
Admission: RE | Admit: 2015-07-09 | Discharge: 2015-07-09 | Disposition: A | Discharge status: Home / Self Care | Payer: BLUE CROSS/BLUE SHIELD | Source: Ambulatory Visit | Attending: Neurosurgery | Admitting: Neurosurgery

## 2015-07-09 DIAGNOSIS — M47812 Spondylosis without myelopathy or radiculopathy, cervical region: Secondary | ICD-10-CM

## 2015-07-09 MED ORDER — IOHEXOL 300 MG/ML  SOLN
1.0000 mL | Freq: Once | INTRAMUSCULAR | Status: DC | PRN
  Administered 2015-07-09: 1 mL via INTRA_ARTICULAR

## 2015-07-09 MED ORDER — DEXAMETHASONE SODIUM PHOSPHATE 4 MG/ML IJ SOLN
4.0000 mg | Freq: Once | INTRAMUSCULAR | Status: AC
  Administered 2015-07-09: 4 mg via INTRA_ARTICULAR

## 2015-07-09 MED ORDER — TRIAMCINOLONE ACETONIDE 40 MG/ML IJ SUSP (RADIOLOGY): 60.0000 mg | Freq: Once | INTRAMUSCULAR | Status: DC

## 2015-07-09 NOTE — Discharge Instructions (Signed)

## 2015-07-24 ENCOUNTER — Other Ambulatory Visit: Payer: Self-pay | Admitting: Neurosurgery

## 2015-07-24 DIAGNOSIS — M47812 Spondylosis without myelopathy or radiculopathy, cervical region: Secondary | ICD-10-CM

## 2015-07-30 ENCOUNTER — Other Ambulatory Visit (HOSPITAL_COMMUNITY): Payer: Self-pay | Admitting: Obstetrics

## 2015-07-30 DIAGNOSIS — Z1231 Encounter for screening mammogram for malignant neoplasm of breast: Secondary | ICD-10-CM

## 2015-08-09 ENCOUNTER — Ambulatory Visit (INDEPENDENT_AMBULATORY_CARE_PROVIDER_SITE_OTHER): Payer: BLUE CROSS/BLUE SHIELD | Admitting: Family Medicine

## 2015-08-09 DIAGNOSIS — Z23 Encounter for immunization: Secondary | ICD-10-CM | POA: Diagnosis not present

## 2015-08-24 ENCOUNTER — Ambulatory Visit (HOSPITAL_COMMUNITY)
Admission: RE | Admit: 2015-08-24 | Discharge: 2015-08-24 | Disposition: A | Discharge status: Home / Self Care | Payer: BLUE CROSS/BLUE SHIELD | Source: Ambulatory Visit | Attending: Obstetrics | Admitting: Obstetrics

## 2015-08-24 DIAGNOSIS — Z1231 Encounter for screening mammogram for malignant neoplasm of breast: Secondary | ICD-10-CM | POA: Diagnosis not present

## 2015-09-07 ENCOUNTER — Telehealth: Payer: Self-pay | Admitting: *Deleted

## 2015-09-07 NOTE — Telephone Encounter (Signed)
Per MD notified pt of MD response. Pt verbalized understanding advised she will call if has any concerns going forward.

## 2015-09-07 NOTE — Telephone Encounter (Signed)
The mild increase in total white blood count is not so concerning. I will see her back for follow-up visit as previously scheduled in March 2017. I will be happy to see her sooner if she continues to have any concerning.

## 2015-09-07 NOTE — Telephone Encounter (Signed)
Voicemail: "My wbc level jumped from 17.2 to 23.2.  My next F/U isn't until March.  Insurance will no longer pay 100 % of labs next year so I want to know what's going on.  Are the results skewed by the CLL?  Do I need a bone density?  Dr. Julien Nordmann was to get results from last week's physical.  Please call [phone]."

## 2015-10-20 ENCOUNTER — Other Ambulatory Visit: Payer: Self-pay | Admitting: Neurosurgery

## 2015-10-20 DIAGNOSIS — M5030 Other cervical disc degeneration, unspecified cervical region: Secondary | ICD-10-CM

## 2015-10-27 ENCOUNTER — Ambulatory Visit
Admission: RE | Admit: 2015-10-27 | Discharge: 2015-10-27 | Disposition: A | Discharge status: Home / Self Care | Payer: BLUE CROSS/BLUE SHIELD | Source: Ambulatory Visit | Attending: Neurosurgery | Admitting: Neurosurgery

## 2015-10-27 DIAGNOSIS — D72829 Elevated white blood cell count, unspecified: Secondary | ICD-10-CM | POA: Insufficient documentation

## 2015-10-27 DIAGNOSIS — M5030 Other cervical disc degeneration, unspecified cervical region: Secondary | ICD-10-CM

## 2015-10-27 MED ORDER — IOHEXOL 300 MG/ML  SOLN
1.0000 mL | Freq: Once | INTRAMUSCULAR | Status: AC | PRN
Start: 2015-10-27 — End: 2015-10-27
  Administered 2015-10-27: 1 mL via INTRA_ARTERIAL

## 2015-10-27 MED ORDER — DEXAMETHASONE SODIUM PHOSPHATE 4 MG/ML IJ SOLN
4.0000 mg | Freq: Once | INTRAMUSCULAR | Status: AC
  Administered 2015-10-27: 4 mg via INTRA_ARTICULAR

## 2015-10-27 NOTE — Discharge Instructions (Signed)

## 2015-12-24 ENCOUNTER — Encounter: Payer: Self-pay | Admitting: Internal Medicine

## 2015-12-24 ENCOUNTER — Other Ambulatory Visit (HOSPITAL_BASED_OUTPATIENT_CLINIC_OR_DEPARTMENT_OTHER): Payer: BLUE CROSS/BLUE SHIELD

## 2015-12-24 ENCOUNTER — Ambulatory Visit (HOSPITAL_BASED_OUTPATIENT_CLINIC_OR_DEPARTMENT_OTHER): Payer: BLUE CROSS/BLUE SHIELD | Admitting: Internal Medicine

## 2015-12-24 ENCOUNTER — Telehealth: Payer: Self-pay | Admitting: Internal Medicine

## 2015-12-24 VITALS — BP 139/86 | HR 76 | Temp 98.2°F | Resp 17 | Ht 64.0 in | Wt 179.2 lb

## 2015-12-24 DIAGNOSIS — C9110 Chronic lymphocytic leukemia of B-cell type not having achieved remission: Secondary | ICD-10-CM

## 2015-12-24 LAB — COMPREHENSIVE METABOLIC PANEL
ALT: 29 U/L (ref 0–55)
AST: 23 U/L (ref 5–34)
Albumin: 4.1 g/dL (ref 3.5–5.0)
Alkaline Phosphatase: 87 U/L (ref 40–150)
Anion Gap: 9 mEq/L (ref 3–11)
BUN: 21.4 mg/dL (ref 7.0–26.0)
CALCIUM: 9.8 mg/dL (ref 8.4–10.4)
CHLORIDE: 102 meq/L (ref 98–109)
CO2: 27 mEq/L (ref 22–29)
Creatinine: 1 mg/dL (ref 0.6–1.1)
EGFR: 63 mL/min/{1.73_m2} — ABNORMAL LOW (ref >90)
GLUCOSE: 103 mg/dL (ref 70–140)
POTASSIUM: 3.9 meq/L (ref 3.5–5.1)
SODIUM: 138 meq/L (ref 136–145)
Total Bilirubin: <0.3 mg/dL (ref 0.20–1.20)
Total Protein: 7.8 g/dL (ref 6.4–8.3)

## 2015-12-24 LAB — CBC WITH DIFFERENTIAL/PLATELET
BASO%: 0.3 % (ref 0.0–2.0)
Basophils Absolute: 0.1 10*3/uL (ref 0.0–0.1)
EOS%: 0.1 % (ref 0.0–7.0)
Eosinophils Absolute: 0 10*3/uL (ref 0.0–0.5)
HCT: 36.3 % (ref 34.8–46.6)
HGB: 11.6 g/dL (ref 11.6–15.9)
LYMPH%: 41.1 % (ref 14.0–49.7)
MCH: 28.4 pg (ref 25.1–34.0)
MCHC: 31.9 g/dL (ref 31.5–36.0)
MCV: 89 fL (ref 79.5–101.0)
MONO#: 1.7 10*3/uL — ABNORMAL HIGH (ref 0.1–0.9)
MONO%: 6.9 % (ref 0.0–14.0)
NEUT#: 13 10*3/uL — ABNORMAL HIGH (ref 1.5–6.5)
NEUT%: 51.6 % (ref 38.4–76.8)
Platelets: 519 10*3/uL — ABNORMAL HIGH (ref 145–400)
RBC: 4.07 10*6/uL (ref 3.70–5.45)
RDW: 14.8 % — ABNORMAL HIGH (ref 11.2–14.5)
WBC: 25.2 10*3/uL — ABNORMAL HIGH (ref 3.9–10.3)
lymph#: 10.4 10*3/uL — ABNORMAL HIGH (ref 0.9–3.3)

## 2015-12-24 LAB — LACTATE DEHYDROGENASE: LDH: 201 U/L (ref 125–245)

## 2015-12-24 NOTE — Progress Notes (Signed)
[name]   [phone]  OFFICE PROGRESS NOTE  VYAS,DHRUV B., MD [place] [id number]  DIAGNOSIS: Stage 0 chronic lymphocytic leukemia  PRIOR THERAPY: None  CURRENT THERAPY: Observation.  INTERVAL HISTORY: Kimberly King 65 y.o. female returns to the clinic today for six-month followup visit. The patient is doing well today with no specific complaints. She was recently treated for upper respiratory infection with Cipro and Medrol Dosepak. She is feeling much better. She denied having any significant weight loss or night sweats. She has no chest pain, shortness of breath, cough or hemoptysis. She denied having any significant nausea or vomiting, no fever or chills. She has no palpable lymphadenopathy. She had repeat CBC performed earlier today and she is here for evaluation and discussion of her lab results.  MEDICAL HISTORY: Past Medical History  Diagnosis Date  . Hypertension   . Leukocytosis     and low blood sodium   . Smoker     ALLERGIES:  has No Known Allergies.  MEDICATIONS:  Current Outpatient Prescriptions  Medication Sig Dispense Refill  . Calcium Carbonate-Vitamin D (CALCIUM + D PO) Take 2 tablets by mouth daily.    . ciprofloxacin (CIPRO) 500 MG tablet Take 500 mg by mouth 2 (two) times daily. for 10 days  0  . diclofenac (VOLTAREN) 75 MG EC tablet Take 75 mg by mouth 2 (two) times daily.  3  . FLUTICASONE PROPIONATE, NASAL, NA Place 2 sprays into the nose daily.    . pantoprazole (PROTONIX) 40 MG tablet Take 40 mg by mouth daily.    . predniSONE (STERAPRED UNI-PAK 21 TAB) 5 MG (21) TBPK tablet Take as directed  0  . RESTASIS 0.05 % ophthalmic emulsion   4  . diphenhydrAMINE (BENADRYL) 25 MG tablet Take 25 mg by mouth at bedtime as needed (for insomnia). Reported on 12/24/2015    . ibuprofen (ADVIL,MOTRIN) 800 MG tablet Take 800 mg by mouth daily as needed. Reported on 12/24/2015    .  lisinopril-hydrochlorothiazide (PRINZIDE,ZESTORETIC) 10-12.5 MG per tablet Take 1 tablet by mouth daily. Reported on 12/24/2015    . Naproxen Sodium (ALEVE PO) Take 2 tablets by mouth as needed. Reported on 12/24/2015    . SUMAtriptan (IMITREX) 50 MG tablet Take 50 mg by mouth every 2 (two) hours as needed for migraine. Reported on 12/24/2015    . traMADol (ULTRAM) 50 MG tablet Take by mouth every 6 (six) hours as needed. Reported on 12/24/2015     No current facility-administered medications for this visit.    SURGICAL HISTORY:  Past Surgical History  Procedure Laterality Date  . Tonsillectomy    . Appendectomy    . Dilation and curettage of uterus    . Bladder surgery      tack  . Cervical fusion      x2    REVIEW OF SYSTEMS:  A comprehensive review of systems was negative.   PHYSICAL EXAMINATION: General appearance: alert, cooperative and no distress Head: Normocephalic, without obvious abnormality, atraumatic Neck: no adenopathy, no JVD, supple, symmetrical, trachea midline and thyroid not enlarged, symmetric, no tenderness/mass/nodules Lymph nodes: Cervical, supraclavicular, and axillary nodes normal. Resp: clear to auscultation bilaterally Back: symmetric, no curvature. ROM normal. No CVA tenderness. Cardio: regular rate and rhythm, S1, S2 normal, no murmur, click, rub or gallop GI: soft, non-tender; bowel sounds normal; no masses,  no organomegaly Extremities: extremities normal, atraumatic, no cyanosis or edema  ECOG PERFORMANCE STATUS: 0 - Asymptomatic  Blood pressure 139/86, pulse 76, temperature 98.2 F (36.8 C), temperature source Oral, resp. rate 17, height 5\' 4"  (1.626 m), weight 179 lb 3.2 oz (81.285 kg), SpO2 97 %.  LABORATORY DATA: Lab Results  Component Value Date   WBC 25.2* 12/24/2015   HGB 11.6 12/24/2015   HCT 36.3 12/24/2015   MCV 89.0 12/24/2015   PLT 519* 12/24/2015      Chemistry      Component Value Date/Time   NA 138 12/24/2015 1040   K 3.9  12/24/2015 1040   CO2 27 12/24/2015 1040   BUN 21.4 12/24/2015 1040   CREATININE 1.0 12/24/2015 1040      Component Value Date/Time   CALCIUM 9.8 12/24/2015 1040   ALKPHOS 87 12/24/2015 1040   AST 23 12/24/2015 1040   ALT 29 12/24/2015 1040   BILITOT <0.30 12/24/2015 1040       RADIOGRAPHIC STUDIES: No results found.  ASSESSMENT AND PLAN: This is a very pleasant 65 years old white female with stage 0 chronic lymphocytic leukemia currently on observation.  Her CBC today showed mild increase in the total white blood count likely secondary to her recent infection and steroid treatment.  I discussed the lab result with the patient and recommended for her to continue on observation for now with repeat CBC, comprehensive metabolic panel and LDH in 6 months. She was advised to call immediately if she has any concerning symptoms in the interval.  The patient voices understanding of current disease status and treatment options and is in agreement with the current care plan.  All questions were answered. The patient knows to call the clinic with any problems, questions or concerns. We can certainly see the patient much sooner if necessary.  Disclaimer: This note was dictated with voice recognition software. Similar sounding words can inadvertently be transcribed and may not be corrected upon review.

## 2015-12-24 NOTE — Telephone Encounter (Signed)
Gave and printed appt sched and avs for pt for Sept °

## 2016-03-10 ENCOUNTER — Other Ambulatory Visit: Payer: Self-pay | Admitting: Neurosurgery

## 2016-03-10 DIAGNOSIS — M542 Cervicalgia: Secondary | ICD-10-CM

## 2016-03-16 ENCOUNTER — Encounter: Payer: Self-pay | Admitting: Internal Medicine

## 2016-03-29 ENCOUNTER — Ambulatory Visit
Admission: RE | Admit: 2016-03-29 | Discharge: 2016-03-29 | Disposition: A | Discharge status: Home / Self Care | Payer: BLUE CROSS/BLUE SHIELD | Source: Ambulatory Visit | Attending: Neurosurgery | Admitting: Neurosurgery

## 2016-03-29 DIAGNOSIS — M542 Cervicalgia: Secondary | ICD-10-CM

## 2016-03-29 MED ORDER — DEXAMETHASONE SODIUM PHOSPHATE 4 MG/ML IJ SOLN
5.0000 mg | Freq: Once | INTRAMUSCULAR | Status: AC
  Administered 2016-03-29: 5.2 mg via INTRA_ARTICULAR

## 2016-04-29 ENCOUNTER — Ambulatory Visit (AMBULATORY_SURGERY_CENTER): Payer: Self-pay | Admitting: *Deleted

## 2016-04-29 VITALS — Ht 63.0 in | Wt 184.8 lb

## 2016-04-29 DIAGNOSIS — Z86010 Personal history of colonic polyps: Secondary | ICD-10-CM

## 2016-04-29 MED ORDER — SUPREP BOWEL PREP KIT 17.5-3.13-1.6 GM/177ML PO SOLN: 1.0000 | Freq: Once | ORAL | Status: DC

## 2016-04-29 NOTE — Progress Notes (Signed)
Patient denies any allergies to egg or soy products. Patient denies complications with anesthesia/sedation.  Patient denies oxygen use at home and denies diet medications. Emmi instructions for colonoscopy explained but patient denied.     

## 2016-04-30 ENCOUNTER — Ambulatory Visit (INDEPENDENT_AMBULATORY_CARE_PROVIDER_SITE_OTHER): Payer: BLUE CROSS/BLUE SHIELD

## 2016-04-30 ENCOUNTER — Ambulatory Visit (INDEPENDENT_AMBULATORY_CARE_PROVIDER_SITE_OTHER): Payer: BLUE CROSS/BLUE SHIELD | Admitting: Family Medicine

## 2016-04-30 VITALS — BP 138/84 | HR 90 | Temp 98.0°F | Resp 18 | Ht 64.0 in | Wt 183.0 lb

## 2016-04-30 DIAGNOSIS — M25562 Pain in left knee: Secondary | ICD-10-CM

## 2016-04-30 NOTE — Progress Notes (Signed)
By signing my name below, I, [name], attest that this documentation has been prepared under the direction and in the presence of Merri Ray, MD.  Electronically Signed: Verlee Monte, Medical Scribe. 04/30/2016. 9:24 AM.  Subjective:    Patient ID: Kimberly King, female    DOB: 1951/09/14, 65 y.o.   MRN: [id number]  HPI Chief Complaint  Patient presents with  . Knee Pain    x 1 week - NKI-    HPI Comments: Kimberly King is a 65 y.o. female with a PMHx of CLL who presents to the [facility] complaining of left knee pain onset last Friday. Pt states she has arthritis in her knee. Pt was walking in the sand and frequently walked up the stairs last week when she went to the beach. Pt reports some weakness in her knee. Pt used ice for relief to her symptoms. Pt is wearing OTC brace with some semi ridged support that helps make her knee feel more secure, but does not relieve the pain. Pt states this is the worse it's ever flared up. Pt went to [facility] for relief to her arthritic symptoms in the past- doesn't think her left knee had been x-rayed before. Pt had an injection in her right knee, and her hip in the past. Pt states she's never had a problem with her left knee before. Pt denies having a recent injury, knee surgery, and knee locking. Pt is on an anti-inflammatory prescription- Diclofenac BID, Tramadol regularly.  Rash: Pt reports patches on her left lower leg.  Patient Active Problem List   Diagnosis Date Noted  . Elevated WBC count 10/27/2015  . CLL (chronic lymphocytic leukemia) ([place]) 12/17/2013  . Chronic lymphocytic leukemia ([place]) 05/16/2013  . DERMATOPHYTOSIS OF NAIL 12/31/2007  . INSOMNIA UNSPECIFIED 12/31/2007  . COLONIC POLYPS, ADENOMATOUS 10/26/2007  . HEMORRHOIDS, INTERNAL 10/26/2007  . NECK PAIN 03/07/2007  . BACK PAIN, LUMBAR 03/07/2007   Past Medical History  Diagnosis Date  . Hypertension   . Leukocytosis     and low blood  sodium   . Smoker   . Anxiety   . Allergy   . GERD (gastroesophageal reflux disease)   . Migraines   . Depression   . Arthritis     knees hips neck  . Cancer [facility])     leukemia dx 06/2013 - no current treatment - being monitored  . Cataract     bilateral - being monitored, no treatment currently   Past Surgical History  Procedure Laterality Date  . Tonsillectomy    . Appendectomy    . Dilation and curettage of uterus      x 2  . Bladder surgery      tack  . Cervical fusion      x2  -  C5-C6  . Colonoscopy  2014    py[facility] - hx polyps   No Known Allergies Prior to Admission medications   Medication Sig Start Date End Date Taking? Authorizing Provider  ALPRAZolam Duanne Moron) 0.25 MG tablet Take 0.25 mg by mouth as needed for anxiety.   Yes Historical Provider, MD  Calcium Carbonate-Vitamin D (CALCIUM + D PO) Take 2 tablets by mouth daily.   Yes Historical Provider, MD  cetirizine (ZYRTEC) 10 MG tablet Take 10 mg by mouth daily as needed for allergies.   Yes Historical Provider, MD  diclofenac (VOLTAREN) 75 MG EC tablet Take 75 mg by mouth 2 (two) times daily. 11/22/15  Yes Historical  Provider, MD  diphenhydrAMINE (BENADRYL) 25 MG tablet Take 25 mg by mouth at bedtime as needed (for insomnia). Reported on 04/29/2016   Yes Historical Provider, MD  FLUTICASONE PROPIONATE, NASAL, NA Place 2 sprays into the nose daily.   Yes Historical Provider, MD  lisinopril-hydrochlorothiazide (PRINZIDE,ZESTORETIC) 10-12.5 MG per tablet Take 1 tablet by mouth every three (3) days as needed. Reported on 12/24/2015   Yes Historical Provider, MD  pantoprazole (PROTONIX) 40 MG tablet Take 40 mg by mouth daily.   Yes Historical Provider, MD  RESTASIS 0.05 % ophthalmic emulsion  11/15/14  Yes Historical Provider, MD  SUMAtriptan (IMITREX) 50 MG tablet Take 50 mg by mouth every 2 (two) hours as needed for migraine. Reported on 04/29/2016   Yes Historical Provider, MD  SUPREP BOWEL PREP KIT 17.5-3.13-1.6 GM/180ML  SOLN Take 1 kit by mouth once. Brand Name Only.  No Substitutions.  Suprep Bowel Kit. 04/29/16  Yes Jerene Bears, MD  traMADol (ULTRAM) 50 MG tablet Take by mouth every 6 (six) hours as needed. Reported on 12/24/2015   Yes Historical Provider, MD   Social History   Social History  . Marital Status: Divorced    Spouse Name: N/A  . Number of Children: N/A  . Years of Education: N/A   Occupational History  . Not on file.   Social History Main Topics  . Smoking status: Former Smoker -- 0.25 packs/day for 30 years    Types: Cigarettes    Quit date: 04/22/2015  . Smokeless tobacco: Never Used  . Alcohol Use: Yes     Comment: Occasional  . Drug Use: No  . Sexual Activity: Not on file   Other Topics Concern  . Not on file   Social History Narrative   Review of Systems  Musculoskeletal: Positive for arthralgias.  Skin: Positive for rash.  Neurological: Positive for weakness.    Objective:  BP 138/84 mmHg  Pulse 90  Temp(Src) 98 F (36.7 C) (Oral)  Resp 18  Ht 5' 4"  (1.626 m)  Wt 183 lb (83.008 kg)  BMI 31.40 kg/m2  SpO2 94%  Physical Exam  Constitutional: She appears well-developed and well-nourished. No distress.  HENT:  Head: Normocephalic and atraumatic.  Eyes: Conjunctivae are normal.  Neck: Neck supple.  Cardiovascular: Normal rate.   Pulmonary/Chest: Effort normal.  Musculoskeletal:  Trace effusion on the left knee No significant warmth or erythema Crepitus, but FROM Tender along medial joint line Pain with McMurray with internal rotation Negative  varus, and valgus Negative Lachman  Neurological: She is alert.  Skin: Skin is warm and dry.  Few patches of erythema left lower leg, not blanchable  Psychiatric: She has a normal mood and affect. Her behavior is normal.  Nursing note and vitals reviewed.  No results found.  Assessment & Plan:   Kimberly King is a 65 y.o. female Left medial knee pain - Plan: DG Knee Complete 4 Views Left, Apply knee  sleeve  Possible flare of mild osteoarthritis versus meniscus injury. Minimal effusion, overall function intact without mechanical symptoms.  -Continue diclofenac twice a day, has tramadol if needed for breakthrough pain. OA Reaction Web unloader brace was applied, and has follow-up in 5 days with orthopedics. Can decide if injection versus further imaging needed at that time. RTC precautions if worsening.  No orders of the defined types were placed in this encounter.   Patient Instructions    You likely have a flare of the arthritis of your left knee, or  less likely a meniscus injury. Okay to continue the diclofenac twice per day, tramadol if needed for breakthrough pain, and try the unloader brace that was fitted today. Keep follow-up with Dr. Drema Dallas this week in case an injection is needed at that time. Try to limit wear and tear or excessive bending/twisting on that knee this week, but still continue range of motion and usual activities.  Return to the clinic or go to the nearest emergency room if any of your symptoms worsen or new symptoms occur.    IF you received an x-ray today, you will receive an invoice from [facility]. Please contact [facility] at [phone] with questions or concerns regarding your invoice.   IF you received labwork today, you will receive an invoice from [employer]. Please contact [facility] at [phone] with questions or concerns regarding your invoice.   Our billing staff will not be able to assist you with questions regarding bills from these companies.  You will be contacted with the lab results as soon as they are available. The fastest way to get your results is to activate your My Chart account. Instructions are located on the last page of this paperwork. If you have not heard from [place] regarding the results in 2 weeks, please contact this office.         I personally performed the services described in  this documentation, which was scribed in my presence. The recorded information has been reviewed and considered, and addended by me as needed.   Signed,   Merri Ray, MD [facility].  04/30/2016 10:37 AM

## 2016-04-30 NOTE — Patient Instructions (Addendum)
You likely have a flare of the arthritis of your left knee, or less likely a meniscus injury. Okay to continue the diclofenac twice per day, tramadol if needed for breakthrough pain, and try the unloader brace that was fitted today. Keep follow-up with Kimberly King this week in case an injection is needed at that time. Try to limit wear and tear or excessive bending/twisting on that knee this week, but still continue range of motion and usual activities.  Return to the clinic or go to the nearest emergency room if any of your symptoms worsen or new symptoms occur.    IF you received an x-ray today, you will receive an invoice from [facility]. Please contact [facility] at [phone] with questions or concerns regarding your invoice.   IF you received labwork today, you will receive an invoice from [employer]. Please contact [facility] at [phone] with questions or concerns regarding your invoice.   Our billing staff will not be able to assist you with questions regarding bills from these companies.  You will be contacted with the lab results as soon as they are available. The fastest way to get your results is to activate your My Chart account. Instructions are located on the last page of this paperwork. If you have not heard from [place] regarding the results in 2 weeks, please contact this office.

## 2016-05-02 ENCOUNTER — Encounter: Payer: Self-pay | Admitting: Internal Medicine

## 2016-05-12 ENCOUNTER — Ambulatory Visit (AMBULATORY_SURGERY_CENTER): Payer: BLUE CROSS/BLUE SHIELD | Admitting: Internal Medicine

## 2016-05-12 ENCOUNTER — Encounter: Payer: Self-pay | Admitting: Internal Medicine

## 2016-05-12 VITALS — BP 145/92 | HR 60 | Temp 98.2°F | Resp 16 | Ht 63.0 in | Wt 184.0 lb

## 2016-05-12 DIAGNOSIS — D127 Benign neoplasm of rectosigmoid junction: Secondary | ICD-10-CM | POA: Diagnosis not present

## 2016-05-12 DIAGNOSIS — Z86010 Personal history of colonic polyps: Secondary | ICD-10-CM | POA: Diagnosis not present

## 2016-05-12 DIAGNOSIS — D122 Benign neoplasm of ascending colon: Secondary | ICD-10-CM

## 2016-05-12 DIAGNOSIS — K635 Polyp of colon: Secondary | ICD-10-CM

## 2016-05-12 MED ORDER — SODIUM CHLORIDE 0.9 % IV SOLN: 500.0000 mL | INTRAVENOUS | Status: DC

## 2016-05-12 NOTE — Progress Notes (Signed)
Called to room to assist during endoscopic procedure.  Patient ID and intended procedure confirmed with present staff. Received instructions for my participation in the procedure from the performing physician.  

## 2016-05-12 NOTE — Patient Instructions (Signed)
YOU HAD AN ENDOSCOPIC PROCEDURE TODAY AT [facility]:   Refer to the procedure report that was given to you for any specific questions about what was found during the examination.  If the procedure report does not answer your questions, please call your gastroenterologist to clarify.  If you requested that your care partner not be given the details of your procedure findings, then the procedure report has been included in a sealed envelope for you to review at your convenience later.  YOU SHOULD EXPECT: Some feelings of bloating in the abdomen. Passage of more gas than usual.  Walking can help get rid of the air that was put into your GI tract during the procedure and reduce the bloating. If you had a lower endoscopy (such as a colonoscopy or flexible sigmoidoscopy) you may notice spotting of blood in your stool or on the toilet paper. If you underwent a bowel prep for your procedure, you may not have a normal bowel movement for a few days.  Please Note:  You might notice some irritation and congestion in your nose or some drainage.  This is from the oxygen used during your procedure.  There is no need for concern and it should clear up in a day or so.  SYMPTOMS TO REPORT IMMEDIATELY:   Following lower endoscopy (colonoscopy or flexible sigmoidoscopy):  Excessive amounts of blood in the stool  Significant tenderness or worsening of abdominal pains  Swelling of the abdomen that is new, acute  Fever of 100F or higher  For urgent or emergent issues, a gastroenterologist can be reached at any hour by calling [phone].   DIET: Your first meal following the procedure should be a small meal and then it is ok to progress to your normal diet. Heavy or fried foods are harder to digest and may make you feel nauseous or bloated.  Likewise, meals heavy in dairy and vegetables can increase bloating.  Drink plenty of fluids but you should avoid alcoholic beverages for 24  hours.  ACTIVITY:  You should plan to take it easy for the rest of today and you should NOT DRIVE or use heavy machinery until tomorrow (because of the sedation medicines used during the test).    FOLLOW UP: Our staff will call the number listed on your records the next business day following your procedure to check on you and address any questions or concerns that you may have regarding the information given to you following your procedure. If we do not reach you, we will leave a message.  However, if you are feeling well and you are not experiencing any problems, there is no need to return our call.  We will assume that you have returned to your regular daily activities without incident.  If any biopsies were taken you will be contacted by phone or by letter within the next 1-3 weeks.  Please call us at [phone] if you have not heard about the biopsies in 3 weeks.    SIGNATURES/CONFIDENTIALITY: You and/or your care partner have signed paperwork which will be entered into your electronic medical record.  These signatures attest to the fact that that the information above on your After Visit Summary has been reviewed and is understood.  Full responsibility of the confidentiality of this discharge information lies with you and/or your care-partner.  Continue present medications. Polyps and hemorrhoids handout provided.

## 2016-05-12 NOTE — Op Note (Signed)
[facility] Patient Name: Kimberly King Procedure Date: 05/12/2016 8:29 AM MRN: [id number] Endoscopist: Jerene Bears , MD Age: 65 Referring MD:  Date of Birth: 03-04-1951 Gender: Female Account #: [id number] Procedure:                Colonoscopy Indications:              Surveillance: Personal history of adenomatous                            polyps on last colonoscopy 3 years ago Medicines:                Monitored Anesthesia Care Procedure:                Pre-Anesthesia Assessment:                           - Prior to the procedure, a History and Physical                            was performed, and patient medications and                            allergies were reviewed. The patient's tolerance of                            previous anesthesia was also reviewed. The risks                            and benefits of the procedure and the sedation                            options and risks were discussed with the patient.                            All questions were answered, and informed consent                            was obtained. Prior Anticoagulants: The patient has                            taken no previous anticoagulant or antiplatelet                            agents. ASA Grade Assessment: III - A patient with                            severe systemic disease. After reviewing the risks                            and benefits, the patient was deemed in                            satisfactory condition to undergo the procedure.  After obtaining informed consent, the colonoscope                            was passed under direct vision. Throughout the                            procedure, the patient's blood pressure, pulse, and                            oxygen saturations were monitored continuously. The                            Model PCF-H190L [id number]) scope was introduced                            through the anus and  advanced to the the cecum,                            identified by appendiceal orifice and ileocecal                            valve. The colonoscopy was performed without                            difficulty. The patient tolerated the procedure                            well. The quality of the bowel preparation was                            good. The ileocecal valve, appendiceal orifice, and                            rectum were photographed. Scope In: 8:40:13 AM Scope Out: 8:57:00 AM Scope Withdrawal Time: 0 hours 13 minutes 15 seconds  Total Procedure Duration: 0 hours 16 minutes 47 seconds  Findings:                 The digital rectal exam was normal.                           A 5 mm polyp was found in the ascending colon. The                            polyp was sessile. The polyp was removed with a                            cold snare. Resection and retrieval were complete.                           Two sessile polyps were found in the recto-sigmoid                            colon. The polyps were 3 to  4 mm in size. These                            polyps were removed with a cold snare. Resection                            and retrieval were complete.                           Internal hemorrhoids were found during                            retroflexion. The hemorrhoids were medium-sized.                           The exam was otherwise without abnormality. Complications:            No immediate complications. Estimated Blood Loss:     Estimated blood loss: none. Impression:               - One 5 mm polyp in the ascending colon, removed                            with a cold snare. Resected and retrieved.                           - Two 3 to 4 mm polyps at the recto-sigmoid colon,                            removed with a cold snare. Resected and retrieved.                           - Internal hemorrhoids.                           - The examination was otherwise  normal. Recommendation:           - Patient has a contact number available for                            emergencies. The signs and symptoms of potential                            delayed complications were discussed with the                            patient. Return to normal activities tomorrow.                            Written discharge instructions were provided to the                            patient.                           - Resume previous diet.                           -  Continue present medications.                           - Await pathology results.                           - Repeat colonoscopy is recommended for adenoma                            surveillance. The colonoscopy date will be                            determined after pathology results from today's                            exam become available for review. Jerene Bears, MD 05/12/2016 9:00:23 AM This report has been signed electronically.

## 2016-05-12 NOTE — Progress Notes (Signed)
Stable to RR 

## 2016-05-13 ENCOUNTER — Telehealth: Payer: Self-pay

## 2016-05-13 NOTE — Telephone Encounter (Signed)
  Follow up Call-  Call back number 05/12/2016  Post procedure Call Back phone  # [phone]  Permission to leave phone message Yes  Some recent data might be hidden     Patient questions:  Do you have a fever, pain , or abdominal swelling? No. Pain Score  0 *  Have you tolerated food without any problems? Yes.    Have you been able to return to your normal activities? Yes.    Do you have any questions about your discharge instructions: Diet   No. Medications  No. Follow up visit  No.  Do you have questions or concerns about your Care? No.  Actions: * If pain score is 4 or above: No action needed, pain <4.

## 2016-05-23 ENCOUNTER — Encounter: Payer: Self-pay | Admitting: Internal Medicine

## 2016-06-22 ENCOUNTER — Encounter: Payer: Self-pay | Admitting: Physician Assistant

## 2016-06-22 ENCOUNTER — Ambulatory Visit (INDEPENDENT_AMBULATORY_CARE_PROVIDER_SITE_OTHER): Payer: BLUE CROSS/BLUE SHIELD | Admitting: Physician Assistant

## 2016-06-22 VITALS — BP 122/80 | HR 60 | Ht 64.0 in | Wt 176.0 lb

## 2016-06-22 DIAGNOSIS — R140 Abdominal distension (gaseous): Secondary | ICD-10-CM

## 2016-06-22 DIAGNOSIS — R1013 Epigastric pain: Secondary | ICD-10-CM

## 2016-06-22 DIAGNOSIS — K219 Gastro-esophageal reflux disease without esophagitis: Secondary | ICD-10-CM

## 2016-06-22 DIAGNOSIS — K648 Other hemorrhoids: Secondary | ICD-10-CM

## 2016-06-22 MED ORDER — PANTOPRAZOLE SODIUM 40 MG PO TBEC: 40.0000 mg | DELAYED_RELEASE_TABLET | Freq: Two times a day (BID) | ORAL | 3 refills | Status: DC

## 2016-06-22 NOTE — Progress Notes (Addendum)
Chief Complaint: GERD  HPI:  Kimberly King is a 65 year old female, with past medical history of GERD, hypertension, CLL and anxiety, who follows with Dr. Hilarie Fredrickson for regular screening colonoscopies, who returns to clinic today for a complaint of reflux.   Per record review patient recently had a screening colonoscopy completed on 05/12/2016. Independent review of report and images shows one 5 mm polyp in the ascending colon, 2 3-4 mm polyps at the rectosigmoid colon, internal hemorrhoids and otherwise normal exam. Pathology revealed a sessile serrated polyp and hyperplastic polyp. Repeat colonoscopy is recommended in 5 years.   It also appears the patient was placed on diclofenac twice a day on 04/30/16 for some knee pain by her primary care provider.   Today, the patient explains that she grew hoarse sround the beginning of the summer and thought this was just related to her allergies, this hascontinued since around July. She did go to see an ENT who did a laryngoscope and told her that she had reflux. The patient has been on Protonix 40 mg daily for "almost 20 years", but does continue to experience daily heartburn and reflux symptoms which have worsened over the past 2 months. The patient explains that she has woken from sleep, early in the morning on multiple occasions with reflux in her mouth. She also had set her, and care provider placed her on diclofenac twice daily a month and half ago for arthritis pain, she has decreased this to once daily recently as she knew ulcers and epigastric pain could be a side effect. Time the patient used multiple other NSAIDs. Associated symptoms include epigastric pain, nausea and bloating.   Patient also tells me that Dr. Hilarie Fredrickson told her that her hemorrhoids could be banded, she is interested in more information regarding this. She is not currently experiencing any bleeding, but tells me this occurs at least twice a month, she typically does not have discomfort from  them.   Past medical history is positive for an EGD 8-10 yrs ago with Dr. Collene Mares. Patient denies vomiting, dysphagia, change in bowel habits, blood in her stool, weight loss, fatigue or anorexia.   Past Medical History:  Diagnosis Date  . Allergy   . Anxiety   . Arthritis    knees hips neck  . Cancer [facility])    leukemia dx 06/2013 - no current treatment - being monitored  . Cataract    bilateral - being monitored, no treatment currently  . Depression   . GERD (gastroesophageal reflux disease)   . Hypertension   . Leukocytosis    and low blood sodium   . Migraines   . Smoker     Past Surgical History:  Procedure Laterality Date  . APPENDECTOMY    . BLADDER SURGERY     tack  . CERVICAL FUSION     x2  -  C5-C6  . COLONOSCOPY  2014   pyrtle - hx polyps  . DILATION AND CURETTAGE OF UTERUS     x 2  . TONSILLECTOMY      Current Outpatient Prescriptions  Medication Sig Dispense Refill  . ALPRAZolam (XANAX) 0.25 MG tablet Take 0.25 mg by mouth as needed for anxiety.    . Calcium Carbonate-Vitamin D (CALCIUM + D PO) Take 2 tablets by mouth daily.    . cetirizine (ZYRTEC) 10 MG tablet Take 10 mg by mouth daily as needed for allergies.    Marland Kitchen diclofenac (VOLTAREN) 75 MG EC tablet Take 75 mg by mouth  2 (two) times daily.  3  . diphenhydrAMINE (BENADRYL) 25 MG tablet Take 25 mg by mouth at bedtime as needed (for insomnia). Reported on 04/29/2016    . FLUTICASONE PROPIONATE, NASAL, NA Place 2 sprays into the nose daily.    Marland Kitchen lidocaine (LIDODERM) 5 %     . lisinopril-hydrochlorothiazide (PRINZIDE,ZESTORETIC) 10-12.5 MG per tablet Take 1 tablet by mouth every three (3) days as needed. Reported on 12/24/2015    . pantoprazole (PROTONIX) 40 MG tablet Take 40 mg by mouth daily.    . RESTASIS 0.05 % ophthalmic emulsion   4  . SUMAtriptan (IMITREX) 50 MG tablet Take 50 mg by mouth every 2 (two) hours as needed for migraine. Reported on 04/29/2016    . traMADol (ULTRAM) 50 MG tablet Take by mouth  every 6 (six) hours as needed. Reported on 12/24/2015     Current Facility-Administered Medications  Medication Dose Route Frequency Provider Last Rate Last Dose  . 0.9 %  sodium chloride infusion  500 mL Intravenous Continuous Jerene Bears, MD        Allergies as of 06/22/2016  . (No Known Allergies)    Family History  Problem Relation Age of Onset  . Colon cancer Paternal Grandmother 35  . Esophageal cancer Paternal Uncle   . Heart disease Father   . Lung cancer Father   . Breast cancer Maternal Grandfather   . Rectal cancer Neg Hx   . Stomach cancer Neg Hx     Social History   Social History  . Marital status: Divorced    Spouse name: N/A  . Number of children: N/A  . Years of education: N/A   Occupational History  . Not on file.   Social History Main Topics  . Smoking status: Former Smoker    Packs/day: 0.25    Years: 30.00    Types: Cigarettes    Quit date: 04/22/2015  . Smokeless tobacco: Never Used  . Alcohol use Yes     Comment: Occasional  . Drug use: No  . Sexual activity: Not on file   Other Topics Concern  . Not on file   Social History Narrative  . No narrative on file    Review of Systems:     Constitutional: No weight loss, fever, chills, weakness or fatigue HEENT: Eyes: No change in vision               Ears, Nose, Throat:  No change in hearing or congestion Skin: No rash or itching Cardiovascular: No chest pain, chest pressure or palpitations   Respiratory: No SOB or cough Gastrointestinal: See HPI and otherwise negative Genitourinary: No dysuria or change in urinary frequency Neurological: No headache, dizziness or syncope Musculoskeletal: No new muscle or joint pain Hematologic: No bleeding or bruising Psychiatric: No history of depression or anxiety    Physical Exam:  Vital signs: .BP 122/80   Pulse 60   Ht 5\' 4"  (1.626 m)   Wt 176 lb (79.8 kg)   BMI 30.21 kg/m  General:   Pleasant Caucasian female appears to be in NAD, Well  developed, Well nourished, alert and cooperative Head:  Normocephalic and atraumatic. Eyes:   PEERL, EOMI. No icterus. Conjunctiva pink. Ears:  Normal auditory acuity. Neck:  Supple Throat: Oral cavity and pharynx without inflammation, swelling or lesion. Teeth in good condition. Lungs: Respirations even and unlabored. Lungs clear to auscultation bilaterally.   No wheezes, crackles, or rhonchi.  Heart: Normal S1, S2. No MRG.  Regular rate and rhythm. No peripheral edema, cyanosis or pallor.  Abdomen:  Soft, nondistended, mild TTP and epigastrium No rebound or guarding. Normal bowel sounds. No appreciable masses or hepatomegaly. Rectal:  Not performed.  Msk:  Symmetrical without gross deformities.  Extremities:  Without edema, no deformity or joint abnormality.  Neurologic:  Alert and  oriented x4;  grossly normal neurologically.  Skin:   Dry and intact without significant lesions or rashes. Psychiatric: Oriented to person, place and time. Demonstrates good judgement and reason without abnormal affect or behaviors.  No recent labs or imaging  Assessment: 1. GERD: Patient reports being on Pantoprazole 40 mg daily for over 10 years, but within the past 2-3 months she has been experiencing daily heartburn and reflux symptoms along with epigastric pain and nausea, history of NSAID use; consider gastritis versus PUD versus H. pylori versus other 2. Epigastric pain: Clearly related to above  3. Internal hemorrhoids: Patient will like to discuss banding, per Dr. Vena Rua conditions at time of recent colonoscopy 4. Bloating: Likely related to epigastric discomforts and reflux  Plan: 1. Recommend EGD for further evaluation. Discussed risks, benefits, limitations and alternatives this procedure and the patient agrees to proceed. She was scheduled with Dr. Hilarie Fredrickson. 2. Increased patient's Pantoprazole to 40 mg twice a day, 30-60 minutes before eating. 3. Reviewed antireflux lifestyle and diet measures.  Encouraged her to decrease/discuss other options for diclofenac with her primary care provider.  4. Schedule patient for banding procedure at next available time slot, she will decide between now and then whether she wants to proceed with this. We did discuss the procedure in detail today. 5. Patient will follow in clinic per Dr. Vena Rua recommendations after time procedure.  Ellouise Newer, PA-C [facility] 06/22/2016, 8:39 AM  Cc: [name], MD   Addendum: Reviewed and agree with management. Jerene Bears, MD

## 2016-06-22 NOTE — Patient Instructions (Signed)
You have been scheduled for an endoscopy. Please follow written instructions given to you at your visit today. If you use inhalers (even only as needed), please bring them with you on the day of your procedure. Your physician has requested that you go to www.startemmi.com and enter the access code given to you at your visit today. This web site gives a general overview about your procedure. However, you should still follow specific instructions given to you by our office regarding your preparation for the procedure.  We have sent the following medications to your pharmacy for you to pick up at your convenience: Pantoprazole 40 mg twice a day 30-60 mins before eating.

## 2016-06-23 ENCOUNTER — Other Ambulatory Visit: Payer: Self-pay | Admitting: Physician Assistant

## 2016-06-23 ENCOUNTER — Ambulatory Visit (INDEPENDENT_AMBULATORY_CARE_PROVIDER_SITE_OTHER): Payer: BLUE CROSS/BLUE SHIELD | Admitting: Physician Assistant

## 2016-06-23 ENCOUNTER — Ambulatory Visit: Payer: BLUE CROSS/BLUE SHIELD | Admitting: Internal Medicine

## 2016-06-23 ENCOUNTER — Other Ambulatory Visit: Payer: BLUE CROSS/BLUE SHIELD

## 2016-06-23 VITALS — BP 138/88 | HR 74 | Temp 97.9°F | Ht 64.0 in | Wt 174.0 lb

## 2016-06-23 DIAGNOSIS — Z23 Encounter for immunization: Secondary | ICD-10-CM | POA: Diagnosis not present

## 2016-06-23 DIAGNOSIS — R300 Dysuria: Secondary | ICD-10-CM | POA: Diagnosis not present

## 2016-06-23 LAB — POC MICROSCOPIC URINALYSIS (UMFC): Mucus: ABSENT

## 2016-06-23 LAB — POCT URINALYSIS DIP (MANUAL ENTRY)
BILIRUBIN UA: NEGATIVE
Glucose, UA: NEGATIVE
Ketones, POC UA: NEGATIVE
Leukocytes, UA: NEGATIVE
NITRITE UA: NEGATIVE
PH UA: 6
Protein Ur, POC: NEGATIVE
UROBILINOGEN UA: 0.2

## 2016-06-23 MED ORDER — CIPROFLOXACIN HCL 500 MG PO TABS: 500.0000 mg | ORAL_TABLET | Freq: Two times a day (BID) | ORAL | 0 refills | Status: AC

## 2016-06-23 NOTE — Progress Notes (Signed)
[facility] [address], [place] [address] [phone]  By signing my name below, I, Kimberly King, attest that this documentation has been prepared under the direction and in the presence of Ivar Drape, [place]  Electronically Signed: Verlee Monte, Medical Scribe. 06/23/16. 5:37 PM.  Date:  06/23/2016   Name:  Kimberly King   DOB:  Jun 16, 1951   MRN:  [id number]  PCP:  [name], MD   Chief Complaint  Patient presents with   Urinary Frequency    with terible odor  -  since Sunday     History of Present Illness:  Kimberly King is a 65 y.o. female patient who presents to [facility] complains of urinary frequency onset 4 days ago. Pt reports associated symptoms of urgent urination, odor to urine, suprapubic pain, some dysuria, and nausea. Pt states every year during her physical her provider tells her she has a UTI but since she has been asymptomatic she doesn't know. Pt denies injury to her kidneys and recently taking abx. Pt denies back pain, hematuria, and vaginal discharge.  Pt has been hoarse for 6 month and has GERD. Pt reports coughing and feeling a cold coming on. Pt went to the GI doctor yesterday for her coughing and abdominal pain. Pt also has CLL and is going to her oncologist tomorrow for her 6 month check up.  Patient Active Problem List   Diagnosis Date Noted   Elevated WBC count 10/27/2015   CLL (chronic lymphocytic leukemia) ([place]) 12/17/2013   Chronic lymphocytic leukemia ([place]) 05/16/2013   DERMATOPHYTOSIS OF NAIL 12/31/2007   INSOMNIA UNSPECIFIED 12/31/2007   COLONIC POLYPS, ADENOMATOUS 10/26/2007   HEMORRHOIDS, INTERNAL 10/26/2007   NECK PAIN 03/07/2007   BACK PAIN, LUMBAR 03/07/2007    Past Medical History:  Diagnosis Date   Allergy    Anxiety    Arthritis    knees hips neck   Cancer ([place])    leukemia dx 06/2013 - no current treatment - being monitored   Cataract    bilateral - being monitored, no treatment  currently   Depression    GERD (gastroesophageal reflux disease)    Hypertension    Leukocytosis    and low blood sodium    Migraines    Smoker     Past Surgical History:  Procedure Laterality Date   APPENDECTOMY     BLADDER SURGERY     tack   CERVICAL FUSION     x2  -  C5-C6   COLONOSCOPY  2014   pyrtle - hx polyps   DILATION AND CURETTAGE OF UTERUS     x 2   TONSILLECTOMY      Social History  Substance Use Topics   Smoking status: Former Smoker    Packs/day: 0.25    Years: 30.00    Types: Cigarettes    Quit date: 04/22/2015   Smokeless tobacco: Never Used   Alcohol use Yes     Comment: Occasional    Family History  Problem Relation Age of Onset   Colon cancer Paternal Grandmother 51   Esophageal cancer Paternal Uncle    Heart disease Father    Lung cancer Father    Lung cancer Maternal Grandfather    Breast cancer Maternal Grandmother    Rectal cancer Neg Hx    Stomach cancer Neg Hx     No Known Allergies  Medication list has been reviewed and updated.  Current Outpatient Prescriptions on File Prior  to Visit  Medication Sig Dispense Refill   ALPRAZolam (XANAX) 0.25 MG tablet Take 0.25 mg by mouth as needed for anxiety.     Calcium Carbonate-Vitamin D (CALCIUM + D PO) Take 2 tablets by mouth daily.     cetirizine (ZYRTEC) 10 MG tablet Take 10 mg by mouth daily as needed for allergies.     diphenhydrAMINE (BENADRYL) 25 MG tablet Take 25 mg by mouth at bedtime as needed (for insomnia). Reported on 04/29/2016     FLUTICASONE PROPIONATE, NASAL, NA Place 2 sprays into the nose daily.     lidocaine (LIDODERM) 5 %      lisinopril-hydrochlorothiazide (PRINZIDE,ZESTORETIC) 10-12.5 MG per tablet Take 1 tablet by mouth every three (3) days as needed. Reported on 12/24/2015     pantoprazole (PROTONIX) 40 MG tablet Take 1 tablet (40 mg total) by mouth 2 (two) times daily before a meal. 60 tablet 3   RESTASIS 0.05 % ophthalmic emulsion   4    SUMAtriptan (IMITREX) 50 MG tablet Take 50 mg by mouth every 2 (two) hours as needed for migraine. Reported on 04/29/2016     traMADol (ULTRAM) 50 MG tablet Take by mouth every 6 (six) hours as needed. Reported on 12/24/2015     diclofenac (VOLTAREN) 75 MG EC tablet Take 75 mg by mouth 2 (two) times daily.  3   Current Facility-Administered Medications on File Prior to Visit  Medication Dose Route Frequency Provider Last Rate Last Dose   0.9 %  sodium chloride infusion  500 mL Intravenous Continuous Jerene Bears, MD        Review of Systems  Gastrointestinal: Positive for abdominal pain and nausea.  Genitourinary: Positive for dysuria, frequency and urgency. Negative for hematuria.  Musculoskeletal: Negative for back pain.     Physical Examination: BP 138/88 (BP Location: Right Arm, Patient Position: Sitting, Cuff Size: Large)    Pulse 74    Temp 97.9 F (36.6 C) (Oral)    Ht 5\' 4"  (1.626 m)    Wt 174 lb (78.9 kg)    SpO2 95%    BMI 29.87 kg/m  Ideal Body Weight: @FLOWAMB [id number]  Physical Exam  Constitutional: She is oriented to person, place, and time. She appears well-developed and well-nourished. No distress.  HENT:  Head: Normocephalic and atraumatic.  Right Ear: External ear normal.  Left Ear: External ear normal.  Eyes: Conjunctivae and EOM are normal. Pupils are equal, round, and reactive to light.  Cardiovascular: Normal rate, regular rhythm and normal heart sounds.  Exam reveals no friction rub.   No murmur heard. Pulmonary/Chest: Effort normal and breath sounds normal. No respiratory distress. She has no wheezes. She has no rales.  Abdominal: There is tenderness in the suprapubic area. There is no CVA tenderness.  Left flank tenderness  Neurological: She is alert and oriented to person, place, and time.  Skin: She is not diaphoretic.  Psychiatric: She has a normal mood and affect. Her behavior is normal.    Results for orders placed or performed in visit on  06/23/16  POCT urinalysis dipstick  Result Value Ref Range   Color, UA yellow yellow   Clarity, UA clear clear   Glucose, UA negative negative   Bilirubin, UA negative negative   Ketones, POC UA negative negative   Spec Grav, UA <=1.005    Blood, UA trace-lysed (A) negative   pH, UA 6.0    Protein Ur, POC negative negative   Urobilinogen, UA 0.2  Nitrite, UA Negative Negative   Leukocytes, UA Negative Negative  POCT Microscopic Urinalysis ([facility])  Result Value Ref Range   WBC,UR,HPF,POC Moderate (A) None WBC/hpf   RBC,UR,HPF,POC None None RBC/hpf   Bacteria Moderate (A) None, Too numerous to count   Mucus Absent Absent   Epithelial Cells, UR Per Microscopy Few (A) None, Too numerous to count cells/hpf   Assessment and Plan: Kimberly King is a 65 y.o. female who is here today For urinary frequency and malodorous odor. 3 history, this appears to be UTI though not specific on urine analysis. I will start her on topotecan at this time. We will run a urine culture and advised to discontinue mid space on the results. Dysuria - Plan: POCT urinalysis dipstick, POCT Microscopic Urinalysis ([facility]), Urine culture, CANCELED: Urine culture  Need for influenza vaccination - Plan: Flu Vaccine QUAD 36+ mos IM  Flu vaccine given today.  I personally performed the services described in this documentation, which was scribed in my presence. The recorded information has been reviewed and is accurate.

## 2016-06-23 NOTE — Patient Instructions (Addendum)
Please take medication as prescribed.  I will contact you with the results of your urine culture shortly.  Please take tylenol for pain, and make sure you are hydrating well.     IF you received an x-ray today, you will receive an invoice from [facility]. Please contact [facility] at [phone] with questions or concerns regarding your invoice.   IF you received labwork today, you will receive an invoice from [employer]. Please contact [facility] at [phone] with questions or concerns regarding your invoice.   Our billing staff will not be able to assist you with questions regarding bills from these companies.  You will be contacted with the lab results as soon as they are available. The fastest way to get your results is to activate your My Chart account. Instructions are located on the last page of this paperwork. If you have not heard from [place] regarding the results in 2 weeks, please contact this office.

## 2016-06-25 LAB — URINE CULTURE: Colony Count: >=100000

## 2016-06-28 ENCOUNTER — Encounter: Payer: Self-pay | Admitting: Internal Medicine

## 2016-06-28 ENCOUNTER — Ambulatory Visit (HOSPITAL_BASED_OUTPATIENT_CLINIC_OR_DEPARTMENT_OTHER): Payer: BLUE CROSS/BLUE SHIELD | Admitting: Internal Medicine

## 2016-06-28 ENCOUNTER — Telehealth: Payer: Self-pay | Admitting: Internal Medicine

## 2016-06-28 ENCOUNTER — Other Ambulatory Visit (HOSPITAL_BASED_OUTPATIENT_CLINIC_OR_DEPARTMENT_OTHER): Payer: BLUE CROSS/BLUE SHIELD

## 2016-06-28 VITALS — BP 112/66 | HR 74 | Temp 98.3°F | Resp 18 | Ht 64.0 in | Wt 172.5 lb

## 2016-06-28 DIAGNOSIS — C9110 Chronic lymphocytic leukemia of B-cell type not having achieved remission: Secondary | ICD-10-CM

## 2016-06-28 LAB — CBC WITH DIFFERENTIAL/PLATELET
BASO%: 0.8 % (ref 0.0–2.0)
BASOS ABS: 0.2 10*3/uL — AB (ref 0.0–0.1)
EOS ABS: 0.2 10*3/uL (ref 0.0–0.5)
EOS%: 1.2 % (ref 0.0–7.0)
HEMATOCRIT: 37 % (ref 34.8–46.6)
HEMOGLOBIN: 11.9 g/dL (ref 11.6–15.9)
LYMPH#: 10.8 10*3/uL — AB (ref 0.9–3.3)
LYMPH%: 57 % — ABNORMAL HIGH (ref 14.0–49.7)
MCH: 28.1 pg (ref 25.1–34.0)
MCHC: 32.2 g/dL (ref 31.5–36.0)
MCV: 87.1 fL (ref 79.5–101.0)
MONO#: 1.2 10*3/uL — AB (ref 0.1–0.9)
MONO%: 6.3 % (ref 0.0–14.0)
NEUT%: 34.7 % — ABNORMAL LOW (ref 38.4–76.8)
NEUTROS ABS: 6.5 10*3/uL (ref 1.5–6.5)
PLATELETS: 351 10*3/uL (ref 145–400)
RBC: 4.25 10*6/uL (ref 3.70–5.45)
RDW: 15.3 % — AB (ref 11.2–14.5)
WBC: 18.9 10*3/uL — ABNORMAL HIGH (ref 3.9–10.3)

## 2016-06-28 LAB — COMPREHENSIVE METABOLIC PANEL
ALBUMIN: 4 g/dL (ref 3.5–5.0)
ALK PHOS: 101 U/L (ref 40–150)
ALT: 37 U/L (ref 0–55)
ANION GAP: 9 meq/L (ref 3–11)
AST: 25 U/L (ref 5–34)
BILIRUBIN TOTAL: 0.31 mg/dL (ref 0.20–1.20)
BUN: 14 mg/dL (ref 7.0–26.0)
CALCIUM: 9.7 mg/dL (ref 8.4–10.4)
CO2: 28 mEq/L (ref 22–29)
Chloride: 100 mEq/L (ref 98–109)
Creatinine: 0.9 mg/dL (ref 0.6–1.1)
EGFR: 69 mL/min/{1.73_m2} — AB (ref >90)
Glucose: 100 mg/dl (ref 70–140)
POTASSIUM: 3.6 meq/L (ref 3.5–5.1)
Sodium: 137 mEq/L (ref 136–145)
TOTAL PROTEIN: 7.1 g/dL (ref 6.4–8.3)

## 2016-06-28 LAB — LACTATE DEHYDROGENASE: LDH: 164 U/L (ref 125–245)

## 2016-06-28 NOTE — Progress Notes (Signed)
[place] [phone]   [phone]  OFFICE PROGRESS NOTE  [name], MD [place] [address]  DIAGNOSIS: Stage 0 chronic lymphocytic leukemia  PRIOR THERAPY: None  CURRENT THERAPY: Observation.  INTERVAL HISTORY: Kimberly King 65 y.o. female returns to the clinic today for six-month followup visit. The patient is doing well today with no specific complaints except for some hoarseness of her voice started few weeks ago. She was seen by ENT and currently treated for acid reflux. She denied having any significant weight loss or night sweats. She has no chest pain, shortness of breath, cough or hemoptysis. She denied having any significant nausea or vomiting, no fever or chills. She has no palpable lymphadenopathy. She had repeat CBC performed earlier today and she is here for evaluation and discussion of her lab results.  MEDICAL HISTORY: Past Medical History:  Diagnosis Date  . Allergy   . Anxiety   . Arthritis    knees hips neck  . Cancer [facility])    leukemia dx 06/2013 - no current treatment - being monitored  . Cataract    bilateral - being monitored, no treatment currently  . Depression   . GERD (gastroesophageal reflux disease)   . Hypertension   . Leukocytosis    and low blood sodium   . Migraines   . Smoker     ALLERGIES:  has No Known Allergies.  MEDICATIONS:  Current Outpatient Prescriptions  Medication Sig Dispense Refill  . Na Sulfate-K Sulfate-Mg Sulf (SUPREP BOWEL PREP KIT) 17.5-3.13-1.6 GM/180ML SOLN USE AS DIRECTED    . ALPRAZolam (XANAX) 0.25 MG tablet Take 0.25 mg by mouth as needed for anxiety.    . Calcium Carb-Ergocalciferol 250-125 MG-UNIT TABS Take by mouth.    . Calcium Carbonate-Vitamin D (CALCIUM + D PO) Take 2 tablets by mouth daily.    . cetirizine (ZYRTEC) 10 MG tablet Take 10 mg by mouth daily as needed for allergies.    . ciprofloxacin (CIPRO) 500 MG tablet Take 1 tablet (500 mg total) by  mouth 2 (two) times daily. 14 tablet 0  . diclofenac (VOLTAREN) 75 MG EC tablet Take 75 mg by mouth 2 (two) times daily.  3  . diphenhydrAMINE (BENADRYL) 25 MG tablet Take 25 mg by mouth at bedtime as needed (for insomnia). Reported on 04/29/2016    . FLUTICASONE PROPIONATE, NASAL, NA Place 2 sprays into the nose daily.    Marland Kitchen lidocaine (LIDODERM) 5 %     . lidocaine in dextrose 5-7.5 % injection by Intrathecal route.    Marland Kitchen lisinopril-hydrochlorothiazide (PRINZIDE,ZESTORETIC) 10-12.5 MG per tablet Take 1 tablet by mouth every three (3) days as needed. Reported on 12/24/2015    . pantoprazole (PROTONIX) 40 MG tablet Take 1 tablet (40 mg total) by mouth 2 (two) times daily before a meal. 60 tablet 3  . RESTASIS 0.05 % ophthalmic emulsion   4  . SUMAtriptan (IMITREX) 50 MG tablet Take 50 mg by mouth every 2 (two) hours as needed for migraine. Reported on 04/29/2016    . traMADol (ULTRAM) 50 MG tablet Take by mouth every 6 (six) hours as needed. Reported on 12/24/2015     Current Facility-Administered Medications  Medication Dose Route Frequency Provider Last Rate Last Dose  . 0.9 %  sodium chloride infusion  500 mL Intravenous Continuous Jerene Bears, MD        SURGICAL HISTORY:  Past Surgical History:  Procedure Laterality  Date  . APPENDECTOMY    . BLADDER SURGERY     tack  . CERVICAL FUSION     x2  -  C5-C6  . COLONOSCOPY  2014   pyrtle - hx polyps  . DILATION AND CURETTAGE OF UTERUS     x 2  . TONSILLECTOMY      REVIEW OF SYSTEMS:  A comprehensive review of systems was negative except for: Ears, nose, mouth, throat, and face: positive for hoarseness   PHYSICAL EXAMINATION: General appearance: alert, cooperative and no distress Head: Normocephalic, without obvious abnormality, atraumatic Neck: no adenopathy, no JVD, supple, symmetrical, trachea midline and thyroid not enlarged, symmetric, no tenderness/mass/nodules Lymph nodes: Cervical, supraclavicular, and axillary nodes normal. Resp:  clear to auscultation bilaterally Back: symmetric, no curvature. ROM normal. No CVA tenderness. Cardio: regular rate and rhythm, S1, S2 normal, no murmur, click, rub or gallop GI: soft, non-tender; bowel sounds normal; no masses,  no organomegaly Extremities: extremities normal, atraumatic, no cyanosis or edema  ECOG PERFORMANCE STATUS: 0 - Asymptomatic  Blood pressure 112/66, pulse 74, temperature 98.3 F (36.8 C), temperature source Oral, resp. rate 18, height 5' 4"  (1.626 m), weight 172 lb 8 oz (78.2 kg), SpO2 95 %.  LABORATORY DATA: Lab Results  Component Value Date   WBC 18.9 (H) 06/28/2016   HGB 11.9 06/28/2016   HCT 37.0 06/28/2016   MCV 87.1 06/28/2016   PLT 351 06/28/2016      Chemistry      Component Value Date/Time   NA 138 12/24/2015 1040   K 3.9 12/24/2015 1040   CO2 27 12/24/2015 1040   BUN 21.4 12/24/2015 1040   CREATININE 1.0 12/24/2015 1040      Component Value Date/Time   CALCIUM 9.8 12/24/2015 1040   ALKPHOS 87 12/24/2015 1040   AST 23 12/24/2015 1040   ALT 29 12/24/2015 1040   BILITOT <0.30 12/24/2015 1040       RADIOGRAPHIC STUDIES: No results found.  ASSESSMENT AND PLAN: This is a very pleasant 65 years old white female with stage 0 chronic lymphocytic leukemia currently on observation.  CBC today showed stable disease with no significant progression. I discussed the lab result with the patient and recommended for her to continue on observation for now with repeat CBC and LDH in 6 months. She was advised to call immediately if she has any concerning symptoms in the interval.  The patient voices understanding of current disease status and treatment options and is in agreement with the current care plan.  All questions were answered. The patient knows to call the clinic with any problems, questions or concerns. We can certainly see the patient much sooner if necessary.  Disclaimer: This note was dictated with voice recognition software. Similar  sounding words can inadvertently be transcribed and may not be corrected upon review.

## 2016-06-28 NOTE — Telephone Encounter (Signed)
Avs report and appointment schedule given to patient per 06/28/16 los. °

## 2016-07-01 ENCOUNTER — Encounter: Payer: Self-pay | Admitting: Internal Medicine

## 2016-07-12 ENCOUNTER — Encounter: Payer: BLUE CROSS/BLUE SHIELD | Admitting: Internal Medicine

## 2016-07-14 ENCOUNTER — Encounter: Payer: Self-pay | Admitting: Internal Medicine

## 2016-07-14 ENCOUNTER — Ambulatory Visit (AMBULATORY_SURGERY_CENTER): Payer: BLUE CROSS/BLUE SHIELD | Admitting: Internal Medicine

## 2016-07-14 VITALS — BP 119/94 | HR 66 | Temp 98.0°F | Resp 11 | Ht 64.0 in | Wt 176.0 lb

## 2016-07-14 DIAGNOSIS — K219 Gastro-esophageal reflux disease without esophagitis: Secondary | ICD-10-CM

## 2016-07-14 MED ORDER — SODIUM CHLORIDE 0.9 % IV SOLN: 500.0000 mL | INTRAVENOUS | Status: DC

## 2016-07-14 NOTE — Progress Notes (Signed)
Teeth unchanged after procedure.A and O x3. Report to RN. Tolerated MAC anesthesia well. 

## 2016-07-14 NOTE — Patient Instructions (Signed)
Impressions/Recommendations:  Hiatal hernia handout given.  YOU HAD AN ENDOSCOPIC PROCEDURE TODAY AT [facility]:   Refer to the procedure report that was given to you for any specific questions about what was found during the examination.  If the procedure report does not answer your questions, please call your gastroenterologist to clarify.  If you requested that your care partner not be given the details of your procedure findings, then the procedure report has been included in a sealed envelope for you to review at your convenience later.  YOU SHOULD EXPECT: Some feelings of bloating in the abdomen. Passage of more gas than usual.  Walking can help get rid of the air that was put into your GI tract during the procedure and reduce the bloating. If you had a lower endoscopy (such as a colonoscopy or flexible sigmoidoscopy) you may notice spotting of blood in your stool or on the toilet paper. If you underwent a bowel prep for your procedure, you may not have a normal bowel movement for a few days.  Please Note:  You might notice some irritation and congestion in your nose or some drainage.  This is from the oxygen used during your procedure.  There is no need for concern and it should clear up in a day or so.  SYMPTOMS TO REPORT IMMEDIATELY:    Following upper endoscopy (EGD)  Vomiting of blood or coffee ground material  New chest pain or pain under the shoulder blades  Painful or persistently difficult swallowing  New shortness of breath  Fever of 100F or higher  Black, tarry-looking stools  For urgent or emergent issues, a gastroenterologist can be reached at any hour by calling [phone].   DIET:  We do recommend a small meal at first, but then you may proceed to your regular diet.  Drink plenty of fluids but you should avoid alcoholic beverages for 24 hours.  ACTIVITY:  You should plan to take it easy for the rest of today and you should NOT DRIVE or use heavy  machinery until tomorrow (because of the sedation medicines used during the test).    FOLLOW UP: Our staff will call the number listed on your records the next business day following your procedure to check on you and address any questions or concerns that you may have regarding the information given to you following your procedure. If we do not reach you, we will leave a message.  However, if you are feeling well and you are not experiencing any problems, there is no need to return our call.  We will assume that you have returned to your regular daily activities without incident.  If any biopsies were taken you will be contacted by phone or by letter within the next 1-3 weeks.  Please call us at [phone] if you have not heard about the biopsies in 3 weeks.    SIGNATURES/CONFIDENTIALITY: You and/or your care partner have signed paperwork which will be entered into your electronic medical record.  These signatures attest to the fact that that the information above on your After Visit Summary has been reviewed and is understood.  Full responsibility of the confidentiality of this discharge information lies with you and/or your care-partner.

## 2016-07-14 NOTE — Op Note (Signed)
[place] Patient Name: Kimberly King Procedure Date: 07/14/2016 10:15 AM MRN: [id number] Endoscopist: Jerene Bears , MD Age: 66 Referring MD:  Date of Birth: 11/25/1950 Gender: Female Account #: [id number] Procedure:                Upper GI endoscopy Indications:              Epigastric abdominal pain, long-standing                            Gastro-esophageal reflux disease Medicines:                Monitored Anesthesia Care Procedure:                Pre-Anesthesia Assessment:                           - Prior to the procedure, a History and Physical                            was performed, and patient medications and                            allergies were reviewed. The patient's tolerance of                            previous anesthesia was also reviewed. The risks                            and benefits of the procedure and the sedation                            options and risks were discussed with the patient.                            All questions were answered, and informed consent                            was obtained. Prior Anticoagulants: The patient has                            taken no previous anticoagulant or antiplatelet                            agents. ASA Grade Assessment: III - A patient with                            severe systemic disease. After reviewing the risks                            and benefits, the patient was deemed in                            satisfactory condition to undergo the procedure.  After obtaining informed consent, the endoscope was                            passed under direct vision. Throughout the                            procedure, the patient's blood pressure, pulse, and                            oxygen saturations were monitored continuously. The                            Model GIF-[id number]) scope was introduced                            through the mouth, and  advanced to the second part                            of duodenum. The upper GI endoscopy was                            accomplished without difficulty. The patient                            tolerated the procedure well. Scope In: Scope Out: Findings:                 A non-obstructing, partial, Schatzki ring                            (acquired) was found at the gastroesophageal                            junction.                           A 2 cm hiatal hernia was present.                           The exam of the esophagus was otherwise normal.                           Normal mucosa was found in the entire examined                            stomach.                           The examined duodenum was normal. Complications:            No immediate complications. Estimated Blood Loss:     Estimated blood loss: none. Impression:               - Non-obstructing partial Schatzki ring.                           - 2 cm hiatal hernia.                           -  Normal mucosa was found in the entire stomach.                            Expect epigastric pain NSAID related.                           - Normal examined duodenum.                           - No specimens collected. Recommendation:           - Patient has a contact number available for                            emergencies. The signs and symptoms of potential                            delayed complications were discussed with the                            patient. Return to normal activities tomorrow.                            Written discharge instructions were provided to the                            patient.                           - Resume previous diet.                           - Continue present medications. Can reduce                            pantoprazole to 40 mg once daily before breakfast                            as tolerated. May require twice daily dosing when                            using  NSAIDs. Jerene Bears, MD 07/14/2016 10:30:11 AM This report has been signed electronically.

## 2016-07-15 ENCOUNTER — Telehealth: Payer: Self-pay

## 2016-07-15 ENCOUNTER — Telehealth: Payer: Self-pay | Admitting: *Deleted

## 2016-07-15 NOTE — Telephone Encounter (Signed)
  Follow up Call-  Call back number 07/14/2016 05/12/2016  Post procedure Call Back phone  # [phone]  Permission to leave phone message Yes Yes  Some recent data might be hidden    Patient was called for follow up after her procedure on 07/14/2016. No answer at the number given for follow up phone call. A message was left on the answering machine. This was the second attempt to contact the patient.

## 2016-07-15 NOTE — Telephone Encounter (Signed)
  Follow up Call-  Call back number 07/14/2016 05/12/2016  Post procedure Call Back phone  # [phone]  Permission to leave phone message Yes Yes  Some recent data might be hidden     Patient questions:  Do you have a fever, pain , or abdominal swelling? No. Pain Score  0 *  Have you tolerated food without any problems? Yes.    Have you been able to return to your normal activities? Yes.    Do you have any questions about your discharge instructions: Diet   No. Medications  No. Follow up visit  No.  Do you have questions or concerns about your Care? No.  Actions: * If pain score is 4 or above: No action needed, pain <4.

## 2016-07-15 NOTE — Telephone Encounter (Signed)
Left message on f/u callback 

## 2016-08-03 ENCOUNTER — Other Ambulatory Visit (HOSPITAL_COMMUNITY): Payer: Self-pay | Admitting: Obstetrics

## 2016-08-03 DIAGNOSIS — Z1231 Encounter for screening mammogram for malignant neoplasm of breast: Secondary | ICD-10-CM

## 2016-08-10 ENCOUNTER — Encounter: Payer: BLUE CROSS/BLUE SHIELD | Admitting: Internal Medicine

## 2016-08-10 ENCOUNTER — Other Ambulatory Visit: Payer: Self-pay | Admitting: Neurosurgery

## 2016-08-10 DIAGNOSIS — M542 Cervicalgia: Secondary | ICD-10-CM

## 2016-08-26 ENCOUNTER — Other Ambulatory Visit: Payer: BLUE CROSS/BLUE SHIELD

## 2016-08-29 ENCOUNTER — Ambulatory Visit (HOSPITAL_COMMUNITY)
Admission: RE | Admit: 2016-08-29 | Discharge: 2016-08-29 | Disposition: A | Discharge status: Home / Self Care | Payer: BLUE CROSS/BLUE SHIELD | Source: Ambulatory Visit | Attending: Obstetrics | Admitting: Obstetrics

## 2016-08-29 DIAGNOSIS — Z1231 Encounter for screening mammogram for malignant neoplasm of breast: Secondary | ICD-10-CM | POA: Insufficient documentation

## 2016-09-06 ENCOUNTER — Ambulatory Visit
Admission: RE | Admit: 2016-09-06 | Discharge: 2016-09-06 | Disposition: A | Discharge status: Home / Self Care | Payer: BLUE CROSS/BLUE SHIELD | Source: Ambulatory Visit | Attending: Neurosurgery | Admitting: Neurosurgery

## 2016-09-06 DIAGNOSIS — M542 Cervicalgia: Secondary | ICD-10-CM

## 2016-09-06 MED ORDER — IOPAMIDOL (ISOVUE-M 300) INJECTION 61%
1.0000 mL | Freq: Once | INTRAMUSCULAR | Status: AC | PRN
  Administered 2016-09-06: 1 mL via INTRA_ARTICULAR

## 2016-09-06 MED ORDER — DEXAMETHASONE SODIUM PHOSPHATE 4 MG/ML IJ SOLN: 5.0000 mg | Freq: Once | INTRAMUSCULAR | Status: DC

## 2016-09-06 NOTE — Discharge Instructions (Signed)

## 2016-09-14 ENCOUNTER — Other Ambulatory Visit: Payer: Self-pay | Admitting: Sports Medicine

## 2016-09-14 DIAGNOSIS — M1611 Unilateral primary osteoarthritis, right hip: Secondary | ICD-10-CM

## 2016-09-21 ENCOUNTER — Ambulatory Visit
Admission: RE | Admit: 2016-09-21 | Discharge: 2016-09-21 | Disposition: A | Discharge status: Home / Self Care | Payer: BLUE CROSS/BLUE SHIELD | Source: Ambulatory Visit | Attending: Sports Medicine | Admitting: Sports Medicine

## 2016-09-21 DIAGNOSIS — M1611 Unilateral primary osteoarthritis, right hip: Secondary | ICD-10-CM

## 2016-09-21 MED ORDER — METHYLPREDNISOLONE ACETATE 40 MG/ML INJ SUSP (RADIOLOG
120.0000 mg | Freq: Once | INTRAMUSCULAR | Status: AC
  Administered 2016-09-21: 120 mg via INTRA_ARTICULAR

## 2016-09-21 MED ORDER — IOPAMIDOL (ISOVUE-M 200) INJECTION 41%
1.0000 mL | Freq: Once | INTRAMUSCULAR | Status: AC
  Administered 2016-09-21: 1 mL via INTRA_ARTICULAR

## 2016-09-23 LAB — TSH: TSH: 0.36 u[IU]/mL — AB (ref <=5.90)

## 2016-10-06 LAB — TSH: TSH: 0.97 u[IU]/mL (ref <=5.90)

## 2016-10-10 HISTORY — PX: KNEE SURGERY: SHX244

## 2016-11-25 ENCOUNTER — Ambulatory Visit (INDEPENDENT_AMBULATORY_CARE_PROVIDER_SITE_OTHER): Payer: Commercial Managed Care - PPO | Admitting: Physician Assistant

## 2016-11-25 VITALS — BP 116/82 | HR 83 | Temp 98.5°F | Resp 16 | Ht 64.0 in | Wt 172.0 lb

## 2016-11-25 DIAGNOSIS — J069 Acute upper respiratory infection, unspecified: Secondary | ICD-10-CM | POA: Diagnosis not present

## 2016-11-25 MED ORDER — AMOXICILLIN 500 MG PO CAPS: 500.0000 mg | ORAL_CAPSULE | Freq: Three times a day (TID) | ORAL | 0 refills | Status: AC

## 2016-11-25 NOTE — Progress Notes (Signed)
[facility] [address] [place] [address] [phone]  Date:  11/25/2016   Name:  Kimberly King   DOB:  06-Jun-1951   MRN:  [id number]  PCP:  [name], MD    History of Present Illness: Chief Complaint  Patient presents with  . Ear Pain    Bilateral x 1 week   . Nasal Congestion  . Sore Throat    Kimberly King is a 66 y.o. female patient who presents to [facility] for cc of ear pain, congestion, and sore throat.  Ear pian, nasal congestion, and sore throat.  Raw and tight.  There is hoarseness.  She is not coughing.  She has runny nose.  No fevers.  She has body aches and chills.  She has sick contacts.   Sinus and allergy, benadryl, and acetaminophen for her symtpoms which helped. Patient Active Problem List   Diagnosis Date Noted  . Elevated WBC count 10/27/2015  . CLL (chronic lymphocytic leukemia) ([place]) 12/17/2013  . Chronic lymphocytic leukemia ([place]) 05/16/2013  . DERMATOPHYTOSIS OF NAIL 12/31/2007  . INSOMNIA UNSPECIFIED 12/31/2007  . COLONIC POLYPS, ADENOMATOUS 10/26/2007  . HEMORRHOIDS, INTERNAL 10/26/2007  . NECK PAIN 03/07/2007  . BACK PAIN, LUMBAR 03/07/2007    Past Medical History:  Diagnosis Date  . Allergy   . Anxiety   . Arthritis    knees hips neck  . Cancer [facility])    leukemia dx 06/2013 - no current treatment - being monitored  . Cataract    bilateral - being monitored, no treatment currently  . Depression   . GERD (gastroesophageal reflux disease)   . Hypertension   . Leukocytosis    and low blood sodium   . Migraines   . Smoker     Past Surgical History:  Procedure Laterality Date  . APPENDECTOMY    . BLADDER SURGERY     tack  . CERVICAL FUSION     x2  -  C5-C6  . COLONOSCOPY  2014   pyrtle - hx polyps  . DILATION AND CURETTAGE OF UTERUS     x 2  . TONSILLECTOMY      Social History  Substance Use Topics  . Smoking status: Former Smoker    Packs/day: 0.25    Years: 30.00    Types: Cigarettes    Quit  date: 04/22/2015  . Smokeless tobacco: Never Used  . Alcohol use Yes     Comment: Occasional    Family History  Problem Relation Age of Onset  . Colon cancer Paternal Grandmother 78  . Esophageal cancer Paternal Uncle   . Heart disease Father   . Lung cancer Father   . Lung cancer Maternal Grandfather   . Breast cancer Maternal Grandmother   . Rectal cancer Neg Hx   . Stomach cancer Neg Hx     No Known Allergies  Medication list has been reviewed and updated.  Current Outpatient Prescriptions on File Prior to Visit  Medication Sig Dispense Refill  . ALPRAZolam (XANAX) 0.25 MG tablet Take 0.25 mg by mouth as needed for anxiety.    . cetirizine (ZYRTEC) 10 MG tablet Take 10 mg by mouth daily as needed for allergies.    Marland Kitchen diclofenac (VOLTAREN) 75 MG EC tablet Take 75 mg by mouth 2 (two) times daily.  3  . diphenhydrAMINE (BENADRYL) 25 MG tablet Take 25 mg by mouth at bedtime as needed (for insomnia). Reported on 04/29/2016    .  FLUTICASONE PROPIONATE, NASAL, NA Place 2 sprays into the nose daily.    Marland Kitchen lidocaine (LIDODERM) 5 %     . lisinopril-hydrochlorothiazide (PRINZIDE,ZESTORETIC) 10-12.5 MG per tablet Take 1 tablet by mouth every three (3) days as needed. Reported on 12/24/2015    . pantoprazole (PROTONIX) 40 MG tablet Take 1 tablet (40 mg total) by mouth 2 (two) times daily before a meal. 60 tablet 3  . SUMAtriptan (IMITREX) 50 MG tablet Take 50 mg by mouth every 2 (two) hours as needed for migraine. Reported on 04/29/2016    . traMADol (ULTRAM) 50 MG tablet Take by mouth every 6 (six) hours as needed. Reported on 12/24/2015    . Calcium Carb-Ergocalciferol 250-125 MG-UNIT TABS Take by mouth.    . Calcium Carbonate-Vitamin D (CALCIUM + D PO) Take 2 tablets by mouth daily.    Marland Kitchen lidocaine in dextrose 5-7.5 % injection by Intrathecal route.    . RESTASIS 0.05 % ophthalmic emulsion   4   Current Facility-Administered Medications on File Prior to Visit  Medication Dose Route Frequency  Provider Last Rate Last Dose  . 0.9 %  sodium chloride infusion  500 mL Intravenous Continuous Lajuan Lines Pyrtle, MD      . 0.9 %  sodium chloride infusion  500 mL Intravenous Continuous Jerene Bears, MD        ROS ROS otherwise unremarkable unless listed above.   Physical Examination: BP 116/82   Pulse 83   Temp 98.5 F (36.9 C) (Oral)   Resp 16   Ht 5\' 4"  (1.626 m)   Wt 172 lb (78 kg)   SpO2 97%   BMI 29.52 kg/m  Ideal Body Weight: Weight in (lb) to have BMI = 25: 145.3  Physical Exam  Constitutional: She is oriented to person, place, and time. She appears well-developed and well-nourished. No distress.  HENT:  Head: Normocephalic and atraumatic.  Right Ear: Tympanic membrane, external ear and ear canal normal.  Left Ear: Tympanic membrane, external ear and ear canal normal.  Nose: Mucosal edema and rhinorrhea present. Right sinus exhibits no maxillary sinus tenderness and no frontal sinus tenderness. Left sinus exhibits no maxillary sinus tenderness and no frontal sinus tenderness.  Mouth/Throat: No uvula swelling. No oropharyngeal exudate, posterior oropharyngeal edema or posterior oropharyngeal erythema.  Eyes: Conjunctivae and EOM are normal. Pupils are equal, round, and reactive to light.  Cardiovascular: Normal rate and regular rhythm.  Exam reveals no gallop, no distant heart sounds and no friction rub.   No murmur heard. Pulmonary/Chest: Effort normal. No respiratory distress. She has no decreased breath sounds. She has no wheezes. She has no rhonchi.  Lymphadenopathy:       Head (right side): No submandibular, no tonsillar, no preauricular and no posterior auricular adenopathy present.       Head (left side): No submandibular, no tonsillar, no preauricular and no posterior auricular adenopathy present.  Neurological: She is alert and oriented to person, place, and time.  Skin: She is not diaphoretic.  Psychiatric: She has a normal mood and affect. Her behavior is normal.      Assessment and Plan: Kimberly King is a 66 y.o. female who is here today for cc of ear pain, sore throat, and congestion. Given health hx, printing amoxicillin.  If no improvement within 72 hours, she will take medication to completion. rtc as needed. Upper respiratory tract infection, unspecified type - Plan: amoxicillin (AMOXIL) 500 MG capsule  Ivar Drape, PA-C [facility] 2/17/201810:23 AM

## 2016-11-25 NOTE — Patient Instructions (Addendum)
   Upper Respiratory Infection, Adult Most upper respiratory infections (URIs) are caused by a virus. A URI affects the nose, throat, and upper air passages. The most common type of URI is often called "the common cold." Follow these instructions at home:  Take medicines only as told by your doctor.  Gargle warm saltwater or take cough drops to comfort your throat as told by your doctor.  Use a warm mist humidifier or inhale steam from a shower to increase air moisture. This may make it easier to breathe.  Drink enough fluid to keep your pee (urine) clear or pale yellow.  Eat soups and other clear broths.  Have a healthy diet.  Rest as needed.  Go back to work when your fever is gone or your doctor says it is okay.  You may need to stay home longer to avoid giving your URI to others.  You can also wear a face mask and wash your hands often to prevent spread of the virus.  Use your inhaler more if you have asthma.  Do not use any tobacco products, including cigarettes, chewing tobacco, or electronic cigarettes. If you need help quitting, ask your doctor. Contact a doctor if:  You are getting worse, not better.  Your symptoms are not helped by medicine.  You have chills.  You are getting more short of breath.  You have brown or red mucus.  You have yellow or brown discharge from your nose.  You have pain in your face, especially when you bend forward.  You have a fever.  You have puffy (swollen) neck glands.  You have pain while swallowing.  You have white areas in the back of your throat. Get help right away if:  You have very bad or constant:  Headache.  Ear pain.  Pain in your forehead, behind your eyes, and over your cheekbones (sinus pain).  Chest pain.  You have long-lasting (chronic) lung disease and any of the following:  Wheezing.  Long-lasting cough.  Coughing up blood.  A change in your usual mucus.  You have a stiff neck.  You have  changes in your:  Vision.  Hearing.  Thinking.  Mood. This information is not intended to replace advice given to you by your health care provider. Make sure you discuss any questions you have with your health care provider. Document Released: 03/14/2008 Document Revised: 05/29/2016 Document Reviewed: 01/01/2014 Elsevier Interactive Patient Education  2017 [employer]     IF you received an x-ray today, you will receive an invoice from [facility]. Please contact [facility] at [phone] with questions or concerns regarding your invoice.   IF you received labwork today, you will receive an invoice from [facility]. Please contact [facility] at [phone] with questions or concerns regarding your invoice.   Our billing staff will not be able to assist you with questions regarding bills from these companies.  You will be contacted with the lab results as soon as they are available. The fastest way to get your results is to activate your My Chart account. Instructions are located on the last page of this paperwork. If you have not heard from us regarding the results in 2 weeks, please contact this office.      

## 2016-12-06 ENCOUNTER — Telehealth: Payer: Self-pay | Admitting: Internal Medicine

## 2016-12-06 ENCOUNTER — Encounter: Payer: Self-pay | Admitting: "Endocrinology

## 2016-12-06 ENCOUNTER — Ambulatory Visit (INDEPENDENT_AMBULATORY_CARE_PROVIDER_SITE_OTHER): Payer: Commercial Managed Care - PPO | Admitting: "Endocrinology

## 2016-12-06 VITALS — BP 142/89 | HR 72 | Ht 64.0 in | Wt 174.0 lb

## 2016-12-06 DIAGNOSIS — E0590 Thyrotoxicosis, unspecified without thyrotoxic crisis or storm: Secondary | ICD-10-CM | POA: Diagnosis not present

## 2016-12-06 NOTE — Telephone Encounter (Signed)
Spoke with patient to confirm that her appointments have been change and she stated that she received them on MyChart

## 2016-12-06 NOTE — Progress Notes (Signed)
Subjective:    Patient ID: Kimberly King, female    DOB: [birth year], PCP [name], MD   Past Medical History:  Diagnosis Date  . Allergy   . Anxiety   . Arthritis    knees hips neck  . Cancer [facility])    leukemia dx 06/2013 - no current treatment - being monitored  . Cataract    bilateral - being monitored, no treatment currently  . Depression   . GERD (gastroesophageal reflux disease)   . Hypertension   . Leukocytosis    and low blood sodium   . Migraines   . Smoker    Past Surgical History:  Procedure Laterality Date  . APPENDECTOMY    . BLADDER SURGERY     tack  . CERVICAL FUSION     x2  -  C5-C6  . COLONOSCOPY  2014   pyrtle - hx polyps  . DILATION AND CURETTAGE OF UTERUS     x 2  . TONSILLECTOMY     Social History   Social History  . Marital status: Divorced    Spouse name: N/A  . Number of children: N/A  . Years of education: N/A   Social History Main Topics  . Smoking status: Former Smoker    Packs/day: 0.25    Years: 30.00    Types: Cigarettes    Quit date: 04/22/2015  . Smokeless tobacco: Never Used  . Alcohol use Yes     Comment: Occasional  . Drug use: No  . Sexual activity: Not Asked   Other Topics Concern  . None   Social History Narrative  . None   Outpatient Encounter Prescriptions as of 12/06/2016  Medication Sig  . ALPRAZolam (XANAX) 0.25 MG tablet Take 0.25 mg by mouth at bedtime as needed for anxiety.  . Calcium Carbonate-Vit D-Min (CALCIUM 1200 PO) Take by mouth.  . cetirizine (ZYRTEC) 10 MG tablet Take 10 mg by mouth daily as needed for allergies.  Marland Kitchen diclofenac (VOLTAREN) 75 MG EC tablet Take 75 mg by mouth 2 (two) times daily.  Marland Kitchen lidocaine (LIDODERM) 5 %   . lisinopril-hydrochlorothiazide (PRINZIDE,ZESTORETIC) 10-12.5 MG per tablet Take 1 tablet by mouth every three (3) days as needed. Reported on 12/24/2015  . pantoprazole (PROTONIX) 40 MG tablet Take 1 tablet (40 mg total) by mouth 2 (two) times daily before a meal.  .  RESTASIS 0.05 % ophthalmic emulsion   . SUMAtriptan (IMITREX) 50 MG tablet Take 50 mg by mouth every 2 (two) hours as needed for migraine. Reported on 04/29/2016  . traMADol (ULTRAM) 50 MG tablet Take by mouth every 6 (six) hours as needed. Reported on 12/24/2015  . [DISCONTINUED] ALPRAZolam (XANAX) 0.25 MG tablet Take 0.25 mg by mouth as needed for anxiety.  . [DISCONTINUED] Calcium Carb-Ergocalciferol 250-125 MG-UNIT TABS Take by mouth.  . [DISCONTINUED] Calcium Carbonate-Vitamin D (CALCIUM + D PO) Take 2 tablets by mouth daily.  . [DISCONTINUED] diphenhydrAMINE (BENADRYL) 25 MG tablet Take 25 mg by mouth at bedtime as needed (for insomnia). Reported on 04/29/2016  . [DISCONTINUED] FLUTICASONE PROPIONATE, NASAL, NA Place 2 sprays into the nose daily.  . [DISCONTINUED] lidocaine in dextrose 5-7.5 % injection by Intrathecal route.  . [DISCONTINUED] 0.9 %  sodium chloride infusion   . [DISCONTINUED] 0.9 %  sodium chloride infusion    No facility-administered encounter medications on file as of 12/06/2016.    ALLERGIES: No Known Allergies  VACCINATION STATUS: Immunization History  Administered Date(s) Administered  .  Influenza,inj,Quad PF,36+ Mos 06/19/2013, 07/12/2014, 08/09/2015, 06/23/2016  . Pneumococcal Polysaccharide-23 06/19/2013  . Td 12/31/2007    HPI 66 year old female patient with medical history as above. She is being seen in consultation for subclinical hyperthyroidism requested by Dr. Woody Seller. - On 09/23/2016 routine labs showed slightly suppressed TSH of 0.36. Repeat labs on September 26, 2016 was more complete studies where within normal limits. Patient denies any prior history of thyroid dysfunction. Patient denies family history of thyroid dysfunction nor thyroid cancer. - She denies any recent weight gain, cold intolerance, heat intolerance, palpitations, tremors. - Patient denies any exposure to neck radiation. She is currently not on any antithyroid medications nor thyroid  hormone supplements. -She complains of on and off hot flashes, depression, and mild fatigue.  Review of Systems  Constitutional: no weight gain/loss, no fatigue, no subjective hyperthermia, no subjective hypothermia Eyes: no blurry vision, no xerophthalmia ENT: no sore throat, no nodules palpated in throat, no dysphagia/odynophagia, no hoarseness Cardiovascular: no Chest Pain, no Shortness of Breath, no palpitations, no leg swelling Respiratory: no cough, no SOB Gastrointestinal: no Nausea/Vomiting/Diarhhea Musculoskeletal: no muscle/joint aches Skin: no rashes Neurological: no tremors, no numbness, no tingling, no dizziness Psychiatric: +depression, no anxiety  Objective:    BP (!) 142/89   Pulse 72   Ht 5\' 4"  (1.626 m)   Wt 174 lb (78.9 kg)   BMI 29.87 kg/m   Wt Readings from Last 3 Encounters:  12/06/16 174 lb (78.9 kg)  11/25/16 172 lb (78 kg)  07/14/16 176 lb (79.8 kg)    Physical Exam  Constitutional: Moderately over weight for hight, not in acute distress, normal state of mind Eyes: PERRLA, EOMI, no exophthalmos ENT: moist mucous membranes, no thyromegaly, no cervical lymphadenopathy Cardiovascular: normal precordial activity, Regular Rate and Rhythm, no Murmur/Rubs/Gallops Respiratory:  adequate breathing efforts, no gross chest deformity, Clear to auscultation bilaterally Gastrointestinal: abdomen soft, Non -tender, No distension, Bowel Sounds present Musculoskeletal: no gross deformities, strength intact in all four extremities Skin: moist, warm, no rashes Neurological: no tremor with outstretched hands, Deep tendon reflexes normal in all four extremities.  CMP ( most recent) CMP     Component Value Date/Time   NA 137 06/28/2016 1324   K 3.6 06/28/2016 1324   CO2 28 06/28/2016 1324   GLUCOSE 100 06/28/2016 1324   BUN 14.0 06/28/2016 1324   CREATININE 0.9 06/28/2016 1324   CALCIUM 9.7 06/28/2016 1324   PROT 7.1 06/28/2016 1324   ALBUMIN 4.0 06/28/2016  1324   AST 25 06/28/2016 1324   ALT 37 06/28/2016 1324   ALKPHOS 101 06/28/2016 1324   BILITOT 0.31 06/28/2016 1324    Recent Results (from the past 2160 hour(s))  TSH     Status: Abnormal   Collection Time: 09/23/16 12:00 AM  Result Value Ref Range   TSH 0.36 (A) 0.41 - 5.90 uIU/mL  TSH     Status: None   Collection Time: 10/06/16 12:00 AM  Result Value Ref Range   TSH 0.97 0.41 - 5.90 uIU/mL    Comment:  total t3 80, free t4 1.24,      Assessment & Plan:   1. Subclinical hyperthyroidism - I have reviewed her available thyroid function test results. Based on her presenting referral package the subclinical hyperthyroidism seems to have resolved on her repeat more complete thyroid function test on 10/06/2016. She does not have clinical signs or symptoms suggestive of hyperthyroidism at this time. She does not have clinical goiter to require any thyroid  imaging. - She will not need any antithyroid intervention at this time. She will need repeat of TSH and free T4 in 6 month. She will return to clinic when necessary.  - I advised patient to maintain close follow up with [name], MD for primary care needs. Follow up plan: Return in about 1 year (around 12/06/2017), or if symptoms worsen or fail to improve.  Glade Lloyd, MD Phone: [phone]  Fax: [phone]   12/06/2016, 11:42 AM

## 2016-12-26 ENCOUNTER — Other Ambulatory Visit: Payer: BLUE CROSS/BLUE SHIELD

## 2016-12-26 ENCOUNTER — Ambulatory Visit: Payer: BLUE CROSS/BLUE SHIELD | Admitting: Internal Medicine

## 2016-12-27 ENCOUNTER — Ambulatory Visit (HOSPITAL_BASED_OUTPATIENT_CLINIC_OR_DEPARTMENT_OTHER): Payer: Commercial Managed Care - PPO | Admitting: Internal Medicine

## 2016-12-27 ENCOUNTER — Other Ambulatory Visit (HOSPITAL_BASED_OUTPATIENT_CLINIC_OR_DEPARTMENT_OTHER): Payer: Commercial Managed Care - PPO

## 2016-12-27 ENCOUNTER — Encounter: Payer: Self-pay | Admitting: Internal Medicine

## 2016-12-27 ENCOUNTER — Telehealth: Payer: Self-pay | Admitting: Internal Medicine

## 2016-12-27 VITALS — BP 120/90 | HR 78 | Temp 98.0°F | Resp 18 | Ht 64.0 in | Wt 172.7 lb

## 2016-12-27 DIAGNOSIS — C9110 Chronic lymphocytic leukemia of B-cell type not having achieved remission: Secondary | ICD-10-CM | POA: Diagnosis not present

## 2016-12-27 LAB — CBC WITH DIFFERENTIAL/PLATELET
BASO%: 0.3 % (ref 0.0–2.0)
BASOS ABS: 0.1 10*3/uL (ref 0.0–0.1)
EOS%: 1.3 % (ref 0.0–7.0)
Eosinophils Absolute: 0.3 10*3/uL (ref 0.0–0.5)
HCT: 36.2 % (ref 34.8–46.6)
HGB: 11.6 g/dL (ref 11.6–15.9)
LYMPH%: 58 % — AB (ref 14.0–49.7)
MCH: 28.4 pg (ref 25.1–34.0)
MCHC: 32 g/dL (ref 31.5–36.0)
MCV: 88.7 fL (ref 79.5–101.0)
MONO#: 1 10*3/uL — ABNORMAL HIGH (ref 0.1–0.9)
MONO%: 5 % (ref 0.0–14.0)
NEUT#: 7 10*3/uL — ABNORMAL HIGH (ref 1.5–6.5)
NEUT%: 35.4 % — AB (ref 38.4–76.8)
Platelets: 344 10*3/uL (ref 145–400)
RBC: 4.08 10*6/uL (ref 3.70–5.45)
RDW: 14.7 % — ABNORMAL HIGH (ref 11.2–14.5)
WBC: 19.7 10*3/uL — ABNORMAL HIGH (ref 3.9–10.3)
lymph#: 11.4 10*3/uL — ABNORMAL HIGH (ref 0.9–3.3)

## 2016-12-27 LAB — LACTATE DEHYDROGENASE: LDH: 191 U/L (ref 125–245)

## 2016-12-27 LAB — TECHNOLOGIST REVIEW

## 2016-12-27 NOTE — Progress Notes (Signed)
[place] [phone]   [phone]  OFFICE PROGRESS NOTE  [name], MD [place] [address]  DIAGNOSIS: Stage 0 chronic lymphocytic leukemia  PRIOR THERAPY: None  CURRENT THERAPY: Observation.  INTERVAL HISTORY: Kimberly King 66 y.o. female came to the clinic today for six-month follow-up visit. The patient is feeling fine today with no specific complaints. She denied having any weight loss or night sweats. She has no chest pain, shortness of breath, cough or hemoptysis. She has no fever or chills. She denied having any nausea, vomiting, diarrhea or constipation. She is here today for evaluation and repeat blood work. She is expected to retire in few weeks.   MEDICAL HISTORY: Past Medical History:  Diagnosis Date  . Allergy   . Anxiety   . Arthritis    knees hips neck  . Cancer [facility])    leukemia dx 06/2013 - no current treatment - being monitored  . Cataract    bilateral - being monitored, no treatment currently  . Depression   . GERD (gastroesophageal reflux disease)   . Hypertension   . Leukocytosis    and low blood sodium   . Migraines   . Smoker     ALLERGIES:  has No Known Allergies.  MEDICATIONS:  Current Outpatient Prescriptions  Medication Sig Dispense Refill  . ALPRAZolam (XANAX) 0.25 MG tablet Take 0.25 mg by mouth at bedtime as needed for anxiety.    . Calcium Carbonate-Vit D-Min (CALCIUM 1200 PO) Take by mouth.    . cetirizine (ZYRTEC) 10 MG tablet Take 10 mg by mouth daily as needed for allergies.    Marland Kitchen diclofenac (VOLTAREN) 75 MG EC tablet Take 75 mg by mouth 2 (two) times daily.  3  . lidocaine (LIDODERM) 5 %     . lisinopril-hydrochlorothiazide (PRINZIDE,ZESTORETIC) 10-12.5 MG per tablet Take 1 tablet by mouth every three (3) days as needed. Reported on 12/24/2015    . pantoprazole (PROTONIX) 40 MG tablet Take 1 tablet (40 mg total) by mouth 2 (two) times daily before a meal. 60 tablet 3  .  RESTASIS 0.05 % ophthalmic emulsion   4  . SUMAtriptan (IMITREX) 50 MG tablet Take 50 mg by mouth every 2 (two) hours as needed for migraine. Reported on 04/29/2016    . traMADol (ULTRAM) 50 MG tablet Take by mouth every 6 (six) hours as needed. Reported on 12/24/2015     No current facility-administered medications for this visit.     SURGICAL HISTORY:  Past Surgical History:  Procedure Laterality Date  . APPENDECTOMY    . BLADDER SURGERY     tack  . CERVICAL FUSION     x2  -  C5-C6  . COLONOSCOPY  2014   pyrtle - hx polyps  . DILATION AND CURETTAGE OF UTERUS     x 2  . TONSILLECTOMY      REVIEW OF SYSTEMS:  A comprehensive review of systems was negative.   PHYSICAL EXAMINATION: General appearance: alert, cooperative and no distress Head: Normocephalic, without obvious abnormality, atraumatic Neck: no adenopathy, no JVD, supple, symmetrical, trachea midline and thyroid not enlarged, symmetric, no tenderness/mass/nodules Lymph nodes: Cervical, supraclavicular, and axillary nodes normal. Resp: clear to auscultation bilaterally Back: symmetric, no curvature. ROM normal. No CVA tenderness. Cardio: regular rate and rhythm, S1, S2 normal, no murmur, click, rub or gallop GI: soft, non-tender; bowel sounds normal; no masses,  no organomegaly Extremities: extremities normal, atraumatic, no  cyanosis or edema  ECOG PERFORMANCE STATUS: 0 - Asymptomatic  Blood pressure 120/90, pulse 78, temperature 98 F (36.7 C), temperature source Oral, resp. rate 18, height 5\' 4"  (1.626 m), weight 172 lb 11.2 oz (78.3 kg), SpO2 100 %.  LABORATORY DATA: Lab Results  Component Value Date   WBC 19.7 (H) 12/27/2016   HGB 11.6 12/27/2016   HCT 36.2 12/27/2016   MCV 88.7 12/27/2016   PLT 344 12/27/2016      Chemistry      Component Value Date/Time   NA 137 06/28/2016 1324   K 3.6 06/28/2016 1324   CO2 28 06/28/2016 1324   BUN 14.0 06/28/2016 1324   CREATININE 0.9 06/28/2016 1324        Component Value Date/Time   CALCIUM 9.7 06/28/2016 1324   ALKPHOS 101 06/28/2016 1324   AST 25 06/28/2016 1324   ALT 37 06/28/2016 1324   BILITOT 0.31 06/28/2016 1324       RADIOGRAPHIC STUDIES: No results found.  ASSESSMENT AND PLAN:  This is a very pleasant 66 years old white female with a stage 0 chronic lymphocytic leukemia and currently on observation. She is doing fine with no complaints. Her recent CBC showed stable disease with very mild increase in her total white blood count compared to 6 months ago. I discussed the lab result with the patient today. I recommended for her to continue on observation with repeat CBC, comprehensive metabolic panel and LDH in 6 months. She was advised to call immediately if she has any concerning symptoms in the interval The patient voices understanding of current disease status and treatment options and is in agreement with the current care plan.  All questions were answered. The patient knows to call the clinic with any problems, questions or concerns. We can certainly see the patient much sooner if necessary. I spent 10 minutes counseling the patient face to face. The total time spent in the appointment was 15 minutes.  Disclaimer: This note was dictated with voice recognition software. Similar sounding words can inadvertently be transcribed and may not be corrected upon review.

## 2016-12-27 NOTE — Telephone Encounter (Signed)
Appointments scheduled per 3.20.18 LOS. Patient opted to not have printouts.

## 2016-12-28 LAB — HM PAP SMEAR: HM Pap smear: NORMAL

## 2017-04-17 DIAGNOSIS — M47816 Spondylosis without myelopathy or radiculopathy, lumbar region: Secondary | ICD-10-CM | POA: Diagnosis not present

## 2017-04-17 DIAGNOSIS — M9902 Segmental and somatic dysfunction of thoracic region: Secondary | ICD-10-CM | POA: Diagnosis not present

## 2017-04-17 DIAGNOSIS — M9903 Segmental and somatic dysfunction of lumbar region: Secondary | ICD-10-CM | POA: Diagnosis not present

## 2017-04-17 DIAGNOSIS — M9901 Segmental and somatic dysfunction of cervical region: Secondary | ICD-10-CM | POA: Diagnosis not present

## 2017-04-17 DIAGNOSIS — M546 Pain in thoracic spine: Secondary | ICD-10-CM | POA: Diagnosis not present

## 2017-04-17 DIAGNOSIS — M47812 Spondylosis without myelopathy or radiculopathy, cervical region: Secondary | ICD-10-CM | POA: Diagnosis not present

## 2017-04-18 DIAGNOSIS — H40013 Open angle with borderline findings, low risk, bilateral: Secondary | ICD-10-CM | POA: Diagnosis not present

## 2017-04-18 DIAGNOSIS — H04123 Dry eye syndrome of bilateral lacrimal glands: Secondary | ICD-10-CM | POA: Diagnosis not present

## 2017-04-20 DIAGNOSIS — M1611 Unilateral primary osteoarthritis, right hip: Secondary | ICD-10-CM | POA: Diagnosis not present

## 2017-04-20 DIAGNOSIS — M25552 Pain in left hip: Secondary | ICD-10-CM | POA: Diagnosis not present

## 2017-04-24 DIAGNOSIS — I10 Essential (primary) hypertension: Secondary | ICD-10-CM | POA: Diagnosis not present

## 2017-04-24 DIAGNOSIS — K219 Gastro-esophageal reflux disease without esophagitis: Secondary | ICD-10-CM | POA: Diagnosis not present

## 2017-04-24 DIAGNOSIS — Z299 Encounter for prophylactic measures, unspecified: Secondary | ICD-10-CM | POA: Diagnosis not present

## 2017-04-24 DIAGNOSIS — J310 Chronic rhinitis: Secondary | ICD-10-CM | POA: Diagnosis not present

## 2017-04-24 DIAGNOSIS — E0590 Thyrotoxicosis, unspecified without thyrotoxic crisis or storm: Secondary | ICD-10-CM | POA: Diagnosis not present

## 2017-04-24 DIAGNOSIS — Z6830 Body mass index (BMI) 30.0-30.9, adult: Secondary | ICD-10-CM | POA: Diagnosis not present

## 2017-04-24 DIAGNOSIS — Z713 Dietary counseling and surveillance: Secondary | ICD-10-CM | POA: Diagnosis not present

## 2017-04-24 DIAGNOSIS — R233 Spontaneous ecchymoses: Secondary | ICD-10-CM | POA: Diagnosis not present

## 2017-04-27 DIAGNOSIS — M5136 Other intervertebral disc degeneration, lumbar region: Secondary | ICD-10-CM | POA: Diagnosis not present

## 2017-04-27 DIAGNOSIS — I10 Essential (primary) hypertension: Secondary | ICD-10-CM | POA: Diagnosis not present

## 2017-04-27 DIAGNOSIS — M549 Dorsalgia, unspecified: Secondary | ICD-10-CM | POA: Diagnosis not present

## 2017-04-27 DIAGNOSIS — Z6831 Body mass index (BMI) 31.0-31.9, adult: Secondary | ICD-10-CM | POA: Diagnosis not present

## 2017-04-27 DIAGNOSIS — M5030 Other cervical disc degeneration, unspecified cervical region: Secondary | ICD-10-CM | POA: Diagnosis not present

## 2017-04-27 DIAGNOSIS — M5020 Other cervical disc displacement, unspecified cervical region: Secondary | ICD-10-CM | POA: Diagnosis not present

## 2017-04-27 DIAGNOSIS — M47816 Spondylosis without myelopathy or radiculopathy, lumbar region: Secondary | ICD-10-CM | POA: Diagnosis not present

## 2017-04-27 DIAGNOSIS — M4722 Other spondylosis with radiculopathy, cervical region: Secondary | ICD-10-CM | POA: Diagnosis not present

## 2017-04-27 DIAGNOSIS — M546 Pain in thoracic spine: Secondary | ICD-10-CM | POA: Diagnosis not present

## 2017-05-01 ENCOUNTER — Telehealth: Payer: Self-pay

## 2017-05-01 NOTE — Telephone Encounter (Signed)
Pt called that she has had red and purple spots on both arms for last 3 weeks. They are not painful, they come and go. Maybe 6 or 7 at a time. Pinhead size.  No known injuries. No strenuous activities.  She had an appt with [facility], Arsenio Katz NP for her thyroid and her platelets was 399, wbc was 20.something. lvm at PCP requesting labs be faxed to Dr Worthy Flank pod. Pt was wanting to know if she needs to be concerned.  Her next appt is 9/18.

## 2017-05-01 NOTE — Telephone Encounter (Signed)
Received to Dr Magrinat's nurse ( [facility] bc 3)- forwarded to attending MD - Dr Earlie Server ( [facility] pod 3 )

## 2017-05-03 ENCOUNTER — Telehealth: Payer: Self-pay | Admitting: Medical Oncology

## 2017-05-03 NOTE — Telephone Encounter (Signed)
Per Dr Julien Nordmann I told pt there is nothing concerning on her recent labs and to keep appt in sept.

## 2017-05-15 DIAGNOSIS — M546 Pain in thoracic spine: Secondary | ICD-10-CM | POA: Diagnosis not present

## 2017-05-15 DIAGNOSIS — M9902 Segmental and somatic dysfunction of thoracic region: Secondary | ICD-10-CM | POA: Diagnosis not present

## 2017-05-15 DIAGNOSIS — M47812 Spondylosis without myelopathy or radiculopathy, cervical region: Secondary | ICD-10-CM | POA: Diagnosis not present

## 2017-05-15 DIAGNOSIS — M47816 Spondylosis without myelopathy or radiculopathy, lumbar region: Secondary | ICD-10-CM | POA: Diagnosis not present

## 2017-05-15 DIAGNOSIS — M9901 Segmental and somatic dysfunction of cervical region: Secondary | ICD-10-CM | POA: Diagnosis not present

## 2017-05-15 DIAGNOSIS — M9903 Segmental and somatic dysfunction of lumbar region: Secondary | ICD-10-CM | POA: Diagnosis not present

## 2017-05-18 DIAGNOSIS — M1611 Unilateral primary osteoarthritis, right hip: Secondary | ICD-10-CM | POA: Diagnosis not present

## 2017-06-10 ENCOUNTER — Ambulatory Visit (INDEPENDENT_AMBULATORY_CARE_PROVIDER_SITE_OTHER): Payer: Medicare Other | Admitting: Emergency Medicine

## 2017-06-10 ENCOUNTER — Encounter: Payer: Self-pay | Admitting: Emergency Medicine

## 2017-06-10 VITALS — BP 134/80 | HR 77 | Temp 98.7°F | Resp 20 | Ht 63.5 in | Wt 177.4 lb

## 2017-06-10 DIAGNOSIS — M7918 Myalgia, other site: Secondary | ICD-10-CM | POA: Insufficient documentation

## 2017-06-10 DIAGNOSIS — Z23 Encounter for immunization: Secondary | ICD-10-CM | POA: Diagnosis not present

## 2017-06-10 DIAGNOSIS — M791 Myalgia: Secondary | ICD-10-CM

## 2017-06-10 DIAGNOSIS — T148XXA Other injury of unspecified body region, initial encounter: Secondary | ICD-10-CM

## 2017-06-10 MED ORDER — CYCLOBENZAPRINE HCL 10 MG PO TABS: 10.0000 mg | ORAL_TABLET | Freq: Three times a day (TID) | ORAL | 0 refills | Status: DC | PRN

## 2017-06-10 NOTE — Progress Notes (Signed)
Kimberly King 66 y.o.   Chief Complaint  Patient presents with  . Pulled muscle    Left Buttocks area/Getting out of car Thursday afternoon    HISTORY OF PRESENT ILLNESS: This is a 66 y.o. female complaining of painful pulled muscle to left buttock area that started 2 days ago.  HPI   Prior to Admission medications   Medication Sig Start Date End Date Taking? Authorizing Provider  Calcium Carbonate-Vit D-Min (CALCIUM 1200 PO) Take by mouth.   Yes [provider]  cetirizine (ZYRTEC) 10 MG tablet Take 10 mg by mouth daily as needed for allergies.   Yes [provider]  diclofenac (VOLTAREN) 75 MG EC tablet Take 75 mg by mouth 2 (two) times daily. 11/22/15  Yes [provider]  lidocaine (LIDODERM) 5 %  04/06/16  Yes [provider]  [employer] Shirley Friar) 5 % SOLN Apply to eye.   Yes [provider]  pantoprazole (PROTONIX) 40 MG tablet Take 1 tablet (40 mg total) by mouth 2 (two) times daily before a meal. 06/22/16  Yes Lemmon, Lavone Nian, PA  SUMAtriptan (IMITREX) 50 MG tablet Take 50 mg by mouth every 2 (two) hours as needed for migraine. Reported on 04/29/2016   Yes [provider]  traMADol (ULTRAM) 50 MG tablet Take by mouth every 6 (six) hours as needed. Reported on 12/24/2015   Yes [provider]    No Known Allergies  Patient Active Problem List   Diagnosis Date Noted  . Subclinical hyperthyroidism 12/06/2016  . Elevated WBC count 10/27/2015  . CLL (chronic lymphocytic leukemia) ([place]) 12/17/2013  . Chronic lymphocytic leukemia ([place]) 05/16/2013  . DERMATOPHYTOSIS OF NAIL 12/31/2007  . INSOMNIA UNSPECIFIED 12/31/2007  . COLONIC POLYPS, ADENOMATOUS 10/26/2007  . HEMORRHOIDS, INTERNAL 10/26/2007  . NECK PAIN 03/07/2007  . BACK PAIN, LUMBAR 03/07/2007    Past Medical History:  Diagnosis Date  . Allergy   . Anxiety   . Arthritis    knees hips neck  . Cancer [facility])    leukemia dx 06/2013 - no current  treatment - being monitored  . Cataract    bilateral - being monitored, no treatment currently  . Depression   . GERD (gastroesophageal reflux disease)   . Hypertension   . Leukocytosis    and low blood sodium   . Migraines   . Smoker     Past Surgical History:  Procedure Laterality Date  . APPENDECTOMY    . BLADDER SURGERY     tack  . CERVICAL FUSION     x2  -  C5-C6  . COLONOSCOPY  2014   pyrtle - hx polyps  . DILATION AND CURETTAGE OF UTERUS     x 2  . TONSILLECTOMY      Social History   Social History  . Marital status: Divorced    Spouse name: N/A  . Number of children: N/A  . Years of education: N/A   Occupational History  . Not on file.   Social History Main Topics  . Smoking status: Former Smoker    Packs/day: 0.25    Years: 30.00    Types: Cigarettes    Quit date: 04/22/2015  . Smokeless tobacco: Never Used  . Alcohol use Yes     Comment: Occasional  . Drug use: No  . Sexual activity: Not on file   Other Topics Concern  . Not on file   Social History Narrative  . No narrative on file  Family History  Problem Relation Age of Onset  . Colon cancer Paternal Grandmother 59  . Esophageal cancer Paternal Uncle   . Heart disease Father   . Lung cancer Father   . Lung cancer Maternal Grandfather   . Breast cancer Maternal Grandmother   . Rectal cancer Neg Hx   . Stomach cancer Neg Hx      Review of Systems  Constitutional: Negative.  Negative for chills and fever.  Respiratory: Negative for shortness of breath.   Cardiovascular: Negative for chest pain and palpitations.  Gastrointestinal: Negative for abdominal pain, blood in stool, melena, nausea and vomiting.  Genitourinary: Negative for hematuria.  Musculoskeletal: Positive for back pain.  Skin: Negative.  Negative for rash.  Neurological: Negative for dizziness, sensory change, focal weakness and headaches.  Endo/Heme/Allergies: Negative.   All other systems reviewed and are  negative.  Vitals:   06/10/17 1421  BP: 134/80  Pulse: 77  Resp: 20  Temp: 98.7 F (37.1 C)  SpO2: 96%     Physical Exam  Constitutional: She is oriented to person, place, and time. She appears well-developed and well-nourished.  HENT:  Head: Normocephalic and atraumatic.  Eyes: Pupils are equal, round, and reactive to light. EOM are normal.  Neck: Normal range of motion. Neck supple.  Cardiovascular: Normal rate, regular rhythm and normal heart sounds.   Pulmonary/Chest: Effort normal and breath sounds normal.  Abdominal: Soft. Bowel sounds are normal. She exhibits no distension. There is no tenderness.  Musculoskeletal:       Lumbar back: She exhibits normal range of motion, no tenderness, no bony tenderness, no pain and no spasm.  Left hip: mildly tender Buttock: mild deep tenderness to left gluteal muscle LUE: NVI with FROM, WNL.  Neurological: She is alert and oriented to person, place, and time. She displays normal reflexes. No sensory deficit. She exhibits normal muscle tone.  Skin: Skin is warm and dry. Capillary refill takes less than 2 seconds. No rash noted.  Psychiatric: She has a normal mood and affect. Her behavior is normal.  Vitals reviewed.    ASSESSMENT & PLAN: Kimberly King was seen today for pulled muscle.  Diagnoses and all orders for this visit:  Muscle strain  Musculoskeletal pain  Pulled muscle  Need for prophylactic vaccination and inoculation against influenza  Need for prophylactic vaccination against Streptococcus pneumoniae (pneumococcus)  Other orders -     cyclobenzaprine (FLEXERIL) 10 MG tablet; Take 1 tablet (10 mg total) by mouth 3 (three) times daily as needed for muscle spasms.    Patient Instructions  Muscle Strain A muscle strain (pulled muscle) happens when a muscle is stretched beyond normal length. It happens when a sudden, violent force stretches your muscle too far. Usually, a few of the fibers in your muscle are torn. Muscle  strain is common in athletes. Recovery usually takes 1-2 weeks. Complete healing takes 5-6 weeks. Follow these instructions at home:  Follow the PRICE method of treatment to help your injury get better. Do this the first 2-3 days after the injury: ? Protect. Protect the muscle to keep it from getting injured again. ? Rest. Limit your activity and rest the injured body part. ? Ice. Put ice in a plastic bag. Place a towel between your skin and the bag. Then, apply the ice and leave it on from 15-20 minutes each hour. After the third day, switch to moist heat packs. ? Compression. Use a splint or elastic bandage on the injured area for comfort.  Do not put it on too tightly. ? Elevate. Keep the injured body part above the level of your heart.  Only take medicine as told by your doctor.  Warm up before doing exercise to prevent future muscle strains. Contact a doctor if:  You have more pain or puffiness (swelling) in the injured area.  You feel numbness, tingling, or notice a loss of strength in the injured area. This information is not intended to replace advice given to you by your health care provider. Make sure you discuss any questions you have with your health care provider. Document Released: 07/05/2008 Document Revised: 03/03/2016 Document Reviewed: 04/25/2013 Elsevier Interactive Patient Education  2017 Elsevier Inc.     Agustina Caroli, MD [facility]

## 2017-06-10 NOTE — Patient Instructions (Signed)
Muscle Strain A muscle strain (pulled muscle) happens when a muscle is stretched beyond normal length. It happens when a sudden, violent force stretches your muscle too far. Usually, a few of the fibers in your muscle are torn. Muscle strain is common in athletes. Recovery usually takes 1-2 weeks. Complete healing takes 5-6 weeks. Follow these instructions at home:  Follow the PRICE method of treatment to help your injury get better. Do this the first 2-3 days after the injury: ? Protect. Protect the muscle to keep it from getting injured again. ? Rest. Limit your activity and rest the injured body part. ? Ice. Put ice in a plastic bag. Place a towel between your skin and the bag. Then, apply the ice and leave it on from 15-20 minutes each hour. After the third day, switch to moist heat packs. ? Compression. Use a splint or elastic bandage on the injured area for comfort. Do not put it on too tightly. ? Elevate. Keep the injured body part above the level of your heart.  Only take medicine as told by your doctor.  Warm up before doing exercise to prevent future muscle strains. Contact a doctor if:  You have more pain or puffiness (swelling) in the injured area.  You feel numbness, tingling, or notice a loss of strength in the injured area. This information is not intended to replace advice given to you by your health care provider. Make sure you discuss any questions you have with your health care provider. Document Released: 07/05/2008 Document Revised: 03/03/2016 Document Reviewed: 04/25/2013 Elsevier Interactive Patient Education  2017 Elsevier Inc.  

## 2017-06-10 NOTE — Progress Notes (Signed)
     IF you received an x-ray today, you will receive an invoice from [facility]. Please contact [facility] at [phone] with questions or concerns regarding your invoice.   IF you received labwork today, you will receive an invoice from [facility]. Please contact [facility] at [phone] with questions or concerns regarding your invoice.   Our billing staff will not be able to assist you with questions regarding bills from these companies.  You will be contacted with the lab results as soon as they are available. The fastest way to get your results is to activate your My Chart account. Instructions are located on the last page of this paperwork. If you have not heard from us regarding the results in 2 weeks, please contact this office.     

## 2017-06-27 ENCOUNTER — Encounter: Payer: Self-pay | Admitting: Internal Medicine

## 2017-06-27 ENCOUNTER — Telehealth: Payer: Self-pay | Admitting: Internal Medicine

## 2017-06-27 ENCOUNTER — Ambulatory Visit (HOSPITAL_BASED_OUTPATIENT_CLINIC_OR_DEPARTMENT_OTHER): Payer: Medicare Other | Admitting: Internal Medicine

## 2017-06-27 ENCOUNTER — Other Ambulatory Visit (HOSPITAL_BASED_OUTPATIENT_CLINIC_OR_DEPARTMENT_OTHER): Payer: Medicare Other

## 2017-06-27 VITALS — BP 153/83 | HR 78 | Temp 98.4°F | Resp 18 | Ht 63.5 in | Wt 177.2 lb

## 2017-06-27 DIAGNOSIS — C9110 Chronic lymphocytic leukemia of B-cell type not having achieved remission: Secondary | ICD-10-CM | POA: Diagnosis not present

## 2017-06-27 LAB — CBC WITH DIFFERENTIAL/PLATELET
BASO%: 0.6 % (ref 0.0–2.0)
Basophils Absolute: 0.1 10*3/uL (ref 0.0–0.1)
EOS%: 1.3 % (ref 0.0–7.0)
Eosinophils Absolute: 0.2 10*3/uL (ref 0.0–0.5)
HCT: 35.9 % (ref 34.8–46.6)
HEMOGLOBIN: 11.5 g/dL — AB (ref 11.6–15.9)
LYMPH#: 9.5 10*3/uL — AB (ref 0.9–3.3)
LYMPH%: 57.1 % — AB (ref 14.0–49.7)
MCH: 28.1 pg (ref 25.1–34.0)
MCHC: 32 g/dL (ref 31.5–36.0)
MCV: 87.8 fL (ref 79.5–101.0)
MONO#: 0.9 10*3/uL (ref 0.1–0.9)
MONO%: 5.1 % (ref 0.0–14.0)
NEUT#: 6 10*3/uL (ref 1.5–6.5)
NEUT%: 35.9 % — AB (ref 38.4–76.8)
Platelets: 405 10*3/uL — ABNORMAL HIGH (ref 145–400)
RBC: 4.09 10*6/uL (ref 3.70–5.45)
RDW: 14.9 % — ABNORMAL HIGH (ref 11.2–14.5)
WBC: 16.6 10*3/uL — ABNORMAL HIGH (ref 3.9–10.3)
nRBC: 0 % (ref 0–0)

## 2017-06-27 LAB — COMPREHENSIVE METABOLIC PANEL
ALBUMIN: 4.1 g/dL (ref 3.5–5.0)
ALK PHOS: 87 U/L (ref 40–150)
ALT: 32 U/L (ref 0–55)
AST: 21 U/L (ref 5–34)
Anion Gap: 9 mEq/L (ref 3–11)
BUN: 22.4 mg/dL (ref 7.0–26.0)
CO2: 26 mEq/L (ref 22–29)
Calcium: 9.7 mg/dL (ref 8.4–10.4)
Chloride: 104 mEq/L (ref 98–109)
Creatinine: 0.8 mg/dL (ref 0.6–1.1)
EGFR: 76 mL/min/{1.73_m2} — AB (ref >90)
Glucose: 132 mg/dl (ref 70–140)
POTASSIUM: 3.5 meq/L (ref 3.5–5.1)
SODIUM: 139 meq/L (ref 136–145)
Total Bilirubin: 0.32 mg/dL (ref 0.20–1.20)
Total Protein: 6.9 g/dL (ref 6.4–8.3)

## 2017-06-27 LAB — LACTATE DEHYDROGENASE: LDH: 184 U/L (ref 125–245)

## 2017-06-27 LAB — TECHNOLOGIST REVIEW

## 2017-06-27 NOTE — Progress Notes (Signed)
[facility] [phone]   Fax:([phone]  OFFICE PROGRESS NOTE  Alycia Rossetti, MD [address] [place] [address]  DIAGNOSIS: Stage 0 chronic lymphocytic leukemia  PRIOR THERAPY: None  CURRENT THERAPY: Observation.  INTERVAL HISTORY: Kimberly King 66 y.o. female returns to the clinic today for six-month follow-up visit. The patient is feeling fine with no complaints today except for occasional petechiae in her lower extremities. She denied having any recent weight loss or night sweats. She has no chest pain, shortness of breath, cough or hemoptysis. She denied having any palpable lymphadenopathy. She has no significant bruises or ecchymosis. The patient is here today for evaluation with repeat CBC.  MEDICAL HISTORY: Past Medical History:  Diagnosis Date  . Allergy   . Anxiety   . Arthritis    knees hips neck  . Cancer [facility])    leukemia dx 06/2013 - no current treatment - being monitored  . Cataract    bilateral - being monitored, no treatment currently  . Depression   . GERD (gastroesophageal reflux disease)   . Hypertension   . Leukocytosis    and low blood sodium   . Migraines   . Smoker     ALLERGIES:  has No Known Allergies.  MEDICATIONS:  Current Outpatient Prescriptions  Medication Sig Dispense Refill  . Calcium Carbonate-Vit D-Min (CALCIUM 1200 PO) Take by mouth.    . cetirizine (ZYRTEC) 10 MG tablet Take 10 mg by mouth daily as needed for allergies.    . cyclobenzaprine (FLEXERIL) 10 MG tablet Take 1 tablet (10 mg total) by mouth 3 (three) times daily as needed for muscle spasms. 30 tablet 0  . diclofenac (VOLTAREN) 75 MG EC tablet Take 75 mg by mouth 2 (two) times daily.  3  . lidocaine (LIDODERM) 5 %     . Lifitegrast (XIIDRA) 5 % SOLN Apply to eye.    . pantoprazole (PROTONIX) 40 MG tablet Take 1 tablet (40 mg total) by mouth 2 (two) times daily before a meal. 60 tablet 3  . SUMAtriptan (IMITREX) 50 MG  tablet Take 50 mg by mouth every 2 (two) hours as needed for migraine. Reported on 04/29/2016    . traMADol (ULTRAM) 50 MG tablet Take by mouth every 6 (six) hours as needed. Reported on 12/24/2015     No current facility-administered medications for this visit.     SURGICAL HISTORY:  Past Surgical History:  Procedure Laterality Date  . APPENDECTOMY    . BLADDER SURGERY     tack  . CERVICAL FUSION     x2  -  C5-C6  . COLONOSCOPY  2014   pyrtle - hx polyps  . DILATION AND CURETTAGE OF UTERUS     x 2  . TONSILLECTOMY      REVIEW OF SYSTEMS:  A comprehensive review of systems was negative.   PHYSICAL EXAMINATION: General appearance: alert, cooperative and no distress Head: Normocephalic, without obvious abnormality, atraumatic Neck: no adenopathy, no JVD, supple, symmetrical, trachea midline and thyroid not enlarged, symmetric, no tenderness/mass/nodules Lymph nodes: Cervical, supraclavicular, and axillary nodes normal. Resp: clear to auscultation bilaterally Back: symmetric, no curvature. ROM normal. No CVA tenderness. Cardio: regular rate and rhythm, S1, S2 normal, no murmur, click, rub or gallop GI: soft, non-tender; bowel sounds normal; no masses,  no organomegaly Extremities: extremities normal, atraumatic, no cyanosis or edema  ECOG PERFORMANCE STATUS: 0 - Asymptomatic  Blood pressure (!) 153/83,  pulse 78, temperature 98.4 F (36.9 C), temperature source Oral, resp. rate 18, height 5' 3.5" (1.613 m), weight 177 lb 3.2 oz (80.4 kg), SpO2 98 %.  LABORATORY DATA: Lab Results  Component Value Date   WBC 16.6 (H) 06/27/2017   HGB 11.5 (L) 06/27/2017   HCT 35.9 06/27/2017   MCV 87.8 06/27/2017   PLT 405 (H) 06/27/2017      Chemistry      Component Value Date/Time   NA 137 06/28/2016 1324   K 3.6 06/28/2016 1324   CO2 28 06/28/2016 1324   BUN 14.0 06/28/2016 1324   CREATININE 0.9 06/28/2016 1324      Component Value Date/Time   CALCIUM 9.7 06/28/2016 1324    ALKPHOS 101 06/28/2016 1324   AST 25 06/28/2016 1324   ALT 37 06/28/2016 1324   BILITOT 0.31 06/28/2016 1324       RADIOGRAPHIC STUDIES: No results found.  ASSESSMENT AND PLAN:  This is a very pleasant 66 years old white female with a stage 0 chronic lymphocytic leukemia and currently on observation. The patient continues to do well with no specific complaints today except for occasional petechiae. CBC today showed total white blood count better than 6 months ago. Her platelets count are slightly above the normal range. I discussed the lab results with the patient today and recommended for her to continue on observation with repeat CBC, comprehensive metabolic panel and LDH in 6 months. She was advised to call immediately if she has any concerning symptoms in the interval. The patient voices understanding of current disease status and treatment options and is in agreement with the current care plan. All questions were answered. The patient knows to call the clinic with any problems, questions or concerns. We can certainly see the patient much sooner if necessary. I spent 10 minutes counseling the patient face to face. The total time spent in the appointment was 15 minutes.  Disclaimer: This note was dictated with voice recognition software. Similar sounding words can inadvertently be transcribed and may not be corrected upon review.

## 2017-06-27 NOTE — Telephone Encounter (Signed)
Scheduled appt per 9/18 los - lab and f/u in 6 months - patient aware of apt - did not want AVS or calender.

## 2017-06-28 DIAGNOSIS — M1712 Unilateral primary osteoarthritis, left knee: Secondary | ICD-10-CM | POA: Diagnosis not present

## 2017-06-30 ENCOUNTER — Encounter: Payer: Self-pay | Admitting: Family Medicine

## 2017-06-30 ENCOUNTER — Ambulatory Visit (INDEPENDENT_AMBULATORY_CARE_PROVIDER_SITE_OTHER): Payer: Medicare Other | Admitting: Family Medicine

## 2017-06-30 VITALS — BP 140/82 | HR 78 | Temp 98.0°F | Resp 14 | Ht 63.5 in | Wt 176.0 lb

## 2017-06-30 DIAGNOSIS — E041 Nontoxic single thyroid nodule: Secondary | ICD-10-CM | POA: Insufficient documentation

## 2017-06-30 DIAGNOSIS — N952 Postmenopausal atrophic vaginitis: Secondary | ICD-10-CM | POA: Diagnosis not present

## 2017-06-30 DIAGNOSIS — C9190 Lymphoid leukemia, unspecified not having achieved remission: Secondary | ICD-10-CM | POA: Diagnosis not present

## 2017-06-30 DIAGNOSIS — M542 Cervicalgia: Secondary | ICD-10-CM | POA: Diagnosis not present

## 2017-06-30 DIAGNOSIS — K219 Gastro-esophageal reflux disease without esophagitis: Secondary | ICD-10-CM | POA: Diagnosis not present

## 2017-06-30 DIAGNOSIS — G43709 Chronic migraine without aura, not intractable, without status migrainosus: Secondary | ICD-10-CM | POA: Diagnosis not present

## 2017-06-30 DIAGNOSIS — G8929 Other chronic pain: Secondary | ICD-10-CM

## 2017-06-30 DIAGNOSIS — M549 Dorsalgia, unspecified: Secondary | ICD-10-CM

## 2017-06-30 DIAGNOSIS — C9110 Chronic lymphocytic leukemia of B-cell type not having achieved remission: Secondary | ICD-10-CM

## 2017-06-30 MED ORDER — ESTROGENS, CONJUGATED 0.625 MG/GM VA CREA: 1.0000 | TOPICAL_CREAM | Freq: Every day | VAGINAL | 12 refills | Status: DC

## 2017-06-30 MED ORDER — CLOBETASOL PROPIONATE 0.05 % EX OINT: 1.0000 "application " | TOPICAL_OINTMENT | CUTANEOUS | 2 refills | Status: DC

## 2017-06-30 MED ORDER — PANTOPRAZOLE SODIUM 40 MG PO TBEC: 40.0000 mg | DELAYED_RELEASE_TABLET | Freq: Every day | ORAL | 3 refills | Status: DC

## 2017-06-30 NOTE — Progress Notes (Signed)
Subjective:    Patient ID: Kimberly King Line, female    DOB: 1951/09/19, 66 y.o.   MRN: [id number]  Patient presents for [facility] (is not fasting)   Pt here to establish care- previous PCP - [facility]    [facility]- has OA of bilat knee, has tried injections, has MRI coming up/ chronic right hip pain- Dr. Veverly Fells     Dr., Sherwood Gambler- Has had cervical fusion in past, still followed by neurosurgery , has lumbar stenosis but no chronic back pain   Takes Diclofenac daily and ultram - last filled in Feb #120  Oncology- CLL , now getting some , some petchieal spots on arms have come up, while on vacation had legs, had had o   Endocrinology- Had mildly abnormal TFT recheck was normal, no current thyroid disorder to be treated   had thyroid nodule   GI- Dr. Hilarie Fredrickson- Colonoscopy August now every 5  Year cycle  Due to previous polyps and family history   Exercise- Yoga/swimming/   PAP Smear OB/GYN - last done 2 year ago /She has vaginal atrophy and uses Premarin cream as well as topicalclobetasol very sparingly.  Opthalmologist- Dr. Herbert Deaner - pigment dispersion, pre glaucoma  Bone Density - [facility]   No current blood pressure medicaiton or issues   Migraines disoder-  Rare migraines, has imitrex on hand   GERD- on protonix   Dermatology - Skin history of BCC had MOH;s surgery has divet on nose, had melanom in 2000 on calf    Review Of Systems:  GEN- denies fatigue, fever, weight loss,weakness, recent illness HEENT- denies eye drainage, change in vision, nasal discharge, CVS- denies chest pain, palpitations RESP- denies SOB, cough, wheeze ABD- denies N/V, change in stools, abd pain GU- denies dysuria, hematuria, dribbling, incontinence MSK- + joint pain, muscle aches, injury Neuro- denies headache, dizziness, syncope, seizure activity       Objective:    BP 140/82   Pulse 78   Temp 98 F (36.7 C) (Oral)   Resp 14   Ht 5' 3.5"  (1.613 m)   Wt 176 lb (79.8 kg)   SpO2 97%   BMI 30.69 kg/m  GEN- NAD, alert and oriented x3 HEENT- PERRL, EOMI, non injected sclera, pink conjunctiva, MMM, oropharynx clear Neck- Supple, no thyromegaly CVS- RRR, no murmur RESP-CTAB ABD-NABS,soft,NT,ND Psych- normal affect and mood, very pleasant EXT- No edema Pulses- Radial, DP- 2+        Assessment & Plan:      Problem List Items Addressed This Visit      Unprioritized   Vaginal atrophy    Sparingly use of premarin cream and topical steroid      Thyroid nodule    Repeat thyroid ultrasound TFT have been normal, cleared by endocrinology      Migraines    imitrex as needed      GERD (gastroesophageal reflux disease)    Continue protonix, uses chronic low dose NSAID as well      Relevant Medications   pantoprazole (PROTONIX) 40 MG tablet   CLL (chronic lymphocytic leukemia) (HCC)    Continue with oncology Few petichieal lesions on arms, seems these come and go on her body, advised if they spread or do not resolve to call her oncologist  No active treatment for CLL  Obtain records from previous PCP/GYN Reviewed some of the labs in the system  Immunizations UTD except shingles due to CLL  Chronic neck and back pain    folllowed by her surgeon Given ultram but prescribed by PCP now, last filled in Feb, still has good amount left       Other Visit Diagnoses    Chronic lymphocytic leukemia ([place])    -  Primary      Note: This dictation was prepared with Dragon dictation along with smaller phrase technology. Any transcriptional errors that result from this process are unintentional.

## 2017-06-30 NOTE — Patient Instructions (Addendum)
Ultrasound to be done of thyroid  Release of records- [facility]  Release of records- [facility]- Last PAP  F/U 6 months

## 2017-07-01 DIAGNOSIS — M549 Dorsalgia, unspecified: Secondary | ICD-10-CM

## 2017-07-01 DIAGNOSIS — G43909 Migraine, unspecified, not intractable, without status migrainosus: Secondary | ICD-10-CM | POA: Insufficient documentation

## 2017-07-01 DIAGNOSIS — M542 Cervicalgia: Secondary | ICD-10-CM | POA: Insufficient documentation

## 2017-07-01 DIAGNOSIS — G8929 Other chronic pain: Secondary | ICD-10-CM | POA: Insufficient documentation

## 2017-07-01 NOTE — Assessment & Plan Note (Signed)
imitrex as needed 

## 2017-07-01 NOTE — Assessment & Plan Note (Signed)
Repeat thyroid ultrasound TFT have been normal, cleared by endocrinology

## 2017-07-01 NOTE — Assessment & Plan Note (Signed)
Continue protonix, uses chronic low dose NSAID as well

## 2017-07-01 NOTE — Assessment & Plan Note (Addendum)
Continue with oncology Few petichieal lesions on arms, seems these come and go on her body, advised if they spread or do not resolve to call her oncologist  No active treatment for CLL  Obtain records from previous PCP/GYN Reviewed some of the labs in the system  Immunizations UTD except shingles due to CLL

## 2017-07-01 NOTE — Assessment & Plan Note (Signed)
Sparingly use of premarin cream and topical steroid

## 2017-07-01 NOTE — Assessment & Plan Note (Signed)
folllowed by her surgeon Given ultram but prescribed by PCP now, last filled in Feb, still has good amount left

## 2017-07-04 DIAGNOSIS — M1712 Unilateral primary osteoarthritis, left knee: Secondary | ICD-10-CM | POA: Diagnosis not present

## 2017-07-06 ENCOUNTER — Other Ambulatory Visit: Payer: Self-pay | Admitting: Family Medicine

## 2017-07-06 DIAGNOSIS — E041 Nontoxic single thyroid nodule: Secondary | ICD-10-CM

## 2017-07-10 ENCOUNTER — Telehealth: Payer: Self-pay

## 2017-07-10 ENCOUNTER — Ambulatory Visit (HOSPITAL_BASED_OUTPATIENT_CLINIC_OR_DEPARTMENT_OTHER): Payer: Medicare Other

## 2017-07-10 DIAGNOSIS — C9190 Lymphoid leukemia, unspecified not having achieved remission: Secondary | ICD-10-CM

## 2017-07-10 DIAGNOSIS — C9110 Chronic lymphocytic leukemia of B-cell type not having achieved remission: Secondary | ICD-10-CM | POA: Diagnosis present

## 2017-07-10 LAB — CBC WITH DIFFERENTIAL/PLATELET
BASO%: 0.9 % (ref 0.0–2.0)
Basophils Absolute: 0.1 10*3/uL (ref 0.0–0.1)
EOS%: 1.8 % (ref 0.0–7.0)
Eosinophils Absolute: 0.3 10*3/uL (ref 0.0–0.5)
HCT: 35.6 % (ref 34.8–46.6)
HEMOGLOBIN: 11.3 g/dL — AB (ref 11.6–15.9)
LYMPH%: 55.4 % — AB (ref 14.0–49.7)
MCH: 27.9 pg (ref 25.1–34.0)
MCHC: 31.6 g/dL (ref 31.5–36.0)
MCV: 88.1 fL (ref 79.5–101.0)
MONO#: 1 10*3/uL — ABNORMAL HIGH (ref 0.1–0.9)
MONO%: 6.2 % (ref 0.0–14.0)
NEUT%: 35.7 % — ABNORMAL LOW (ref 38.4–76.8)
NEUTROS ABS: 5.7 10*3/uL (ref 1.5–6.5)
Platelets: 315 10*3/uL (ref 145–400)
RBC: 4.05 10*6/uL (ref 3.70–5.45)
RDW: 15.7 % — AB (ref 11.2–14.5)
WBC: 16 10*3/uL — AB (ref 3.9–10.3)
lymph#: 8.9 10*3/uL — ABNORMAL HIGH (ref 0.9–3.3)

## 2017-07-10 LAB — TECHNOLOGIST REVIEW

## 2017-07-10 NOTE — Telephone Encounter (Signed)
Pt called with red spots on both arms and her left leg. She saw her PCP last Friday and he said to call Dr Julien Nordmann.   She has both tiny spots and big spots. No itching or rash. Don't turn white when pressed on. Not sore, not raised. They started popping up sept 20th.   S/w dr Julien Nordmann and need to check CBC. Pt will come in about 1300. This RN will check the petechiae.

## 2017-07-10 NOTE — Telephone Encounter (Signed)
This RN looked at small bruises/ large petechiae on arms and legs. Not clinically significant at this time. Instructed pt to be watchful and call if they increase in number dramatically or do not heal. They may come and go and that is OK. Pt was reassured "I just didn't know".

## 2017-07-11 ENCOUNTER — Other Ambulatory Visit: Payer: Medicare Other

## 2017-07-11 DIAGNOSIS — M9901 Segmental and somatic dysfunction of cervical region: Secondary | ICD-10-CM | POA: Diagnosis not present

## 2017-07-11 DIAGNOSIS — M9903 Segmental and somatic dysfunction of lumbar region: Secondary | ICD-10-CM | POA: Diagnosis not present

## 2017-07-11 DIAGNOSIS — M9902 Segmental and somatic dysfunction of thoracic region: Secondary | ICD-10-CM | POA: Diagnosis not present

## 2017-07-11 DIAGNOSIS — M546 Pain in thoracic spine: Secondary | ICD-10-CM | POA: Diagnosis not present

## 2017-07-11 DIAGNOSIS — M47816 Spondylosis without myelopathy or radiculopathy, lumbar region: Secondary | ICD-10-CM | POA: Diagnosis not present

## 2017-07-11 DIAGNOSIS — M47812 Spondylosis without myelopathy or radiculopathy, cervical region: Secondary | ICD-10-CM | POA: Diagnosis not present

## 2017-07-12 ENCOUNTER — Ambulatory Visit
Admission: RE | Admit: 2017-07-12 | Discharge: 2017-07-12 | Disposition: A | Discharge status: Home / Self Care | Payer: Medicare Other | Source: Ambulatory Visit | Attending: Family Medicine | Admitting: Family Medicine

## 2017-07-12 ENCOUNTER — Encounter: Payer: Self-pay | Admitting: *Deleted

## 2017-07-12 DIAGNOSIS — E041 Nontoxic single thyroid nodule: Secondary | ICD-10-CM

## 2017-07-12 DIAGNOSIS — S83232A Complex tear of medial meniscus, current injury, left knee, initial encounter: Secondary | ICD-10-CM | POA: Diagnosis not present

## 2017-07-12 DIAGNOSIS — R918 Other nonspecific abnormal finding of lung field: Secondary | ICD-10-CM | POA: Diagnosis not present

## 2017-07-13 ENCOUNTER — Telehealth: Payer: Self-pay | Admitting: Family Medicine

## 2017-07-13 DIAGNOSIS — C9110 Chronic lymphocytic leukemia of B-cell type not having achieved remission: Secondary | ICD-10-CM

## 2017-07-13 MED ORDER — TRAMADOL HCL 50 MG PO TABS: 50.0000 mg | ORAL_TABLET | Freq: Four times a day (QID) | ORAL | 0 refills | Status: DC | PRN

## 2017-07-13 NOTE — Telephone Encounter (Signed)
Ok to refill 

## 2017-07-13 NOTE — Telephone Encounter (Signed)
[facility]  Patient has changed pharmacies and would like her tramadol called in if possible

## 2017-07-13 NOTE — Telephone Encounter (Signed)
Okay to refill  Ultram 1 tab q 6 hours prn pain #120 tab, NO Refills

## 2017-07-13 NOTE — Telephone Encounter (Signed)
Medication called/sent to requested pharmacy and pt aware 

## 2017-07-14 ENCOUNTER — Encounter: Payer: Self-pay | Admitting: Family Medicine

## 2017-07-20 ENCOUNTER — Telehealth: Payer: Self-pay | Admitting: Internal Medicine

## 2017-07-20 NOTE — Telephone Encounter (Signed)
Faxed office note and lab to [facility] [phone]

## 2017-07-25 DIAGNOSIS — Y999 Unspecified external cause status: Secondary | ICD-10-CM | POA: Diagnosis not present

## 2017-07-25 DIAGNOSIS — S83232A Complex tear of medial meniscus, current injury, left knee, initial encounter: Secondary | ICD-10-CM | POA: Diagnosis not present

## 2017-07-25 DIAGNOSIS — M94262 Chondromalacia, left knee: Secondary | ICD-10-CM | POA: Diagnosis not present

## 2017-07-25 DIAGNOSIS — X58XXXA Exposure to other specified factors, initial encounter: Secondary | ICD-10-CM | POA: Diagnosis not present

## 2017-07-25 DIAGNOSIS — G8918 Other acute postprocedural pain: Secondary | ICD-10-CM | POA: Diagnosis not present

## 2017-07-25 DIAGNOSIS — S83282A Other tear of lateral meniscus, current injury, left knee, initial encounter: Secondary | ICD-10-CM | POA: Diagnosis not present

## 2017-07-25 DIAGNOSIS — M2242 Chondromalacia patellae, left knee: Secondary | ICD-10-CM | POA: Diagnosis not present

## 2017-07-25 DIAGNOSIS — M23362 Other meniscus derangements, other lateral meniscus, left knee: Secondary | ICD-10-CM | POA: Diagnosis not present

## 2017-07-25 DIAGNOSIS — M23332 Other meniscus derangements, other medial meniscus, left knee: Secondary | ICD-10-CM | POA: Diagnosis not present

## 2017-08-01 DIAGNOSIS — Z9889 Other specified postprocedural states: Secondary | ICD-10-CM | POA: Diagnosis not present

## 2017-08-01 DIAGNOSIS — M25562 Pain in left knee: Secondary | ICD-10-CM | POA: Diagnosis not present

## 2017-08-01 DIAGNOSIS — S83232D Complex tear of medial meniscus, current injury, left knee, subsequent encounter: Secondary | ICD-10-CM | POA: Diagnosis not present

## 2017-08-01 DIAGNOSIS — M1712 Unilateral primary osteoarthritis, left knee: Secondary | ICD-10-CM | POA: Diagnosis not present

## 2017-08-04 DIAGNOSIS — M25562 Pain in left knee: Secondary | ICD-10-CM | POA: Diagnosis not present

## 2017-08-07 DIAGNOSIS — M25562 Pain in left knee: Secondary | ICD-10-CM | POA: Diagnosis not present

## 2017-08-08 DIAGNOSIS — M9901 Segmental and somatic dysfunction of cervical region: Secondary | ICD-10-CM | POA: Diagnosis not present

## 2017-08-08 DIAGNOSIS — M546 Pain in thoracic spine: Secondary | ICD-10-CM | POA: Diagnosis not present

## 2017-08-08 DIAGNOSIS — M47816 Spondylosis without myelopathy or radiculopathy, lumbar region: Secondary | ICD-10-CM | POA: Diagnosis not present

## 2017-08-08 DIAGNOSIS — M47812 Spondylosis without myelopathy or radiculopathy, cervical region: Secondary | ICD-10-CM | POA: Diagnosis not present

## 2017-08-08 DIAGNOSIS — M9903 Segmental and somatic dysfunction of lumbar region: Secondary | ICD-10-CM | POA: Diagnosis not present

## 2017-08-08 DIAGNOSIS — M9902 Segmental and somatic dysfunction of thoracic region: Secondary | ICD-10-CM | POA: Diagnosis not present

## 2017-08-10 DIAGNOSIS — M25562 Pain in left knee: Secondary | ICD-10-CM | POA: Diagnosis not present

## 2017-08-14 DIAGNOSIS — M25562 Pain in left knee: Secondary | ICD-10-CM | POA: Diagnosis not present

## 2017-08-17 DIAGNOSIS — M25562 Pain in left knee: Secondary | ICD-10-CM | POA: Diagnosis not present

## 2017-08-21 DIAGNOSIS — M25562 Pain in left knee: Secondary | ICD-10-CM | POA: Diagnosis not present

## 2017-08-24 DIAGNOSIS — M25562 Pain in left knee: Secondary | ICD-10-CM | POA: Diagnosis not present

## 2017-08-28 ENCOUNTER — Other Ambulatory Visit: Payer: Self-pay | Admitting: Family Medicine

## 2017-08-28 DIAGNOSIS — Z1231 Encounter for screening mammogram for malignant neoplasm of breast: Secondary | ICD-10-CM

## 2017-08-30 DIAGNOSIS — Z9889 Other specified postprocedural states: Secondary | ICD-10-CM | POA: Diagnosis not present

## 2017-08-30 DIAGNOSIS — S83232D Complex tear of medial meniscus, current injury, left knee, subsequent encounter: Secondary | ICD-10-CM | POA: Diagnosis not present

## 2017-08-30 DIAGNOSIS — M1712 Unilateral primary osteoarthritis, left knee: Secondary | ICD-10-CM | POA: Diagnosis not present

## 2017-09-07 DIAGNOSIS — M9903 Segmental and somatic dysfunction of lumbar region: Secondary | ICD-10-CM | POA: Diagnosis not present

## 2017-09-07 DIAGNOSIS — M9902 Segmental and somatic dysfunction of thoracic region: Secondary | ICD-10-CM | POA: Diagnosis not present

## 2017-09-07 DIAGNOSIS — M47812 Spondylosis without myelopathy or radiculopathy, cervical region: Secondary | ICD-10-CM | POA: Diagnosis not present

## 2017-09-07 DIAGNOSIS — M47816 Spondylosis without myelopathy or radiculopathy, lumbar region: Secondary | ICD-10-CM | POA: Diagnosis not present

## 2017-09-07 DIAGNOSIS — M9901 Segmental and somatic dysfunction of cervical region: Secondary | ICD-10-CM | POA: Diagnosis not present

## 2017-09-07 DIAGNOSIS — M546 Pain in thoracic spine: Secondary | ICD-10-CM | POA: Diagnosis not present

## 2017-09-11 ENCOUNTER — Ambulatory Visit (HOSPITAL_COMMUNITY)
Admission: RE | Admit: 2017-09-11 | Discharge: 2017-09-11 | Disposition: A | Discharge status: Home / Self Care | Payer: Medicare Other | Source: Ambulatory Visit | Attending: Family Medicine | Admitting: Family Medicine

## 2017-09-11 ENCOUNTER — Encounter (HOSPITAL_COMMUNITY): Payer: Self-pay

## 2017-09-11 DIAGNOSIS — Z1231 Encounter for screening mammogram for malignant neoplasm of breast: Secondary | ICD-10-CM | POA: Diagnosis not present

## 2017-09-25 ENCOUNTER — Telehealth: Payer: Self-pay | Admitting: *Deleted

## 2017-09-25 DIAGNOSIS — C9110 Chronic lymphocytic leukemia of B-cell type not having achieved remission: Secondary | ICD-10-CM

## 2017-09-25 NOTE — Telephone Encounter (Signed)
Received fax requesting refill on Tramadol.   Ok to refill??  Last office visit 06/30/2017.  Last refill 07/13/2017.

## 2017-09-25 NOTE — Telephone Encounter (Signed)
Okay to refill? 

## 2017-09-26 ENCOUNTER — Other Ambulatory Visit: Payer: Self-pay | Admitting: Neurosurgery

## 2017-09-26 DIAGNOSIS — M542 Cervicalgia: Secondary | ICD-10-CM

## 2017-09-28 MED ORDER — TRAMADOL HCL 50 MG PO TABS: 50.0000 mg | ORAL_TABLET | Freq: Four times a day (QID) | ORAL | 0 refills | Status: DC | PRN

## 2017-09-28 NOTE — Telephone Encounter (Signed)
Medication called to pharmacy. 

## 2017-10-06 DIAGNOSIS — M47812 Spondylosis without myelopathy or radiculopathy, cervical region: Secondary | ICD-10-CM | POA: Diagnosis not present

## 2017-10-06 DIAGNOSIS — M9902 Segmental and somatic dysfunction of thoracic region: Secondary | ICD-10-CM | POA: Diagnosis not present

## 2017-10-06 DIAGNOSIS — M47816 Spondylosis without myelopathy or radiculopathy, lumbar region: Secondary | ICD-10-CM | POA: Diagnosis not present

## 2017-10-06 DIAGNOSIS — M9903 Segmental and somatic dysfunction of lumbar region: Secondary | ICD-10-CM | POA: Diagnosis not present

## 2017-10-06 DIAGNOSIS — M546 Pain in thoracic spine: Secondary | ICD-10-CM | POA: Diagnosis not present

## 2017-10-06 DIAGNOSIS — M9901 Segmental and somatic dysfunction of cervical region: Secondary | ICD-10-CM | POA: Diagnosis not present

## 2017-10-12 ENCOUNTER — Other Ambulatory Visit: Payer: Self-pay | Admitting: Neurosurgery

## 2017-10-12 ENCOUNTER — Ambulatory Visit
Admission: RE | Admit: 2017-10-12 | Discharge: 2017-10-12 | Disposition: A | Discharge status: Home / Self Care | Payer: Medicare Other | Source: Ambulatory Visit | Attending: Neurosurgery | Admitting: Neurosurgery

## 2017-10-12 DIAGNOSIS — M542 Cervicalgia: Secondary | ICD-10-CM

## 2017-10-12 MED ORDER — DEXAMETHASONE SODIUM PHOSPHATE 4 MG/ML IJ SOLN
4.0000 mg | Freq: Once | INTRAMUSCULAR | Status: AC
  Administered 2017-10-12: 4 mg via INTRA_ARTICULAR

## 2017-10-12 MED ORDER — IOPAMIDOL (ISOVUE-M 300) INJECTION 61%
1.0000 mL | Freq: Once | INTRAMUSCULAR | Status: AC | PRN
  Administered 2017-10-12: 1 mL via INTRA_ARTICULAR

## 2017-10-19 DIAGNOSIS — H25013 Cortical age-related cataract, bilateral: Secondary | ICD-10-CM | POA: Diagnosis not present

## 2017-10-19 DIAGNOSIS — H04123 Dry eye syndrome of bilateral lacrimal glands: Secondary | ICD-10-CM | POA: Diagnosis not present

## 2017-10-19 DIAGNOSIS — H2513 Age-related nuclear cataract, bilateral: Secondary | ICD-10-CM | POA: Diagnosis not present

## 2017-10-19 DIAGNOSIS — H40013 Open angle with borderline findings, low risk, bilateral: Secondary | ICD-10-CM | POA: Diagnosis not present

## 2017-10-19 LAB — HM DIABETES EYE EXAM

## 2017-10-30 DIAGNOSIS — M1712 Unilateral primary osteoarthritis, left knee: Secondary | ICD-10-CM | POA: Diagnosis not present

## 2017-11-07 DIAGNOSIS — M47812 Spondylosis without myelopathy or radiculopathy, cervical region: Secondary | ICD-10-CM | POA: Diagnosis not present

## 2017-11-07 DIAGNOSIS — M47816 Spondylosis without myelopathy or radiculopathy, lumbar region: Secondary | ICD-10-CM | POA: Diagnosis not present

## 2017-11-07 DIAGNOSIS — M9901 Segmental and somatic dysfunction of cervical region: Secondary | ICD-10-CM | POA: Diagnosis not present

## 2017-11-07 DIAGNOSIS — M546 Pain in thoracic spine: Secondary | ICD-10-CM | POA: Diagnosis not present

## 2017-11-07 DIAGNOSIS — M9902 Segmental and somatic dysfunction of thoracic region: Secondary | ICD-10-CM | POA: Diagnosis not present

## 2017-11-07 DIAGNOSIS — M9903 Segmental and somatic dysfunction of lumbar region: Secondary | ICD-10-CM | POA: Diagnosis not present

## 2017-11-08 ENCOUNTER — Other Ambulatory Visit: Payer: Self-pay

## 2017-11-08 ENCOUNTER — Ambulatory Visit (INDEPENDENT_AMBULATORY_CARE_PROVIDER_SITE_OTHER): Payer: Medicare Other | Admitting: Physician Assistant

## 2017-11-08 ENCOUNTER — Encounter: Payer: Self-pay | Admitting: Physician Assistant

## 2017-11-08 VITALS — BP 130/84 | HR 73 | Temp 97.9°F | Resp 16 | Wt 180.2 lb

## 2017-11-08 DIAGNOSIS — M170 Bilateral primary osteoarthritis of knee: Secondary | ICD-10-CM

## 2017-11-08 DIAGNOSIS — C9190 Lymphoid leukemia, unspecified not having achieved remission: Secondary | ICD-10-CM | POA: Diagnosis not present

## 2017-11-08 DIAGNOSIS — M542 Cervicalgia: Secondary | ICD-10-CM | POA: Diagnosis not present

## 2017-11-08 DIAGNOSIS — M25449 Effusion, unspecified hand: Secondary | ICD-10-CM

## 2017-11-08 DIAGNOSIS — M549 Dorsalgia, unspecified: Secondary | ICD-10-CM

## 2017-11-08 DIAGNOSIS — G8929 Other chronic pain: Secondary | ICD-10-CM | POA: Diagnosis not present

## 2017-11-08 DIAGNOSIS — E0590 Thyrotoxicosis, unspecified without thyrotoxic crisis or storm: Secondary | ICD-10-CM | POA: Diagnosis not present

## 2017-11-08 DIAGNOSIS — M79641 Pain in right hand: Secondary | ICD-10-CM | POA: Diagnosis not present

## 2017-11-08 DIAGNOSIS — M79604 Pain in right leg: Secondary | ICD-10-CM

## 2017-11-08 DIAGNOSIS — M79605 Pain in left leg: Secondary | ICD-10-CM

## 2017-11-08 DIAGNOSIS — M79642 Pain in left hand: Secondary | ICD-10-CM

## 2017-11-08 DIAGNOSIS — C9110 Chronic lymphocytic leukemia of B-cell type not having achieved remission: Secondary | ICD-10-CM

## 2017-11-08 DIAGNOSIS — M1712 Unilateral primary osteoarthritis, left knee: Secondary | ICD-10-CM | POA: Diagnosis not present

## 2017-11-08 DIAGNOSIS — Z1321 Encounter for screening for nutritional disorder: Secondary | ICD-10-CM | POA: Diagnosis not present

## 2017-11-08 NOTE — Progress Notes (Signed)
Patient ID: Kimberly King MRN: [id number], DOB: 09-Apr-1951, 67 y.o. Date of Encounter: @DATE @  Chief Complaint:  Chief Complaint  Patient presents with  . leg and hand pain    x1 month  both hands and legs     HPI: 67 y.o. year old female  presents with above.   She reports that she has been waking at 2 AM with throbbing in her lower legs bilaterally-- "from the knees down".  Says that this pain does not feel like it is in any of the joints.  Says that it feels like it is "deep in the bone" and points down her shin area bilaterally. She also has been feeling piercing sharp pains in her fingers.  Also achy discomfort in her fingers and her hands bilaterally.  States that she gets "gel to the left knee" and she discussed the symptoms with orthopedics but Ortho told her that her knees should not be causing these symptoms.  Also reports that she "has CLL but that is stage 0 " so does not think that should be causing it.  She also notes that she has had facet injections to her neck and has history of surgery to her neck and that she sees neurosurgery every 6 months. However states that the recent symptoms in her fingers and hands is different from any symptoms that she has ever had related to her neck.  Asked what type of work she has done.  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  Asked if she is doing any type of exercise or activity.  States that she was doing yoga and water aerobics.  However has not been doing any of the yoga since she had her knee surgery.  Says that because her knee is "bone on bone" her activity is limited and she cannot think of any activity she has done that would be causing these symptoms.     Past Medical History:  Diagnosis Date  . Allergy   . Anxiety   . Arthritis    knees hips neck  . Cancer [facility])    leukemia dx 06/2013 - no current treatment - being monitored  . Cataract    bilateral - being monitored, no treatment currently  . Depression   . GERD (gastroesophageal reflux disease)   . Leukocytosis    and low blood sodium   . Migraines   . Smoker      Home Meds: Outpatient Medications Prior to Visit  Medication Sig Dispense Refill  . Calcium Carbonate-Vit D-Min (CALCIUM 1200 PO) Take by mouth.    . clobetasol ointment (TEMOVATE) 1.49 % Apply 1 application topically 2 (two) times a week. 30 g 2  . conjugated estrogens (PREMARIN) vaginal cream Place 1 Applicatorful vaginally daily. 42.5 g 12  . diclofenac (VOLTAREN) 75 MG EC tablet Take 75 mg by mouth 2 (two) times daily.  3  . glucosamine-chondroitin 500-400 MG tablet Take 2 tablets by mouth 2 (two) times daily.    Marland Kitchen lidocaine (LIDODERM) 5 %     . Lifitegrast (XIIDRA) 5 % SOLN Apply to eye.    . Omega-3 Fatty Acids (FISH OIL) 1000 MG CAPS Take 1 capsule by mouth 2 (two) times daily.    . pantoprazole (PROTONIX) 40 MG tablet Take 1 tablet (40 mg total) by mouth daily. 90 tablet 3  . SUMAtriptan (IMITREX) 50 MG tablet Take 50 mg  by mouth every 2 (two) hours as needed for migraine. Reported on 04/29/2016    . traMADol (ULTRAM) 50 MG tablet Take 1 tablet (50 mg total) by mouth every 6 (six) hours as needed. Reported on 12/24/2015 120 tablet 0  . cetirizine (ZYRTEC) 10 MG tablet Take 10 mg by mouth daily as needed for allergies.     No facility-administered medications prior to visit.     Allergies: No Known Allergies  Social History   Socioeconomic History  . Marital status: Divorced    Spouse name: Not on file  . Number of children: Not on file  . Years of education: Not on file  . Highest education level: Not on file  Social Needs  . Financial resource strain: Not on file  . Food insecurity - worry: Not on file  . Food insecurity - inability: Not on file  . Transportation needs - medical: Not on file  . Transportation needs - non-medical: Not on file  Occupational History  . Not on file  Tobacco  Use  . Smoking status: Former Smoker    Packs/day: 0.25    Years: 30.00    Pack years: 7.50    Types: Cigarettes    Last attempt to quit: 04/22/2015    Years since quitting: 2.5  . Smokeless tobacco: Never Used  Substance and Sexual Activity  . Alcohol use: Yes    Comment: Occasional  . Drug use: No  . Sexual activity: Not on file  Other Topics Concern  . Not on file  Social History Narrative  . Not on file    Family History  Problem Relation Age of Onset  . Colon cancer Paternal Grandmother 67  . Esophageal cancer Paternal Uncle   . Arthritis Mother   . Heart disease Father   . Lung cancer Father   . Lung cancer Maternal Grandfather   . Breast cancer Maternal Grandmother   . Cirrhosis Brother   . Rectal cancer Neg Hx   . Stomach cancer Neg Hx      Review of Systems:  See HPI for pertinent ROS. All other ROS negative.    Physical Exam: Blood pressure 130/84, pulse 73, temperature 97.9 F (36.6 C), temperature source Oral, resp. rate 16, weight 81.7 kg (180 lb 3.2 oz), SpO2 97 %., Body mass index is 31.42 kg/m. General: WF Appears in no acute distress. Neck: Supple. No thyromegaly. No lymphadenopathy. Lungs: Clear bilaterally to auscultation without wheezes, rales, or rhonchi. Breathing is unlabored. Heart: RRR with S1 S2. No murmurs, rubs, or gallops. Musculoskeletal:  Strength and tone normal for age. Knees, Shins, Feet : Inspection normal bilaterally.  No erythema.  No warmth.  No tenderness with palpation. Hands: Right third finger PIP joint is slightly swollen.  No distinct nodes or deformities. Extremities/Skin: Warm and dry.  Neuro: Alert and oriented X 3. Moves all extremities spontaneously. Gait is normal. CNII-XII grossly in tact. Psych:  Responds to questions appropriately with a normal affect.     ASSESSMENT AND PLAN:  67 y.o. year old female with  1. Bilateral hand pain I asked if she needs medication to use for pain.   She states that she has  diclofenac and tramadol so does not need any additional medication for pain.   I will check lab and will follow up with her once I get these results. - CBC with Differential/Platelet - COMPLETE METABOLIC PANEL WITH GFR - TSH - ANA - Rheumatoid factor - CK - Vitamin B12  2. Swelling of finger joint, unspecified laterality I asked if she needs medication to use for pain.   She states that she has diclofenac and tramadol so does not need any additional medication for pain.   I will check lab and will follow up with her once I get these results.  - CBC with Differential/Platelet - COMPLETE METABOLIC PANEL WITH GFR - TSH - ANA - Rheumatoid factor - CK - Vitamin B12  3. Pain in both lower extremities I asked if she needs medication to use for pain.   She states that she has diclofenac and tramadol so does not need any additional medication for pain.   I will check lab and will follow up with her once I get these results.  - CBC with Differential/Platelet - COMPLETE METABOLIC PANEL WITH GFR - TSH - ANA - Rheumatoid factor - CK - Vitamin B12  4. Subclinical hyperthyroidism - TSH  5. CLL (chronic lymphocytic leukemia) (HCC) - CBC with Differential/Platelet  6. Chronic neck and back pain  7. Osteoarthritis of both knees, unspecified osteoarthritis type   Signed, [address] [place], [place], [facility] 11/08/2017 1:10 PM

## 2017-11-09 LAB — CBC WITH DIFFERENTIAL/PLATELET
BASOS ABS: 133 {cells}/uL (ref 0–200)
BASOS PCT: 0.7 %
Eosinophils Absolute: 266 cells/uL (ref 15–500)
Eosinophils Relative: 1.4 %
HCT: 34.3 % — ABNORMAL LOW (ref 35.0–45.0)
Hemoglobin: 11.2 g/dL — ABNORMAL LOW (ref 11.7–15.5)
Lymphs Abs: 10982 cells/uL — ABNORMAL HIGH (ref 850–3900)
MCH: 27.8 pg (ref 27.0–33.0)
MCHC: 32.7 g/dL (ref 32.0–36.0)
MCV: 85.1 fL (ref 80.0–100.0)
MONOS PCT: 6.9 %
MPV: 10.2 fL (ref 7.5–12.5)
Neutro Abs: 6308 cells/uL (ref 1500–7800)
Neutrophils Relative %: 33.2 %
PLATELETS: 406 10*3/uL — AB (ref 140–400)
RBC: 4.03 10*6/uL (ref 3.80–5.10)
RDW: 13.9 % (ref 11.0–15.0)
TOTAL LYMPHOCYTE: 57.8 %
WBC mixed population: 1311 cells/uL — ABNORMAL HIGH (ref 200–950)
WBC: 19 10*3/uL — ABNORMAL HIGH (ref 3.8–10.8)

## 2017-11-09 LAB — COMPLETE METABOLIC PANEL WITH GFR
AG Ratio: 2 (calc) (ref 1.0–2.5)
ALKALINE PHOSPHATASE (APISO): 98 U/L (ref 33–130)
ALT: 29 U/L (ref 6–29)
AST: 19 U/L (ref 10–35)
Albumin: 4.2 g/dL (ref 3.6–5.1)
BILIRUBIN TOTAL: 0.3 mg/dL (ref 0.2–1.2)
BUN: 22 mg/dL (ref 7–25)
CHLORIDE: 104 mmol/L (ref 98–110)
CO2: 26 mmol/L (ref 20–32)
Calcium: 9.6 mg/dL (ref 8.6–10.4)
Creat: 0.65 mg/dL (ref 0.50–0.99)
GFR, Est African American: 108 mL/min/{1.73_m2} (ref >=60)
GFR, Est Non African American: 93 mL/min/{1.73_m2} (ref >=60)
GLUCOSE: 115 mg/dL — AB (ref 65–99)
Globulin: 2.1 g/dL (calc) (ref 1.9–3.7)
Potassium: 4.3 mmol/L (ref 3.5–5.3)
Sodium: 141 mmol/L (ref 135–146)
Total Protein: 6.3 g/dL (ref 6.1–8.1)

## 2017-11-09 LAB — VITAMIN B12: VITAMIN B 12: 598 pg/mL (ref 200–1100)

## 2017-11-09 LAB — TSH: TSH: 1.05 m[IU]/L (ref 0.40–4.50)

## 2017-11-09 LAB — RHEUMATOID FACTOR: Rhuematoid fact SerPl-aCnc: <14 IU/mL (ref <14)

## 2017-11-09 LAB — CK: Total CK: 37 U/L (ref 29–143)

## 2017-11-09 LAB — ANA: Anti Nuclear Antibody(ANA): NEGATIVE

## 2017-11-13 DIAGNOSIS — M1712 Unilateral primary osteoarthritis, left knee: Secondary | ICD-10-CM | POA: Diagnosis not present

## 2017-11-21 ENCOUNTER — Other Ambulatory Visit: Payer: Self-pay | Admitting: *Deleted

## 2017-11-21 DIAGNOSIS — C9110 Chronic lymphocytic leukemia of B-cell type not having achieved remission: Secondary | ICD-10-CM

## 2017-11-21 MED ORDER — TRAMADOL HCL 50 MG PO TABS: 50.0000 mg | ORAL_TABLET | Freq: Four times a day (QID) | ORAL | 0 refills | Status: DC | PRN

## 2017-11-21 NOTE — Telephone Encounter (Signed)
Received fax requesting refill on Tramadol.   Ok to refill??  Last office visit 11/08/2017.  Last refill 09/28/2017.

## 2017-11-28 ENCOUNTER — Encounter: Payer: Self-pay | Admitting: Family Medicine

## 2017-11-28 ENCOUNTER — Ambulatory Visit (INDEPENDENT_AMBULATORY_CARE_PROVIDER_SITE_OTHER): Payer: Medicare Other | Admitting: Family Medicine

## 2017-11-28 VITALS — BP 156/100 | HR 78 | Temp 98.0°F | Resp 18 | Ht 63.5 in | Wt 179.0 lb

## 2017-11-28 DIAGNOSIS — M170 Bilateral primary osteoarthritis of knee: Secondary | ICD-10-CM

## 2017-11-28 DIAGNOSIS — M7989 Other specified soft tissue disorders: Secondary | ICD-10-CM | POA: Diagnosis not present

## 2017-11-28 NOTE — Progress Notes (Signed)
Subjective:    Patient ID: Kimberly King, female    DOB: 1951/05/22, 67 y.o.   MRN: [id number]  HPI  Patient has a past medical history of CLL not currently on treatment.  Over the last week, she has developed edema in the left leg distal to the knee.  She has +1 pitting edema in the left leg compared to only minimal or trace pitting edema in the right leg.  She also complains of pain in her left calf.  She complains of pain radiating down her left leg from her calf towards her foot.  She had surgery in October but no recent surgery.  She denies any recent immobilization.  She denies any chest pain or shortness of breath.  Her weight is essentially unchanged from 1 month ago.  Calf circumference is 39-1/2 cm in the right leg.  It is 41-1/2 cm in the left leg.  There are numerous varicose veins in both legs and signs of venous insufficiency possibly explaining some of the edema.  She also is reporting severe pain in her left knee and therefore she has not been using her left leg quite as much which may cause increased dependent edema as well however obviously a DVT is my concern.  She is also requesting a referral to a different orthopedist for a second opinion.  She has seen [facility] orthopedics.  She has had meniscal repair in the left knee.  She is undergone 2 separate rounds of Visco supplementation injections.  They are now recommending a partial knee replacement.  She would like a second opinion prior to proceeding with surgery Past Medical History:  Diagnosis Date  . Allergy   . Anxiety   . Arthritis    knees hips neck  . Cancer [facility])    leukemia dx 06/2013 - no current treatment - being monitored  . Cataract    bilateral - being monitored, no treatment currently  . Depression   . GERD (gastroesophageal reflux disease)   . Leukocytosis    and low blood sodium   . Migraines   . Smoker    Past Surgical History:  Procedure Laterality Date  . APPENDECTOMY    . BLADDER SURGERY     tack  . CERVICAL FUSION     x2  -  C5-C6  . COLONOSCOPY  2014   pyrtle - hx polyps  . DILATION AND CURETTAGE OF UTERUS     x 2  . TONSILLECTOMY     Current Outpatient Medications on File Prior to Visit  Medication Sig Dispense Refill  . Calcium Carbonate-Vit D-Min (CALCIUM 1200 PO) Take by mouth.    . clobetasol ointment (TEMOVATE) 3.61 % Apply 1 application topically 2 (two) times a week. 30 g 2  . conjugated estrogens (PREMARIN) vaginal cream Place 1 Applicatorful vaginally daily. 42.5 g 12  . diclofenac (VOLTAREN) 75 MG EC tablet Take 75 mg by mouth 2 (two) times daily.  3  . glucosamine-chondroitin 500-400 MG tablet Take 2 tablets by mouth 2 (two) times daily.    Marland Kitchen lidocaine (LIDODERM) 5 %     . Lifitegrast (XIIDRA) 5 % SOLN Apply to eye.    . Omega-3 Fatty Acids (FISH OIL) 1000 MG CAPS Take 1 capsule by mouth 2 (two) times daily.    . pantoprazole (PROTONIX) 40 MG tablet Take 1 tablet (40 mg total) by mouth daily. 90 tablet 3  . SUMAtriptan (IMITREX) 50 MG tablet Take 50 mg by mouth every 2 (  two) hours as needed for migraine. Reported on 04/29/2016    . traMADol (ULTRAM) 50 MG tablet Take 1 tablet (50 mg total) by mouth every 6 (six) hours as needed. Reported on 12/24/2015 120 tablet 0   No current facility-administered medications on file prior to visit.    No Known Allergies Social History   Socioeconomic History  . Marital status: Divorced    Spouse name: Not on file  . Number of children: Not on file  . Years of education: Not on file  . Highest education level: Not on file  Social Needs  . Financial resource strain: Not on file  . Food insecurity - worry: Not on file  . Food insecurity - inability: Not on file  . Transportation needs - medical: Not on file  . Transportation needs - non-medical: Not on file  Occupational History  . Not on file  Tobacco Use  . Smoking status: Former Smoker    Packs/day: 0.25    Years: 30.00    Pack years: 7.50    Types: Cigarettes     Last attempt to quit: 04/22/2015    Years since quitting: 2.6  . Smokeless tobacco: Never Used  Substance and Sexual Activity  . Alcohol use: Yes    Comment: Occasional  . Drug use: No  . Sexual activity: Not on file  Other Topics Concern  . Not on file  Social History Narrative  . Not on file     Review of Systems  All other systems reviewed and are negative.      Objective:   Physical Exam  Constitutional: She appears well-developed and well-nourished. No distress.  Neck: No JVD present.  Cardiovascular: Normal rate, regular rhythm and normal heart sounds.  No murmur heard. Pulmonary/Chest: Effort normal and breath sounds normal. No respiratory distress. She has no wheezes. She has no rales.  Abdominal: Soft. Bowel sounds are normal. She exhibits no distension. There is no tenderness. There is no rebound.  Musculoskeletal: She exhibits edema.       Right knee: She exhibits decreased range of motion. Tenderness found. Medial joint line and lateral joint line tenderness noted.       Left knee: She exhibits decreased range of motion. Tenderness found. Medial joint line and lateral joint line tenderness noted.  Skin: She is not diaphoretic.  Vitals reviewed.         Assessment & Plan:  Left leg swelling - Plan: US Venous Img Lower Unilateral Left  Osteoarthritis of both knees, unspecified osteoarthritis type - Plan: Ambulatory referral to Orthopedic Surgery  I believe the most likely explanation for her left leg swelling is a combination of chronic venous insufficiency coupled with increased dependent edema because of the worsening osteoarthritis in her left knee.  However the patient certainly has risk factors for a DVT and I cannot exclude that today simply with exam.  Therefore I have recommended a venous ultrasound to rule out a DVT.  Patient is in agreement.  However she refuses/is unable to go for an ultrasound today.  Therefore we discussed empiric treatment with  Xarelto 15 mg twice daily until we have a negative ultrasound which we will scheduled for tomorrow.  We discussed the risk including increased risk of bleeding versus the risk of a possible untreated DVT.  Patient elects to take Xarelto 15 mg twice daily until I am able to schedule the venous ultrasound for DVT rule out since she is unable to go for the ultrasound this  afternoon.  We will schedule her first thing in the morning

## 2017-11-28 NOTE — Addendum Note (Signed)
Addended by: Vonna Kotyk A on: 11/28/2017 04:13 PM   Modules accepted: Orders

## 2017-11-29 ENCOUNTER — Ambulatory Visit (HOSPITAL_COMMUNITY)
Admission: RE | Admit: 2017-11-29 | Discharge: 2017-11-29 | Disposition: A | Discharge status: Home / Self Care | Payer: Medicare Other | Source: Ambulatory Visit | Attending: Family Medicine | Admitting: Family Medicine

## 2017-11-29 DIAGNOSIS — M7989 Other specified soft tissue disorders: Secondary | ICD-10-CM | POA: Diagnosis not present

## 2017-11-29 DIAGNOSIS — R9389 Abnormal findings on diagnostic imaging of other specified body structures: Secondary | ICD-10-CM | POA: Diagnosis not present

## 2017-11-29 NOTE — Progress Notes (Signed)
*  Preliminary Results* Left lower extremity venous duplex completed. Left lower extremity is negative for deep vein thrombosis. There is a heterogenous area in the left popliteal fossa, suggestive of possible complex Baker's cyst versus hematoma.   11/29/2017 1:34 PM  Maudry Mayhew, BS, RVT, RDCS, RDMS

## 2017-12-05 ENCOUNTER — Ambulatory Visit (INDEPENDENT_AMBULATORY_CARE_PROVIDER_SITE_OTHER): Payer: Medicare Other

## 2017-12-05 ENCOUNTER — Encounter (INDEPENDENT_AMBULATORY_CARE_PROVIDER_SITE_OTHER): Payer: Self-pay | Admitting: Orthopaedic Surgery

## 2017-12-05 ENCOUNTER — Ambulatory Visit (INDEPENDENT_AMBULATORY_CARE_PROVIDER_SITE_OTHER): Payer: Medicare Other | Admitting: Orthopaedic Surgery

## 2017-12-05 DIAGNOSIS — M1611 Unilateral primary osteoarthritis, right hip: Secondary | ICD-10-CM

## 2017-12-05 DIAGNOSIS — M87051 Idiopathic aseptic necrosis of right femur: Secondary | ICD-10-CM | POA: Insufficient documentation

## 2017-12-05 DIAGNOSIS — M170 Bilateral primary osteoarthritis of knee: Secondary | ICD-10-CM

## 2017-12-05 NOTE — Progress Notes (Addendum)
Office Visit Note   Patient: Kimberly King           Date of Birth: 1951/09/28           MRN: [id number] Visit Date: 12/05/2017              Requested by: Susy Frizzle, MD [address] [facility] [place], [place] [address] PCP: Alycia Rossetti, MD   Assessment & Plan: Visit Diagnoses:  1. Primary localized osteoarthritis of knees, bilateral   2. Primary localized osteoarthritis of right hip     Plan: In regards to the knees, we have discussed operative intervention to include a total knee replacement.  She will would like to think about this.  In the meantime we will give her a medial compartment unloading brace.  In regards to the right hip, we will set her up for an intra-articular cortisone injection with Dr. Ernestina Patches.  She will follow-up with [place] on as-needed basis.  Follow-Up Instructions: Return if symptoms worsen or fail to improve.   Orders:  Orders Placed This Encounter  Procedures  . XR KNEE 3 VIEW LEFT  . XR KNEE 3 VIEW RIGHT  . XR HIP UNILAT W OR W/O PELVIS 2-3 VIEWS RIGHT   No orders of the defined types were placed in this encounter.     Procedures: No procedures performed   Clinical Data: No additional findings.   Subjective: Chief Complaint  Patient presents with  . Left Knee - Pain  . Right Knee - Pain  . Right Hip - Pain    HPI Ms. Kimberly King is a pleasant 67 year old new patient who presents to our clinic today with bilateral knee pain left greater than right as well as right hip pain.  In regards to the knees, she has had pain for several years.  Recently worsened.  She was seen by Dr. Theda Sers at [facility] back in October where she underwent a left knee debriding arthroscopy and meniscectomy.  She notes that her pain has minimally improved since the surgical intervention.  All of her pain is medial aspect.  She describes this as a constant ache worse at the end of the day.  No mechanical symptoms.  She is tried intra-articular cortisone  injections as well as 2 courses of Visco supplementation injections all without relief of symptoms.  She comes in today for second opinion as she has been told that she needs a partial knee replacement.  In regards to the right hip, this is been bothering her for the past 5 years.  All of her pain is to the groin.  She is been having intra-articular cortisone injections by Dr. Dossie Der at [facility] with great relief of symptoms.  Her last injection was this past summer and is recently started to wear off.  Review of Systems as detailed in HPI.  All others reviewed and are negative.   Objective: Vital Signs: There were no vitals taken for this visit.  Physical Exam well-developed well-nourished female no acute distress.  Alert and oriented x3.  Ortho Exam examination of both knees reveals a trace effusion on the left.  No effusion on the right.  Range of motion from 0-125 degrees.  Medial and lateral joint line tenderness on the left with mild patellofemoral crepitus.  She has lateral joint line tenderness on the right.  She does have mild laxity on exam.  Specialty Comments:  No specialty comments available.  Imaging: Xr Hip Unilat W Or  W/o Pelvis 2-3 Views Right  Result Date: 12/05/2017 X-rays of the right hip reveal moderate joint space narrowing  Xr Knee 3 View Left  Result Date: 12/05/2017 X-rays of the left knee reveal bone-on-bone medial compartment  Xr Knee 3 View Right  Result Date: 12/05/2017 X-rays of the right knee reveal 50% joint space narrowing medial compartment    PMFS History: Patient Active Problem List   Diagnosis Date Noted  . Primary localized osteoarthritis of knees, bilateral 12/05/2017  . Primary localized osteoarthritis of right hip 12/05/2017  . Migraines 07/01/2017  . Chronic neck and back pain 07/01/2017  . Thyroid nodule 06/30/2017  . GERD (gastroesophageal reflux disease) 06/30/2017  . Vaginal atrophy 06/30/2017  . Subclinical hyperthyroidism  12/06/2016  . CLL (chronic lymphocytic leukemia) ([place]) 12/17/2013  . COLONIC POLYPS, ADENOMATOUS 10/26/2007   Past Medical History:  Diagnosis Date  . Allergy   . Anxiety   . Arthritis    knees hips neck  . Cancer [facility])    leukemia dx 06/2013 - no current treatment - being monitored  . Cataract    bilateral - being monitored, no treatment currently  . Depression   . GERD (gastroesophageal reflux disease)   . Leukocytosis    and low blood sodium   . Migraines   . Smoker     Family History  Problem Relation Age of Onset  . Colon cancer Paternal Grandmother 35  . Esophageal cancer Paternal Uncle   . Arthritis Mother   . Heart disease Father   . Lung cancer Father   . Lung cancer Maternal Grandfather   . Breast cancer Maternal Grandmother   . Cirrhosis Brother   . Rectal cancer Neg Hx   . Stomach cancer Neg Hx     Past Surgical History:  Procedure Laterality Date  . APPENDECTOMY    . BLADDER SURGERY     tack  . CERVICAL FUSION     x2  -  C5-C6  . COLONOSCOPY  2014   pyrtle - hx polyps  . DILATION AND CURETTAGE OF UTERUS     x 2  . TONSILLECTOMY     Social History   Occupational History  . Not on file  Tobacco Use  . Smoking status: Former Smoker    Packs/day: 0.25    Years: 30.00    Pack years: 7.50    Types: Cigarettes    Last attempt to quit: 04/22/2015    Years since quitting: 2.6  . Smokeless tobacco: Never Used  Substance and Sexual Activity  . Alcohol use: Yes    Comment: Occasional  . Drug use: No  . Sexual activity: Not on file

## 2017-12-13 DIAGNOSIS — M546 Pain in thoracic spine: Secondary | ICD-10-CM | POA: Diagnosis not present

## 2017-12-13 DIAGNOSIS — M9901 Segmental and somatic dysfunction of cervical region: Secondary | ICD-10-CM | POA: Diagnosis not present

## 2017-12-13 DIAGNOSIS — M9903 Segmental and somatic dysfunction of lumbar region: Secondary | ICD-10-CM | POA: Diagnosis not present

## 2017-12-13 DIAGNOSIS — M9902 Segmental and somatic dysfunction of thoracic region: Secondary | ICD-10-CM | POA: Diagnosis not present

## 2017-12-13 DIAGNOSIS — M47812 Spondylosis without myelopathy or radiculopathy, cervical region: Secondary | ICD-10-CM | POA: Diagnosis not present

## 2017-12-13 DIAGNOSIS — M47816 Spondylosis without myelopathy or radiculopathy, lumbar region: Secondary | ICD-10-CM | POA: Diagnosis not present

## 2017-12-21 ENCOUNTER — Ambulatory Visit (INDEPENDENT_AMBULATORY_CARE_PROVIDER_SITE_OTHER): Payer: Medicare Other | Admitting: Physical Medicine and Rehabilitation

## 2017-12-21 ENCOUNTER — Ambulatory Visit (INDEPENDENT_AMBULATORY_CARE_PROVIDER_SITE_OTHER): Payer: Medicare Other

## 2017-12-21 ENCOUNTER — Encounter (INDEPENDENT_AMBULATORY_CARE_PROVIDER_SITE_OTHER): Payer: Self-pay | Admitting: Physical Medicine and Rehabilitation

## 2017-12-21 DIAGNOSIS — M25551 Pain in right hip: Secondary | ICD-10-CM

## 2017-12-21 NOTE — Progress Notes (Signed)
 .  Numeric Pain Rating Scale and Functional Assessment Average Pain 7   In the last MONTH (on 0-10 scale) has pain interfered with the following?  1. General activity like being  able to carry out your everyday physical activities such as walking, climbing stairs, carrying groceries, or moving a chair?  Rating(4)   +Driver, -BT, -Dye Allergies.  

## 2017-12-21 NOTE — Progress Notes (Signed)
Kimberly King - 67 y.o. female MRN [id number]  Date of birth: [birth year]  Office Visit Note: Visit Date: 12/21/2017 PCP: Alycia Rossetti, MD Referred by: Alycia Rossetti, MD  Subjective: Chief Complaint  Patient presents with  . Right Hip - Pain   HPI: Kimberly King is a 67 year old female followed by Dr. Erlinda Hong and his physician assistant, Kimberly Cooler, PA-C.  She comes in today for planned anesthetic hip arthrogram which would be diagnostic and hopefully therapeutic.  She has known right hip osteoarthritis.   ROS Otherwise per HPI.  Assessment & Plan: Visit Diagnoses:  1. Pain in right hip     Plan: Findings:  Diagnostic and of the therapeutic right hip anesthetic arthrogram.  Patient did have relief during the anesthetic phase of the injection.    Meds & Orders: No orders of the defined types were placed in this encounter.   Orders Placed This Encounter  Procedures  . Large Joint Inj: R hip joint  . XR C-ARM NO REPORT    Follow-up: Return if symptoms worsen or fail to improve, for Dr. Erlinda Hong.   Procedures: Large Joint Inj: R hip joint on 12/21/2017 2:38 PM Indications: pain and diagnostic evaluation Details: 22 G needle, anterior approach  Arthrogram: Yes  Medications: 80 mg triamcinolone acetonide 40 MG/ML; 3 mL bupivacaine 0.5 % Outcome: tolerated well, no immediate complications  Arthrogram demonstrated excellent flow of contrast throughout the joint surface without extravasation or obvious defect.  The patient had relief of symptoms during the anesthetic phase of the injection.  Procedure, treatment alternatives, risks and benefits explained, specific risks discussed. Consent was given by the patient. Immediately prior to procedure a time out was called to verify the correct patient, procedure, equipment, support staff and site/side marked as required. Patient was prepped and draped in the usual sterile fashion.      No notes on file   Clinical History: No  specialty comments available.   She reports that she quit smoking about 2 years ago. Her smoking use included cigarettes. She has a 7.50 pack-year smoking history. She has never used smokeless tobacco. No results for input(s): HGBA1C, LABURIC in the last 8760 hours.  Objective:  VS:  HT:    WT:   BMI:     BP:   HR: bpm  TEMP: ( )  RESP:  Physical Exam  Musculoskeletal:  Painful range of motion of the right hip.    Ortho Exam Imaging: No results found.  Past Medical/Family/Surgical/Social History: Medications & Allergies reviewed per EMR, new medications updated. Patient Active Problem List   Diagnosis Date Noted  . Obesity (BMI 30-39.9) 12/31/2017  . Primary localized osteoarthritis of knees, bilateral 12/05/2017  . Primary localized osteoarthritis of right hip 12/05/2017  . Migraines 07/01/2017  . Chronic neck and back pain 07/01/2017  . Thyroid nodule 06/30/2017  . GERD (gastroesophageal reflux disease) 06/30/2017  . Vaginal atrophy 06/30/2017  . Subclinical hyperthyroidism 12/06/2016  . CLL (chronic lymphocytic leukemia) ([place]) 12/17/2013  . COLONIC POLYPS, ADENOMATOUS 10/26/2007   Past Medical History:  Diagnosis Date  . Allergy   . Anxiety   . Arthritis    knees hips neck  . Cancer [facility])    leukemia dx 06/2013 - no current treatment - being monitored  . Cataract    bilateral - being monitored, no treatment currently  . Depression   . GERD (gastroesophageal reflux disease)   . Leukocytosis    and low blood sodium   .  Migraines   . Smoker    Family History  Problem Relation Age of Onset  . Colon cancer Paternal Grandmother 61  . Esophageal cancer Paternal Uncle   . Arthritis Mother   . Heart disease Father   . Lung cancer Father   . Lung cancer Maternal Grandfather   . Breast cancer Maternal Grandmother   . Cirrhosis Brother   . Rectal cancer Neg Hx   . Stomach cancer Neg Hx    Past Surgical History:  Procedure Laterality Date  . APPENDECTOMY    .  BLADDER SURGERY     tack  . CERVICAL FUSION     x2  -  C5-C6  . COLONOSCOPY  2014   pyrtle - hx polyps  . DILATION AND CURETTAGE OF UTERUS     x 2  . TONSILLECTOMY     Social History   Occupational History  . Not on file  Tobacco Use  . Smoking status: Former Smoker    Packs/day: 0.25    Years: 30.00    Pack years: 7.50    Types: Cigarettes    Last attempt to quit: 04/22/2015    Years since quitting: 2.7  . Smokeless tobacco: Never Used  Substance and Sexual Activity  . Alcohol use: Yes    Comment: Occasional  . Drug use: No  . Sexual activity: Not on file

## 2017-12-21 NOTE — Patient Instructions (Signed)

## 2017-12-25 ENCOUNTER — Telehealth: Payer: Self-pay | Admitting: Internal Medicine

## 2017-12-25 ENCOUNTER — Inpatient Hospital Stay: Payer: Medicare Other | Attending: Internal Medicine | Admitting: Internal Medicine

## 2017-12-25 ENCOUNTER — Encounter: Payer: Self-pay | Admitting: Internal Medicine

## 2017-12-25 ENCOUNTER — Inpatient Hospital Stay: Payer: Medicare Other

## 2017-12-25 VITALS — BP 157/88 | HR 80 | Temp 98.1°F | Resp 16 | Ht 63.5 in | Wt 178.3 lb

## 2017-12-25 DIAGNOSIS — F419 Anxiety disorder, unspecified: Secondary | ICD-10-CM | POA: Diagnosis not present

## 2017-12-25 DIAGNOSIS — F329 Major depressive disorder, single episode, unspecified: Secondary | ICD-10-CM | POA: Insufficient documentation

## 2017-12-25 DIAGNOSIS — Z79899 Other long term (current) drug therapy: Secondary | ICD-10-CM | POA: Insufficient documentation

## 2017-12-25 DIAGNOSIS — C9110 Chronic lymphocytic leukemia of B-cell type not having achieved remission: Secondary | ICD-10-CM

## 2017-12-25 DIAGNOSIS — K219 Gastro-esophageal reflux disease without esophagitis: Secondary | ICD-10-CM | POA: Diagnosis not present

## 2017-12-25 LAB — CBC WITH DIFFERENTIAL/PLATELET
BASOS ABS: 0 10*3/uL (ref 0.0–0.1)
Basophils Relative: 0 %
Eosinophils Absolute: 0.1 10*3/uL (ref 0.0–0.5)
Eosinophils Relative: 1 %
HCT: 38.4 % (ref 34.8–46.6)
HEMOGLOBIN: 12.2 g/dL (ref 11.6–15.9)
LYMPHS ABS: 11 10*3/uL — AB (ref 0.9–3.3)
LYMPHS PCT: 52 %
MCH: 28 pg (ref 25.1–34.0)
MCHC: 31.8 g/dL (ref 31.5–36.0)
MCV: 88.1 fL (ref 79.5–101.0)
Monocytes Absolute: 1.3 10*3/uL — ABNORMAL HIGH (ref 0.1–0.9)
Monocytes Relative: 6 %
NEUTROS PCT: 41 %
Neutro Abs: 8.7 10*3/uL — ABNORMAL HIGH (ref 1.5–6.5)
Platelets: 416 10*3/uL — ABNORMAL HIGH (ref 145–400)
RBC: 4.36 MIL/uL (ref 3.70–5.45)
RDW: 15 % — ABNORMAL HIGH (ref 11.2–14.5)
WBC: 21.1 10*3/uL — AB (ref 3.9–10.3)

## 2017-12-25 LAB — COMPREHENSIVE METABOLIC PANEL
ALK PHOS: 98 U/L (ref 40–150)
ALT: 32 U/L (ref 0–55)
AST: 18 U/L (ref 5–34)
Albumin: 4.3 g/dL (ref 3.5–5.0)
Anion gap: 8 (ref 3–11)
BUN: 28 mg/dL — AB (ref 7–26)
CALCIUM: 10.2 mg/dL (ref 8.4–10.4)
CO2: 27 mmol/L (ref 22–29)
CREATININE: 0.87 mg/dL (ref 0.60–1.10)
Chloride: 104 mmol/L (ref 98–109)
Glucose, Bld: 101 mg/dL (ref 70–140)
Potassium: 3.6 mmol/L (ref 3.5–5.1)
SODIUM: 139 mmol/L (ref 136–145)
Total Bilirubin: 0.3 mg/dL (ref 0.2–1.2)
Total Protein: 7.5 g/dL (ref 6.4–8.3)

## 2017-12-25 LAB — LACTATE DEHYDROGENASE: LDH: 149 U/L (ref 125–245)

## 2017-12-25 NOTE — Progress Notes (Signed)
[place] [phone]   [phone]  OFFICE PROGRESS NOTE  Alycia Rossetti, MD [address] [place] [address]  DIAGNOSIS: Stage 0 chronic lymphocytic leukemia  PRIOR THERAPY: None  CURRENT THERAPY: Observation.  INTERVAL HISTORY: Kimberly King 67 y.o. female returns to the clinic today for follow-up visit.  The patient has no complaints today except for arthralgia on the right knee.  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  She denied having any current weight loss or night sweats.  She has no nausea, vomiting, diarrhea or constipation.  She has no fever or chills.  She had repeat CBC performed earlier today and she is here for evaluation and discussion of her lab results.  MEDICAL HISTORY: Past Medical History:  Diagnosis Date  . Allergy   . Anxiety   . Arthritis    knees hips neck  . Cancer [facility])    leukemia dx 06/2013 - no current treatment - being monitored  . Cataract    bilateral - being monitored, no treatment currently  . Depression   . GERD (gastroesophageal reflux disease)   . Leukocytosis    and low blood sodium   . Migraines   . Smoker     ALLERGIES:  has No Known Allergies.  MEDICATIONS:  Current Outpatient Medications  Medication Sig Dispense Refill  . Calcium Carbonate-Vit D-Min (CALCIUM 1200 PO) Take by mouth.    . clobetasol ointment (TEMOVATE) 1.19 % Apply 1 application topically 2 (two) times a week. 30 g 2  . conjugated estrogens (PREMARIN) vaginal cream Place 1 Applicatorful vaginally daily. 42.5 g 12  . DENTA 5000 PLUS 1.1 % CREA dental cream Place 1 application onto teeth at bedtime. brush teeth for 2 minutes and spit out do not rinse  2  . diclofenac (VOLTAREN) 75 MG EC tablet Take 75 mg by mouth 2 (two) times daily.  3  . glucosamine-chondroitin 500-400 MG tablet  Take 2 tablets by mouth 2 (two) times daily.    Marland Kitchen lidocaine (LIDODERM) 5 %     . Lifitegrast (XIIDRA) 5 % SOLN Apply to eye.    . Omega-3 Fatty Acids (FISH OIL) 1000 MG CAPS Take 1 capsule by mouth 2 (two) times daily.    . pantoprazole (PROTONIX) 40 MG tablet Take 1 tablet (40 mg total) by mouth daily. 90 tablet 3  . SUMAtriptan (IMITREX) 50 MG tablet Take 50 mg by mouth every 2 (two) hours as needed for migraine. Reported on 04/29/2016    . traMADol (ULTRAM) 50 MG tablet Take 1 tablet (50 mg total) by mouth every 6 (six) hours as needed. Reported on 12/24/2015 120 tablet 0  . diclofenac (VOLTAREN) 75 MG EC tablet Take 1 tablet by mouth daily.     No current facility-administered medications for this visit.     SURGICAL HISTORY:  Past Surgical History:  Procedure Laterality Date  . APPENDECTOMY    . BLADDER SURGERY     tack  . CERVICAL FUSION     x2  -  C5-C6  . COLONOSCOPY  2014   pyrtle - hx polyps  . DILATION AND CURETTAGE OF UTERUS     x 2  . TONSILLECTOMY      REVIEW OF SYSTEMS:  A comprehensive review of systems was negative except for: Musculoskeletal: positive  for arthralgias   PHYSICAL EXAMINATION: General appearance: alert, cooperative and no distress Head: Normocephalic, without obvious abnormality, atraumatic Neck: no adenopathy, no JVD, supple, symmetrical, trachea midline and thyroid not enlarged, symmetric, no tenderness/mass/nodules Lymph nodes: Cervical, supraclavicular, and axillary nodes normal. Resp: clear to auscultation bilaterally Back: symmetric, no curvature. ROM normal. No CVA tenderness. Cardio: regular rate and rhythm, S1, S2 normal, no murmur, click, rub or gallop GI: soft, non-tender; bowel sounds normal; no masses,  no organomegaly Extremities: extremities normal, atraumatic, no cyanosis or edema  ECOG PERFORMANCE STATUS: 0 - Asymptomatic  Blood pressure (!) 157/88, pulse 80, temperature 98.1 F (36.7 C), temperature source Oral, resp. rate 16,  height 5' 3.5" (1.613 m), weight 178 lb 4.8 oz (80.9 kg), SpO2 97 %.  LABORATORY DATA: Lab Results  Component Value Date   WBC 21.1 (H) 12/25/2017   HGB 12.2 12/25/2017   HCT 38.4 12/25/2017   MCV 88.1 12/25/2017   PLT 416 (H) 12/25/2017      Chemistry      Component Value Date/Time   NA 141 11/08/2017 1309   NA 139 06/27/2017 1324   K 4.3 11/08/2017 1309   K 3.5 06/27/2017 1324   CL 104 11/08/2017 1309   CO2 26 11/08/2017 1309   CO2 26 06/27/2017 1324   BUN 22 11/08/2017 1309   BUN 22.4 06/27/2017 1324   CREATININE 0.65 11/08/2017 1309   CREATININE 0.8 06/27/2017 1324      Component Value Date/Time   CALCIUM 9.6 11/08/2017 1309   CALCIUM 9.7 06/27/2017 1324   ALKPHOS 87 06/27/2017 1324   AST 19 11/08/2017 1309   AST 21 06/27/2017 1324   ALT 29 11/08/2017 1309   ALT 32 06/27/2017 1324   BILITOT 0.3 11/08/2017 1309   BILITOT 0.32 06/27/2017 1324       RADIOGRAPHIC STUDIES: No results found.  ASSESSMENT AND PLAN:  This is a very pleasant 67 years old white female with a stage 0 chronic lymphocytic leukemia and currently on observation. The patient is doing fine today with no concerning complaint except for arthralgia. CBC today showed no significant increase in her total white blood count. I recommended for her to continue on observation with repeat CBC and LDH in 6 months. The patient was advised to call immediately if she has any concerning symptoms in the interval. The patient voices understanding of current disease status and treatment options and is in agreement with the current care plan. All questions were answered. The patient knows to call the clinic with any problems, questions or concerns. We can certainly see the patient much sooner if necessary. I spent 10 minutes counseling the patient face to face. The total time spent in the appointment was 15 minutes.  Disclaimer: This note was dictated with voice recognition software. Similar sounding words can  inadvertently be transcribed and may not be corrected upon review.

## 2017-12-25 NOTE — Telephone Encounter (Signed)
Appointments scheduled patient declined AVS/Calendar per 3/18 los

## 2017-12-29 ENCOUNTER — Other Ambulatory Visit: Payer: Self-pay

## 2017-12-29 ENCOUNTER — Ambulatory Visit (INDEPENDENT_AMBULATORY_CARE_PROVIDER_SITE_OTHER): Payer: Medicare Other | Admitting: Family Medicine

## 2017-12-29 ENCOUNTER — Encounter: Payer: Self-pay | Admitting: Family Medicine

## 2017-12-29 VITALS — BP 128/68 | HR 58 | Temp 98.0°F | Resp 14 | Ht 63.5 in | Wt 178.0 lb

## 2017-12-29 DIAGNOSIS — R0789 Other chest pain: Secondary | ICD-10-CM

## 2017-12-29 DIAGNOSIS — K219 Gastro-esophageal reflux disease without esophagitis: Secondary | ICD-10-CM | POA: Diagnosis not present

## 2017-12-29 DIAGNOSIS — Z13220 Encounter for screening for lipoid disorders: Secondary | ICD-10-CM

## 2017-12-29 DIAGNOSIS — E669 Obesity, unspecified: Secondary | ICD-10-CM | POA: Diagnosis not present

## 2017-12-29 DIAGNOSIS — G43709 Chronic migraine without aura, not intractable, without status migrainosus: Secondary | ICD-10-CM

## 2017-12-29 MED ORDER — RANITIDINE HCL 150 MG PO TABS: 150.0000 mg | ORAL_TABLET | Freq: Every day | ORAL | 2 refills | Status: DC

## 2017-12-29 NOTE — Progress Notes (Signed)
   Subjective:    Patient ID: Kimberly King, female    DOB: 08/06/1951, 67 y.o.   MRN: [id number]  Patient presents for Follow-up (is not fasting)   OA of left knee, had swelling in back of leg and calf pain on and off, has seen by Dr. Erlinda Hong, prescribed a Brace   Bone Spur - had injection,using Ultram,and diclofenac once a day    GERD- getting worse , using protonix, has decreaesd her NSAID use,    Migraines has not had any recently, does not use imitex    Burning sensation on left chest wall, near axilla , no pain with ROM  , no SOB   Mammogram normal  Review Of Systems:  GEN- denies fatigue, fever, weight loss,weakness, recent illness HEENT- denies eye drainage, change in vision, nasal discharge, CVS- denies chest pain, palpitations RESP- denies SOB, cough, wheeze ABD- denies N/V, change in stools, abd pain GU- denies dysuria, hematuria, dribbling, incontinence MSK-+ joint pain, muscle aches, injury Neuro- denies headache, dizziness, syncope, seizure activity       Objective:    BP 128/68   Pulse (!) 58   Temp 98 F (36.7 C) (Oral)   Resp 14   Ht 5' 3.5" (1.613 m)   Wt 178 lb (80.7 kg)   SpO2 98%   BMI 31.04 kg/m  GEN- NAD, alert and oriented x3 HEENT- PERRL, EOMI, non injected sclera, pink conjunctiva, MMM, oropharynx clear Neck- Supple, no thyromegaly CVS- RRR, no murmur Chest -TTP left upper chest wall, near axilla no mass or nodule palpated MSK- FROM upper ext, shoulder  RESP-CTAB ABD-NABS,soft,NT,ND EXT- No edema Pulses- Radial  2+  EKG-NSR, no ST changes       Assessment & Plan:      Problem List Items Addressed This Visit      Unprioritized   Obesity (BMI 30-39.9)   Migraines    Rare use of imitrex      GERD (gastroesophageal reflux disease)    Add zantac  150mg  at bedtime Continue protonix in the morning  Her chest pain is atypical, more MSK point tenderness, good ROM of the shoulder itself, EKG unremarkable. Advised she can have checked  by her ortho as well, to make sure we are not missing anything.  Her recent mammogram also normal       Relevant Medications   ranitidine (ZANTAC) 150 MG tablet    Other Visit Diagnoses    Atypical chest pain    -  Primary   Relevant Orders   EKG 12-Lead (Completed)   Screening cholesterol level       Relevant Orders   Lipid panel      Note: This dictation was prepared with Dragon dictation along with smaller phrase technology. Any transcriptional errors that result from this process are unintentional.

## 2017-12-29 NOTE — Patient Instructions (Addendum)
Zantac 150mg  at bedtime for reflux  Return for fasting labs  F/U 6 months for Physical

## 2017-12-31 ENCOUNTER — Encounter: Payer: Self-pay | Admitting: Family Medicine

## 2017-12-31 DIAGNOSIS — E669 Obesity, unspecified: Secondary | ICD-10-CM | POA: Insufficient documentation

## 2017-12-31 NOTE — Assessment & Plan Note (Signed)
Add zantac  150mg  at bedtime Continue protonix in the morning  Her chest pain is atypical, more MSK point tenderness, good ROM of the shoulder itself, EKG unremarkable. Advised she can have checked by her ortho as well, to make sure we are not missing anything.  Her recent mammogram also normal

## 2017-12-31 NOTE — Assessment & Plan Note (Signed)
Rare use of imitrex

## 2018-01-01 ENCOUNTER — Other Ambulatory Visit: Payer: Medicare Other

## 2018-01-01 DIAGNOSIS — Z13220 Encounter for screening for lipoid disorders: Secondary | ICD-10-CM | POA: Diagnosis not present

## 2018-01-01 DIAGNOSIS — E785 Hyperlipidemia, unspecified: Secondary | ICD-10-CM | POA: Diagnosis not present

## 2018-01-02 LAB — LIPID PANEL
Cholesterol: 221 mg/dL — ABNORMAL HIGH
HDL: 69 mg/dL
LDL Cholesterol (Calc): 137 mg/dL — ABNORMAL HIGH
Non-HDL Cholesterol (Calc): 152 mg/dL — ABNORMAL HIGH
Total CHOL/HDL Ratio: 3.2 (calc)
Triglycerides: 59 mg/dL

## 2018-01-04 ENCOUNTER — Encounter: Payer: Self-pay | Admitting: *Deleted

## 2018-01-09 DIAGNOSIS — M47812 Spondylosis without myelopathy or radiculopathy, cervical region: Secondary | ICD-10-CM | POA: Diagnosis not present

## 2018-01-09 DIAGNOSIS — M47816 Spondylosis without myelopathy or radiculopathy, lumbar region: Secondary | ICD-10-CM | POA: Diagnosis not present

## 2018-01-09 DIAGNOSIS — M546 Pain in thoracic spine: Secondary | ICD-10-CM | POA: Diagnosis not present

## 2018-01-09 DIAGNOSIS — M9901 Segmental and somatic dysfunction of cervical region: Secondary | ICD-10-CM | POA: Diagnosis not present

## 2018-01-09 DIAGNOSIS — M9902 Segmental and somatic dysfunction of thoracic region: Secondary | ICD-10-CM | POA: Diagnosis not present

## 2018-01-09 DIAGNOSIS — M9903 Segmental and somatic dysfunction of lumbar region: Secondary | ICD-10-CM | POA: Diagnosis not present

## 2018-01-10 MED ORDER — TRIAMCINOLONE ACETONIDE 40 MG/ML IJ SUSP
80.0000 mg | INTRAMUSCULAR | Status: AC | PRN
  Administered 2017-12-21: 80 mg via INTRA_ARTICULAR

## 2018-01-10 MED ORDER — BUPIVACAINE HCL 0.5 % IJ SOLN
3.0000 mL | INTRAMUSCULAR | Status: AC | PRN
  Administered 2017-12-21: 3 mL via INTRA_ARTICULAR

## 2018-01-16 DIAGNOSIS — M542 Cervicalgia: Secondary | ICD-10-CM | POA: Diagnosis not present

## 2018-01-16 DIAGNOSIS — M5030 Other cervical disc degeneration, unspecified cervical region: Secondary | ICD-10-CM | POA: Diagnosis not present

## 2018-01-16 DIAGNOSIS — Z6830 Body mass index (BMI) 30.0-30.9, adult: Secondary | ICD-10-CM | POA: Diagnosis not present

## 2018-01-16 DIAGNOSIS — I10 Essential (primary) hypertension: Secondary | ICD-10-CM | POA: Diagnosis not present

## 2018-01-16 DIAGNOSIS — M4722 Other spondylosis with radiculopathy, cervical region: Secondary | ICD-10-CM | POA: Diagnosis not present

## 2018-01-18 ENCOUNTER — Encounter (INDEPENDENT_AMBULATORY_CARE_PROVIDER_SITE_OTHER): Payer: Self-pay | Admitting: Orthopaedic Surgery

## 2018-01-18 ENCOUNTER — Ambulatory Visit (INDEPENDENT_AMBULATORY_CARE_PROVIDER_SITE_OTHER): Payer: Medicare Other | Admitting: Orthopaedic Surgery

## 2018-01-18 ENCOUNTER — Other Ambulatory Visit: Payer: Self-pay | Admitting: Neurosurgery

## 2018-01-18 DIAGNOSIS — M1712 Unilateral primary osteoarthritis, left knee: Secondary | ICD-10-CM

## 2018-01-18 DIAGNOSIS — M4722 Other spondylosis with radiculopathy, cervical region: Secondary | ICD-10-CM

## 2018-01-18 MED ORDER — METHYLPREDNISOLONE ACETATE 40 MG/ML IJ SUSP
40.0000 mg | INTRAMUSCULAR | Status: AC | PRN
  Administered 2018-01-18: 40 mg via INTRA_ARTICULAR

## 2018-01-18 MED ORDER — LIDOCAINE HCL 1 % IJ SOLN
2.0000 mL | INTRAMUSCULAR | Status: AC | PRN
  Administered 2018-01-18: 2 mL

## 2018-01-18 MED ORDER — BUPIVACAINE HCL 0.5 % IJ SOLN
2.0000 mL | INTRAMUSCULAR | Status: AC | PRN
  Administered 2018-01-18: 2 mL via INTRA_ARTICULAR

## 2018-01-18 NOTE — Progress Notes (Signed)
Office Visit Note   Patient: Kimberly King           Date of Birth: 1951-06-21           MRN: [id number] Visit Date: 01/18/2018              Requested by: Alycia Rossetti, MD [address] [place], [place] [address] PCP: Alycia Rossetti, MD   Assessment & Plan: Visit Diagnoses:  1. Unilateral primary osteoarthritis, left knee     Plan: Impression is left knee degenerative joint disease with reactive effusion.  Aspiration and injection performed today.  Patient has tried 2 rounds of HA injections with minimal relief.  She is considering total knee replacement sometime in the fall of this year.  She just received her medial unloader brace which she is going to try.  We will see her back as needed.  Follow-Up Instructions: Return if symptoms worsen or fail to improve.   Orders:  No orders of the defined types were placed in this encounter.  No orders of the defined types were placed in this encounter.     Procedures: Large Joint Inj: L knee on 01/18/2018 10:24 AM Details: 22 G needle Medications: 2 mL bupivacaine 0.5 %; 2 mL lidocaine 1 %; 40 mg methylPREDNISolone acetate 40 MG/ML Aspirate: 45 mL yellow; sent for lab analysis Outcome: tolerated well, no immediate complications Patient was prepped and draped in the usual sterile fashion.       Clinical Data: No additional findings.   Subjective: Chief Complaint  Patient presents with  . Left Knee - Pain, Follow-up    Patient follows up today for worsening left knee pain.  She does have known bone-on-bone arthritis in the medial compartment.  She does endorse swelling.  She just received her medial unloader brace.   Review of Systems   Objective: Vital Signs: There were no vitals taken for this visit.  Physical Exam  Ortho Exam Left knee exam shows a large joint effusion.  Otherwise unremarkable. Specialty Comments:  No specialty comments available.  Imaging: No results found.   PMFS  History: Patient Active Problem List   Diagnosis Date Noted  . Obesity (BMI 30-39.9) 12/31/2017  . Primary localized osteoarthritis of knees, bilateral 12/05/2017  . Primary localized osteoarthritis of right hip 12/05/2017  . Migraines 07/01/2017  . Chronic neck and back pain 07/01/2017  . Thyroid nodule 06/30/2017  . GERD (gastroesophageal reflux disease) 06/30/2017  . Vaginal atrophy 06/30/2017  . Subclinical hyperthyroidism 12/06/2016  . CLL (chronic lymphocytic leukemia) ([place]) 12/17/2013  . COLONIC POLYPS, ADENOMATOUS 10/26/2007   Past Medical History:  Diagnosis Date  . Allergy   . Anxiety   . Arthritis    knees hips neck  . Cancer [facility])    leukemia dx 06/2013 - no current treatment - being monitored  . Cataract    bilateral - being monitored, no treatment currently  . Depression   . GERD (gastroesophageal reflux disease)   . Leukocytosis    and low blood sodium   . Migraines   . Smoker     Family History  Problem Relation Age of Onset  . Colon cancer Paternal Grandmother 28  . Esophageal cancer Paternal Uncle   . Arthritis Mother   . Heart disease Father   . Lung cancer Father   . Lung cancer Maternal Grandfather   . Breast cancer Maternal Grandmother   . Cirrhosis Brother   . Rectal cancer Neg Hx   .  Stomach cancer Neg Hx     Past Surgical History:  Procedure Laterality Date  . APPENDECTOMY    . BLADDER SURGERY     tack  . CERVICAL FUSION     x2  -  C5-C6  . COLONOSCOPY  2014   pyrtle - hx polyps  . DILATION AND CURETTAGE OF UTERUS     x 2  . TONSILLECTOMY     Social History   Occupational History  . Not on file  Tobacco Use  . Smoking status: Former Smoker    Packs/day: 0.25    Years: 30.00    Pack years: 7.50    Types: Cigarettes    Last attempt to quit: 04/22/2015    Years since quitting: 2.7  . Smokeless tobacco: Never Used  Substance and Sexual Activity  . Alcohol use: Yes    Comment: Occasional  . Drug use: No  . Sexual activity:  Not on file

## 2018-01-19 LAB — TIQ-NTM

## 2018-01-19 LAB — SYNOVIAL CELL COUNT + DIFF, W/ CRYSTALS
BASOPHILS, %: 0 %
Eosinophils-Synovial: 0 % (ref 0–2)
LYMPHOCYTES-SYNOVIAL FLD: 1 % (ref 0–74)
Monocyte/Macrophage: 4 % (ref 0–69)
NEUTROPHIL, SYNOVIAL: 95 % — AB (ref 0–24)
Synoviocytes, %: 0 % (ref 0–15)
WBC, SYNOVIAL: 32240 {cells}/uL — AB (ref <150)

## 2018-01-19 NOTE — Progress Notes (Signed)
No gout or pseudogout.  Looks like inflammatory fluid.  Please let her know and see how she's feeling.  Thanks.

## 2018-01-22 DIAGNOSIS — L821 Other seborrheic keratosis: Secondary | ICD-10-CM | POA: Diagnosis not present

## 2018-01-22 DIAGNOSIS — D2271 Melanocytic nevi of right lower limb, including hip: Secondary | ICD-10-CM | POA: Diagnosis not present

## 2018-01-22 DIAGNOSIS — Z85828 Personal history of other malignant neoplasm of skin: Secondary | ICD-10-CM | POA: Diagnosis not present

## 2018-01-22 DIAGNOSIS — Z85820 Personal history of malignant melanoma of skin: Secondary | ICD-10-CM | POA: Diagnosis not present

## 2018-01-22 DIAGNOSIS — D225 Melanocytic nevi of trunk: Secondary | ICD-10-CM | POA: Diagnosis not present

## 2018-01-22 DIAGNOSIS — Z86018 Personal history of other benign neoplasm: Secondary | ICD-10-CM | POA: Diagnosis not present

## 2018-01-23 ENCOUNTER — Other Ambulatory Visit: Payer: Self-pay | Admitting: Family Medicine

## 2018-01-23 DIAGNOSIS — C9110 Chronic lymphocytic leukemia of B-cell type not having achieved remission: Secondary | ICD-10-CM

## 2018-01-24 NOTE — Telephone Encounter (Signed)
Ok to refill??  Last office visit 12/29/2017.  Last refill 11/21/2017.

## 2018-02-05 ENCOUNTER — Other Ambulatory Visit: Payer: Self-pay | Admitting: Neurosurgery

## 2018-02-05 ENCOUNTER — Ambulatory Visit
Admission: RE | Admit: 2018-02-05 | Discharge: 2018-02-05 | Disposition: A | Discharge status: Home / Self Care | Payer: Medicare Other | Source: Ambulatory Visit | Attending: Neurosurgery | Admitting: Neurosurgery

## 2018-02-05 DIAGNOSIS — M4722 Other spondylosis with radiculopathy, cervical region: Secondary | ICD-10-CM

## 2018-02-05 DIAGNOSIS — M545 Low back pain: Secondary | ICD-10-CM | POA: Diagnosis not present

## 2018-02-05 MED ORDER — IOPAMIDOL (ISOVUE-M 300) INJECTION 61%
1.0000 mL | Freq: Once | INTRAMUSCULAR | Status: AC | PRN
  Administered 2018-02-05: 1 mL via INTRA_ARTICULAR

## 2018-02-05 MED ORDER — DEXAMETHASONE SODIUM PHOSPHATE 4 MG/ML IJ SOLN
4.0000 mg | Freq: Once | INTRAMUSCULAR | Status: AC
  Administered 2018-02-05: 4 mg via INTRA_ARTICULAR

## 2018-03-09 ENCOUNTER — Other Ambulatory Visit (INDEPENDENT_AMBULATORY_CARE_PROVIDER_SITE_OTHER): Payer: Self-pay

## 2018-03-12 DIAGNOSIS — M9903 Segmental and somatic dysfunction of lumbar region: Secondary | ICD-10-CM | POA: Diagnosis not present

## 2018-03-12 DIAGNOSIS — M546 Pain in thoracic spine: Secondary | ICD-10-CM | POA: Diagnosis not present

## 2018-03-12 DIAGNOSIS — M47816 Spondylosis without myelopathy or radiculopathy, lumbar region: Secondary | ICD-10-CM | POA: Diagnosis not present

## 2018-03-12 DIAGNOSIS — M9901 Segmental and somatic dysfunction of cervical region: Secondary | ICD-10-CM | POA: Diagnosis not present

## 2018-03-12 DIAGNOSIS — M47812 Spondylosis without myelopathy or radiculopathy, cervical region: Secondary | ICD-10-CM | POA: Diagnosis not present

## 2018-03-12 DIAGNOSIS — M9902 Segmental and somatic dysfunction of thoracic region: Secondary | ICD-10-CM | POA: Diagnosis not present

## 2018-03-12 NOTE — Pre-Procedure Instructions (Signed)
Kimberly King  03/12/2018      Your procedure is scheduled on March 19, 2018.  Report to [facility] Admitting at 05:30 A.M.  Call this number if you have problems the morning of surgery:  [phone]   Remember:  No food or liquids after midnight.   Continue all other medications as directed by your physician except for following these medication instructions before surgery.    Take these medicines the morning of surgery with A SIP OF WATER : Pantoprazole (Protonix) Ranitidine (Zantac) Tramadol (Ultram) if needed  7 days prior to surgery STOP taking any Aspirin (unless otherwise instructed by your surgeon), Aleve, Naproxen, Ibuprofen, Motrin, Advil, Goody's, BC's, all herbal medications, fish oil, and all vitamins.    Do not wear jewelry, make-up or nail polish.  Do not wear lotions, powders, or perfumes, or deodorant.  Do not shave 48 hours prior to surgery.   Do not bring valuables to the hospital.   [facility] is not responsible for any belongings or valuables.  Contacts, dentures or bridgework may not be worn into surgery.  Leave your suitcase in the car.  After surgery it may be brought to your room.  For patients admitted to the hospital, discharge time will be determined by your treatment team.  Patients discharged the day of surgery will not be allowed to drive home.   Special instructions:   [place]- Preparing For Surgery  Before surgery, you can play an important role. Because skin is not sterile, your skin needs to be as free of germs as possible. You can reduce the number of germs on your skin by washing with CHG (chlorahexidine gluconate) Soap before surgery.  CHG is an antiseptic cleaner which kills germs and bonds with the skin to continue killing germs even after washing.    Oral Hygiene is also important to reduce your risk of infection.  Remember - BRUSH YOUR TEETH THE MORNING OF SURGERY WITH YOUR REGULAR TOOTHPASTE  Please do not use  if you have an allergy to CHG or antibacterial soaps. If your skin becomes reddened/irritated stop using the CHG.  Do not shave (including legs and underarms) for at least 48 hours prior to first CHG shower. It is OK to shave your face.  Please follow these instructions carefully.   1. Shower the NIGHT BEFORE SURGERY and the MORNING OF SURGERY with CHG.   2. If you chose to wash your hair, wash your hair first as usual with your normal shampoo.  3. After you shampoo, rinse your hair and body thoroughly to remove the shampoo.  4. Use CHG as you would any other liquid soap. You can apply CHG directly to the skin and wash gently with a scrungie or a clean washcloth.   5. Apply the CHG Soap to your body ONLY FROM THE NECK DOWN.  Do not use on open wounds or open sores. Avoid contact with your eyes, ears, mouth and genitals (private parts). Wash Face and genitals (private parts)  with your normal soap.  6. Wash thoroughly, paying special attention to the area where your surgery will be performed.  7. Thoroughly rinse your body with warm water from the neck down.  8. DO NOT shower/wash with your normal soap after using and rinsing off the CHG Soap.  9. Pat yourself dry with a CLEAN TOWEL.  10. Wear CLEAN PAJAMAS to bed the night before surgery, wear comfortable clothes the morning of surgery  11. Place  CLEAN SHEETS on your bed the night of your first shower and DO NOT SLEEP WITH PETS.    Day of Surgery:  Do not apply any deodorants/lotions.  Please wear clean clothes to the hospital/surgery center.   Remember to brush your teeth WITH YOUR REGULAR TOOTHPASTE.    Please read over the following fact sheets that you were given.

## 2018-03-13 ENCOUNTER — Ambulatory Visit (HOSPITAL_COMMUNITY)
Admission: RE | Admit: 2018-03-13 | Discharge: 2018-03-13 | Disposition: A | Discharge status: Home / Self Care | Payer: Medicare Other | Source: Ambulatory Visit | Attending: Physician Assistant | Admitting: Physician Assistant

## 2018-03-13 ENCOUNTER — Telehealth (INDEPENDENT_AMBULATORY_CARE_PROVIDER_SITE_OTHER): Payer: Self-pay | Admitting: Orthopaedic Surgery

## 2018-03-13 ENCOUNTER — Encounter (HOSPITAL_COMMUNITY)
Admission: RE | Admit: 2018-03-13 | Discharge: 2018-03-13 | Disposition: A | Discharge status: Home / Self Care | Payer: Medicare Other | Source: Ambulatory Visit | Attending: Orthopaedic Surgery | Admitting: Orthopaedic Surgery

## 2018-03-13 ENCOUNTER — Encounter (HOSPITAL_COMMUNITY): Payer: Self-pay

## 2018-03-13 DIAGNOSIS — H269 Unspecified cataract: Secondary | ICD-10-CM | POA: Insufficient documentation

## 2018-03-13 DIAGNOSIS — Z01818 Encounter for other preprocedural examination: Secondary | ICD-10-CM | POA: Diagnosis not present

## 2018-03-13 DIAGNOSIS — F329 Major depressive disorder, single episode, unspecified: Secondary | ICD-10-CM | POA: Insufficient documentation

## 2018-03-13 DIAGNOSIS — K219 Gastro-esophageal reflux disease without esophagitis: Secondary | ICD-10-CM | POA: Diagnosis not present

## 2018-03-13 DIAGNOSIS — F419 Anxiety disorder, unspecified: Secondary | ICD-10-CM | POA: Insufficient documentation

## 2018-03-13 DIAGNOSIS — M1712 Unilateral primary osteoarthritis, left knee: Secondary | ICD-10-CM | POA: Insufficient documentation

## 2018-03-13 DIAGNOSIS — Z79899 Other long term (current) drug therapy: Secondary | ICD-10-CM | POA: Insufficient documentation

## 2018-03-13 DIAGNOSIS — D72820 Lymphocytosis (symptomatic): Secondary | ICD-10-CM | POA: Diagnosis not present

## 2018-03-13 DIAGNOSIS — Z01812 Encounter for preprocedural laboratory examination: Secondary | ICD-10-CM | POA: Insufficient documentation

## 2018-03-13 DIAGNOSIS — F17200 Nicotine dependence, unspecified, uncomplicated: Secondary | ICD-10-CM | POA: Insufficient documentation

## 2018-03-13 HISTORY — DX: Nontoxic single thyroid nodule: E04.1

## 2018-03-13 LAB — TYPE AND SCREEN
ABO/RH(D): A POS
Antibody Screen: NEGATIVE

## 2018-03-13 LAB — CBC WITH DIFFERENTIAL/PLATELET
Band Neutrophils: 1 %
Basophils Absolute: 0 10*3/uL (ref 0.0–0.1)
Basophils Relative: 0 %
Blasts: 0 %
EOS PCT: 0 %
Eosinophils Absolute: 0 10*3/uL (ref 0.0–0.7)
HEMATOCRIT: 39.2 % (ref 36.0–46.0)
HEMOGLOBIN: 12.1 g/dL (ref 12.0–15.0)
LYMPHS ABS: 8.9 10*3/uL — AB (ref 0.7–4.0)
Lymphocytes Relative: 51 %
MCH: 27.3 pg (ref 26.0–34.0)
MCHC: 30.9 g/dL (ref 30.0–36.0)
MCV: 88.5 fL (ref 78.0–100.0)
Metamyelocytes Relative: 1 %
Monocytes Absolute: 0.7 10*3/uL (ref 0.1–1.0)
Monocytes Relative: 4 %
Myelocytes: 4 %
NEUTROS PCT: 39 %
NRBC: 0 /100{WBCs}
Neutro Abs: 7.8 10*3/uL — ABNORMAL HIGH (ref 1.7–7.7)
Other: 0 %
PROMYELOCYTES RELATIVE: 0 %
Platelets: 417 10*3/uL — ABNORMAL HIGH (ref 150–400)
RBC: 4.43 MIL/uL (ref 3.87–5.11)
RDW: 15.4 % (ref 11.5–15.5)
WBC: 17.4 10*3/uL — AB (ref 4.0–10.5)

## 2018-03-13 LAB — ABO/RH: ABO/RH(D): A POS

## 2018-03-13 LAB — PROTIME-INR
INR: 0.99
PROTHROMBIN TIME: 13 s (ref 11.4–15.2)

## 2018-03-13 LAB — COMPREHENSIVE METABOLIC PANEL
ALK PHOS: 94 U/L (ref 38–126)
ALT: 25 U/L (ref 14–54)
AST: 24 U/L (ref 15–41)
Albumin: 4.3 g/dL (ref 3.5–5.0)
Anion gap: 6 (ref 5–15)
BUN: 11 mg/dL (ref 6–20)
CALCIUM: 9.4 mg/dL (ref 8.9–10.3)
CO2: 28 mmol/L (ref 22–32)
CREATININE: 0.68 mg/dL (ref 0.44–1.00)
Chloride: 104 mmol/L (ref 101–111)
GFR calc Af Amer: >60 mL/min (ref >60)
Glucose, Bld: 102 mg/dL — ABNORMAL HIGH (ref 65–99)
Potassium: 3.8 mmol/L (ref 3.5–5.1)
Sodium: 138 mmol/L (ref 135–145)
Total Bilirubin: 0.6 mg/dL (ref 0.3–1.2)
Total Protein: 6.9 g/dL (ref 6.5–8.1)

## 2018-03-13 LAB — SURGICAL PCR SCREEN
MRSA, PCR: NEGATIVE
STAPHYLOCOCCUS AUREUS: NEGATIVE

## 2018-03-13 LAB — APTT: aPTT: 30 seconds (ref 24–36)

## 2018-03-13 NOTE — Telephone Encounter (Signed)
Called patient to let her know

## 2018-03-13 NOTE — Telephone Encounter (Signed)
Dental Cavity Decay under bridge  Pre-admit 03/19/18  Surgery Left Total Knee Arthroplasty   Pt would like to know if she is ok to proceed with surgery due to her dental health. Pt would like to know if she needs to send anything over pertaining to dental care. I provided number to dentistry just in case the clinic needed to ask any questions pertaining to pt care.

## 2018-03-13 NOTE — Telephone Encounter (Signed)
Dr Erlinda Hong does not understand what is needed.

## 2018-03-13 NOTE — Telephone Encounter (Signed)
As long as she does not have any active infection, she is ok to proceed with TKA.

## 2018-03-13 NOTE — Telephone Encounter (Addendum)
[facility] [address] [place] [address]  [phone]   Dental cavity decay under bridge

## 2018-03-14 NOTE — Progress Notes (Addendum)
Anesthesia Chart Review:   Case:  [id number] Date/Time:  03/19/18 0715   Procedure:  LEFT TOTAL KNEE ARTHROPLASTY (Left Knee)   Anesthesia type:  Spinal   Pre-op diagnosis:  left knee degenerative joint disease   Location:  [facility] OR ROOM 04 / [facility]   Surgeon:  Leandrew Koyanagi, MD      DISCUSSION:  - Pt is a 67 year old female with hx CLL.    VS: BP 127/75   Pulse 71   Temp 36.8 C   Resp 20   Ht 5' 3.5" (1.613 m)   Wt 177 lb 1.6 oz (80.3 kg)   SpO2 97%   BMI 30.88 kg/m    PROVIDERS: PCP is [place], Modena Nunnery, MD Oncologist is Curt Bears, MD   LABS: Labs reviewed: Acceptable for surgery.  - WBC count 17.4.  This is consistent with prior results; pt has CLL  (all labs ordered are listed, but only abnormal results are displayed)  Labs Reviewed  CBC WITH DIFFERENTIAL/PLATELET - Abnormal; Notable for the following components:      Result Value   WBC 17.4 (*)    Platelets 417 (*)    Neutro Abs 7.8 (*)    Lymphs Abs 8.9 (*)    All other components within normal limits  COMPREHENSIVE METABOLIC PANEL - Abnormal; Notable for the following components:   Glucose, Bld 102 (*)    All other components within normal limits  SURGICAL PCR SCREEN  APTT  PROTIME-INR  PATHOLOGIST SMEAR REVIEW  TYPE AND SCREEN  ABO/RH     IMAGES:  CXR 03/13/18: No active cardiopulmonary disease   EKG 12/29/17: Sinus  Rhythm. RSR(V1) -nondiagnostic   Past Medical History:  Diagnosis Date  . Allergy   . Anxiety   . Arthritis    knees hips neck  . Cancer [facility])    leukemia dx 06/2013 - no current treatment - being monitored  . Cataract    bilateral - being monitored, no treatment currently  . Depression   . GERD (gastroesophageal reflux disease)   . Leukocytosis    and low blood sodium   . Migraines   . Smoker   . Thyroid nodule    JUST WATCHING    Past Surgical History:  Procedure Laterality Date  . APPENDECTOMY    . BLADDER SURGERY     tack  . CERVICAL FUSION     x2  -  C5-C6   . COLONOSCOPY  2014   pyrtle - hx polyps  . DILATION AND CURETTAGE OF UTERUS     x 2  . TONSILLECTOMY      MEDICATIONS: . acetaminophen (TYLENOL) 500 MG tablet  . alum & mag hydroxide-simeth (MAALOX PLUS) 400-400-40 MG/5ML suspension  . clobetasol ointment (TEMOVATE) 0.05 %  . conjugated estrogens (PREMARIN) vaginal cream  . cyanocobalamin 2000 MCG tablet  . DENTA 5000 PLUS 1.1 % CREA dental cream  . diclofenac (VOLTAREN) 75 MG EC tablet  . diphenhydrAMINE-PE-APAP 25-5-325 MG TABS  . fluticasone (FLONASE) 50 MCG/ACT nasal spray  . glucosamine-chondroitin 500-400 MG tablet  . lidocaine (LMX) 4 % cream  . Lifitegrast (XIIDRA) 5 % SOLN  . LUTEIN-ZEAXANTHIN PO  . Omega-3 Fatty Acids (FISH OIL) 1200 MG CAPS  . pantoprazole (PROTONIX) 40 MG tablet  . Polyethyl Glycol-Propyl Glycol (SYSTANE OP)  . ranitidine (ZANTAC) 150 MG tablet  . SUMAtriptan (IMITREX) 50 MG tablet  . traMADol (ULTRAM) 50 MG tablet  . Turmeric Curcumin 500 MG CAPS  No current facility-administered medications for this encounter.     If no changes, I anticipate pt can proceed with surgery as scheduled.   Willeen Cass, FNP-BC [facility]/Anesthesiology Phone: [phone] 03/14/2018 11:11 AM

## 2018-03-15 LAB — PATHOLOGIST SMEAR REVIEW

## 2018-03-16 MED ORDER — TRANEXAMIC ACID 1000 MG/10ML IV SOLN
1000.0000 mg | INTRAVENOUS | Status: AC
  Administered 2018-03-19: 1000 mg via INTRAVENOUS
  Filled 2018-03-16: qty 1100

## 2018-03-19 ENCOUNTER — Encounter (HOSPITAL_COMMUNITY): Payer: Self-pay | Admitting: *Deleted

## 2018-03-19 ENCOUNTER — Encounter (HOSPITAL_COMMUNITY)
Admission: RE | Disposition: A | Discharge status: Home / Self Care | Payer: Self-pay | Source: Ambulatory Visit | Attending: Orthopaedic Surgery

## 2018-03-19 ENCOUNTER — Ambulatory Visit (HOSPITAL_COMMUNITY): Payer: Medicare Other | Admitting: Emergency Medicine

## 2018-03-19 ENCOUNTER — Other Ambulatory Visit: Payer: Self-pay

## 2018-03-19 ENCOUNTER — Inpatient Hospital Stay (HOSPITAL_COMMUNITY): Payer: Medicare Other

## 2018-03-19 ENCOUNTER — Observation Stay (HOSPITAL_COMMUNITY)
Admission: RE | Admit: 2018-03-19 | Discharge: 2018-03-20 | Disposition: A | Discharge status: Home / Self Care | Payer: Medicare Other | Source: Ambulatory Visit | Attending: Orthopaedic Surgery | Admitting: Orthopaedic Surgery

## 2018-03-19 ENCOUNTER — Ambulatory Visit (HOSPITAL_COMMUNITY): Payer: Medicare Other | Admitting: Certified Registered Nurse Anesthetist

## 2018-03-19 DIAGNOSIS — Z471 Aftercare following joint replacement surgery: Secondary | ICD-10-CM | POA: Diagnosis not present

## 2018-03-19 DIAGNOSIS — Z856 Personal history of leukemia: Secondary | ICD-10-CM | POA: Diagnosis not present

## 2018-03-19 DIAGNOSIS — M1712 Unilateral primary osteoarthritis, left knee: Principal | ICD-10-CM | POA: Insufficient documentation

## 2018-03-19 DIAGNOSIS — G43909 Migraine, unspecified, not intractable, without status migrainosus: Secondary | ICD-10-CM | POA: Insufficient documentation

## 2018-03-19 DIAGNOSIS — E039 Hypothyroidism, unspecified: Secondary | ICD-10-CM | POA: Diagnosis not present

## 2018-03-19 DIAGNOSIS — Z87891 Personal history of nicotine dependence: Secondary | ICD-10-CM | POA: Diagnosis not present

## 2018-03-19 DIAGNOSIS — G8918 Other acute postprocedural pain: Secondary | ICD-10-CM | POA: Diagnosis not present

## 2018-03-19 DIAGNOSIS — Z79891 Long term (current) use of opiate analgesic: Secondary | ICD-10-CM | POA: Diagnosis not present

## 2018-03-19 DIAGNOSIS — Z79899 Other long term (current) drug therapy: Secondary | ICD-10-CM | POA: Insufficient documentation

## 2018-03-19 DIAGNOSIS — Z96652 Presence of left artificial knee joint: Secondary | ICD-10-CM | POA: Diagnosis not present

## 2018-03-19 DIAGNOSIS — K219 Gastro-esophageal reflux disease without esophagitis: Secondary | ICD-10-CM | POA: Insufficient documentation

## 2018-03-19 DIAGNOSIS — Z96659 Presence of unspecified artificial knee joint: Secondary | ICD-10-CM

## 2018-03-19 DIAGNOSIS — Z7951 Long term (current) use of inhaled steroids: Secondary | ICD-10-CM | POA: Diagnosis not present

## 2018-03-19 HISTORY — PX: TOTAL KNEE ARTHROPLASTY: SHX125

## 2018-03-19 SURGERY — ARTHROPLASTY, KNEE, TOTAL
Anesthesia: Spinal | Site: Knee | Laterality: Left

## 2018-03-19 MED ORDER — OXYCODONE HCL 5 MG PO TABS
5.0000 mg | ORAL_TABLET | ORAL | Status: DC | PRN
  Administered 2018-03-19 – 2018-03-20 (×2): 10 mg via ORAL
  Administered 2018-03-20: 5 mg via ORAL
  Filled 2018-03-19 (×3): qty 2

## 2018-03-19 MED ORDER — CHLORHEXIDINE GLUCONATE 4 % EX LIQD: 60.0000 mL | Freq: Once | CUTANEOUS | Status: DC

## 2018-03-19 MED ORDER — SODIUM CHLORIDE 0.9 % IR SOLN
Status: DC | PRN
  Administered 2018-03-19: 3000 mL

## 2018-03-19 MED ORDER — PROPOFOL 10 MG/ML IV BOLUS
INTRAVENOUS | Status: AC
  Filled 2018-03-19: qty 20

## 2018-03-19 MED ORDER — MIDAZOLAM HCL 5 MG/5ML IJ SOLN
INTRAMUSCULAR | Status: DC | PRN
  Administered 2018-03-19 (×2): 1 mg via INTRAVENOUS

## 2018-03-19 MED ORDER — BUPIVACAINE IN DEXTROSE 0.75-8.25 % IT SOLN
INTRATHECAL | Status: DC | PRN
  Administered 2018-03-19: 1.6 mL via INTRATHECAL

## 2018-03-19 MED ORDER — HYDROMORPHONE HCL 2 MG/ML IJ SOLN
INTRAMUSCULAR | Status: AC
Start: 2018-03-19 — End: 2018-03-19
  Filled 2018-03-19: qty 1

## 2018-03-19 MED ORDER — PHENYLEPHRINE HCL 10 MG/ML IJ SOLN
INTRAMUSCULAR | Status: DC | PRN
  Administered 2018-03-19: 25 ug/min via INTRAVENOUS

## 2018-03-19 MED ORDER — OXYCODONE HCL 5 MG PO TABS: 5.0000 mg | ORAL_TABLET | ORAL | 0 refills | Status: DC | PRN

## 2018-03-19 MED ORDER — METHOCARBAMOL 500 MG PO TABS
500.0000 mg | ORAL_TABLET | Freq: Four times a day (QID) | ORAL | Status: DC | PRN
  Administered 2018-03-20: 500 mg via ORAL
  Filled 2018-03-19 (×2): qty 1

## 2018-03-19 MED ORDER — DOCUSATE SODIUM 100 MG PO CAPS
100.0000 mg | ORAL_CAPSULE | Freq: Two times a day (BID) | ORAL | Status: DC
  Administered 2018-03-19 – 2018-03-20 (×2): 100 mg via ORAL
  Filled 2018-03-19 (×2): qty 1

## 2018-03-19 MED ORDER — FENTANYL CITRATE (PF) 250 MCG/5ML IJ SOLN
INTRAMUSCULAR | Status: AC
  Filled 2018-03-19: qty 5

## 2018-03-19 MED ORDER — PHENOL 1.4 % MT LIQD: 1.0000 | OROMUCOSAL | Status: DC | PRN

## 2018-03-19 MED ORDER — DIPHENHYDRAMINE HCL 12.5 MG/5ML PO ELIX: 25.0000 mg | ORAL_SOLUTION | ORAL | Status: DC | PRN

## 2018-03-19 MED ORDER — OXYCODONE HCL 5 MG PO TABS: 10.0000 mg | ORAL_TABLET | ORAL | Status: DC | PRN

## 2018-03-19 MED ORDER — METHOCARBAMOL 1000 MG/10ML IJ SOLN
500.0000 mg | Freq: Four times a day (QID) | INTRAVENOUS | Status: DC | PRN
  Filled 2018-03-19: qty 5

## 2018-03-19 MED ORDER — ONDANSETRON HCL 4 MG PO TABS: 4.0000 mg | ORAL_TABLET | Freq: Three times a day (TID) | ORAL | 0 refills | Status: DC | PRN

## 2018-03-19 MED ORDER — KETOROLAC TROMETHAMINE 15 MG/ML IJ SOLN
30.0000 mg | Freq: Four times a day (QID) | INTRAMUSCULAR | Status: AC
  Administered 2018-03-19 – 2018-03-20 (×4): 30 mg via INTRAVENOUS
  Filled 2018-03-19 (×4): qty 2

## 2018-03-19 MED ORDER — CELECOXIB 200 MG PO CAPS
200.0000 mg | ORAL_CAPSULE | Freq: Two times a day (BID) | ORAL | Status: DC
  Administered 2018-03-19 – 2018-03-20 (×2): 200 mg via ORAL
  Filled 2018-03-19 (×2): qty 1

## 2018-03-19 MED ORDER — ASPIRIN EC 81 MG PO TBEC: 81.0000 mg | DELAYED_RELEASE_TABLET | Freq: Two times a day (BID) | ORAL | 0 refills | Status: DC

## 2018-03-19 MED ORDER — HYDROMORPHONE HCL 2 MG/ML IJ SOLN
0.3000 mg | INTRAMUSCULAR | Status: DC | PRN
  Administered 2018-03-19 (×2): 0.5 mg via INTRAVENOUS

## 2018-03-19 MED ORDER — LIFITEGRAST 5 % OP SOLN: 1.0000 [drp] | OPHTHALMIC | Status: DC

## 2018-03-19 MED ORDER — SODIUM CHLORIDE 0.9 % IV SOLN
1000.0000 mg | Freq: Once | INTRAVENOUS | Status: AC
  Administered 2018-03-19: 1000 mg via INTRAVENOUS
  Filled 2018-03-19: qty 10

## 2018-03-19 MED ORDER — DEXAMETHASONE SODIUM PHOSPHATE 10 MG/ML IJ SOLN
10.0000 mg | Freq: Once | INTRAMUSCULAR | Status: AC
  Administered 2018-03-20: 10 mg via INTRAVENOUS
  Filled 2018-03-19: qty 1

## 2018-03-19 MED ORDER — SORBITOL 70 % SOLN: 30.0000 mL | Freq: Every day | Status: DC | PRN

## 2018-03-19 MED ORDER — ACETAMINOPHEN 500 MG PO TABS
1000.0000 mg | ORAL_TABLET | Freq: Four times a day (QID) | ORAL | Status: AC
  Administered 2018-03-19 – 2018-03-20 (×4): 1000 mg via ORAL
  Filled 2018-03-19 (×4): qty 2

## 2018-03-19 MED ORDER — TIZANIDINE HCL 4 MG PO TABS: 4.0000 mg | ORAL_TABLET | Freq: Four times a day (QID) | ORAL | 2 refills | Status: DC | PRN

## 2018-03-19 MED ORDER — PROMETHAZINE HCL 25 MG/ML IJ SOLN: 6.2500 mg | INTRAMUSCULAR | Status: DC | PRN

## 2018-03-19 MED ORDER — POLYETHYLENE GLYCOL 3350 17 G PO PACK: 17.0000 g | PACK | Freq: Every day | ORAL | Status: DC | PRN

## 2018-03-19 MED ORDER — ASPIRIN 81 MG PO CHEW
81.0000 mg | CHEWABLE_TABLET | Freq: Two times a day (BID) | ORAL | Status: DC
  Administered 2018-03-19 – 2018-03-20 (×2): 81 mg via ORAL
  Filled 2018-03-19 (×2): qty 1

## 2018-03-19 MED ORDER — DEXAMETHASONE SODIUM PHOSPHATE 10 MG/ML IJ SOLN
INTRAMUSCULAR | Status: DC | PRN
  Administered 2018-03-19: 5 mg via INTRAVENOUS

## 2018-03-19 MED ORDER — PROMETHAZINE HCL 25 MG PO TABS: 25.0000 mg | ORAL_TABLET | Freq: Four times a day (QID) | ORAL | 1 refills | Status: DC | PRN

## 2018-03-19 MED ORDER — CEFAZOLIN SODIUM-DEXTROSE 2-4 GM/100ML-% IV SOLN
2.0000 g | INTRAVENOUS | Status: AC
  Administered 2018-03-19: 2 g via INTRAVENOUS
  Filled 2018-03-19: qty 100

## 2018-03-19 MED ORDER — POLYVINYL ALCOHOL 1.4 % OP SOLN
Freq: Two times a day (BID) | OPHTHALMIC | Status: DC
  Administered 2018-03-19 – 2018-03-20 (×2): via OPHTHALMIC
  Filled 2018-03-19: qty 15

## 2018-03-19 MED ORDER — CEFAZOLIN SODIUM-DEXTROSE 2-4 GM/100ML-% IV SOLN
2.0000 g | Freq: Four times a day (QID) | INTRAVENOUS | Status: AC
  Administered 2018-03-19 – 2018-03-20 (×3): 2 g via INTRAVENOUS
  Filled 2018-03-19 (×3): qty 100

## 2018-03-19 MED ORDER — ACETAMINOPHEN 325 MG PO TABS: 325.0000 mg | ORAL_TABLET | Freq: Four times a day (QID) | ORAL | Status: DC | PRN

## 2018-03-19 MED ORDER — ONDANSETRON HCL 4 MG/2ML IJ SOLN: 4.0000 mg | Freq: Four times a day (QID) | INTRAMUSCULAR | Status: DC | PRN

## 2018-03-19 MED ORDER — SODIUM CHLORIDE 0.9% FLUSH
INTRAVENOUS | Status: DC | PRN
  Administered 2018-03-19: 40 mL

## 2018-03-19 MED ORDER — OXYCODONE HCL ER 10 MG PO T12A: 10.0000 mg | EXTENDED_RELEASE_TABLET | Freq: Two times a day (BID) | ORAL | 0 refills | Status: AC

## 2018-03-19 MED ORDER — ALUM & MAG HYDROXIDE-SIMETH 200-200-20 MG/5ML PO SUSP: 30.0000 mL | ORAL | Status: DC | PRN

## 2018-03-19 MED ORDER — LACTATED RINGERS IV SOLN
INTRAVENOUS | Status: DC | PRN
  Administered 2018-03-19: 07:00:00 via INTRAVENOUS

## 2018-03-19 MED ORDER — 0.9 % SODIUM CHLORIDE (POUR BTL) OPTIME
TOPICAL | Status: DC | PRN
  Administered 2018-03-19: 1000 mL

## 2018-03-19 MED ORDER — SUMATRIPTAN SUCCINATE 50 MG PO TABS
50.0000 mg | ORAL_TABLET | ORAL | Status: DC | PRN
  Filled 2018-03-19: qty 1

## 2018-03-19 MED ORDER — SENNOSIDES-DOCUSATE SODIUM 8.6-50 MG PO TABS: 1.0000 | ORAL_TABLET | Freq: Every evening | ORAL | 1 refills | Status: DC | PRN

## 2018-03-19 MED ORDER — VANCOMYCIN HCL POWD
Status: DC | PRN
  Administered 2018-03-19: 1000 mg via TOPICAL

## 2018-03-19 MED ORDER — LACTATED RINGERS IV SOLN: INTRAVENOUS | Status: DC

## 2018-03-19 MED ORDER — SODIUM CHLORIDE 0.9 % IJ SOLN
INTRAMUSCULAR | Status: AC
  Filled 2018-03-19: qty 10

## 2018-03-19 MED ORDER — PANTOPRAZOLE SODIUM 40 MG PO TBEC
40.0000 mg | DELAYED_RELEASE_TABLET | Freq: Every day | ORAL | Status: DC
  Administered 2018-03-20: 40 mg via ORAL
  Filled 2018-03-19: qty 1

## 2018-03-19 MED ORDER — ONDANSETRON HCL 4 MG PO TABS: 4.0000 mg | ORAL_TABLET | Freq: Four times a day (QID) | ORAL | Status: DC | PRN

## 2018-03-19 MED ORDER — MAGNESIUM CITRATE PO SOLN: 1.0000 | Freq: Once | ORAL | Status: DC | PRN

## 2018-03-19 MED ORDER — FENTANYL CITRATE (PF) 100 MCG/2ML IJ SOLN
INTRAMUSCULAR | Status: DC | PRN
  Administered 2018-03-19 (×2): 50 ug via INTRAVENOUS

## 2018-03-19 MED ORDER — SODIUM CHLORIDE 0.9 % IV SOLN
2000.0000 mg | INTRAVENOUS | Status: AC
  Administered 2018-03-19: 2000 mg via TOPICAL
  Filled 2018-03-19: qty 20

## 2018-03-19 MED ORDER — HYDROMORPHONE HCL 2 MG/ML IJ SOLN
0.5000 mg | INTRAMUSCULAR | Status: DC | PRN
  Filled 2018-03-19: qty 1

## 2018-03-19 MED ORDER — OXYCODONE HCL ER 10 MG PO T12A
10.0000 mg | EXTENDED_RELEASE_TABLET | Freq: Two times a day (BID) | ORAL | Status: DC
  Administered 2018-03-19 – 2018-03-20 (×3): 10 mg via ORAL
  Filled 2018-03-19 (×3): qty 1

## 2018-03-19 MED ORDER — ONDANSETRON HCL 4 MG/2ML IJ SOLN
INTRAMUSCULAR | Status: DC | PRN
  Administered 2018-03-19: 4 mg via INTRAVENOUS

## 2018-03-19 MED ORDER — METOCLOPRAMIDE HCL 5 MG PO TABS: 5.0000 mg | ORAL_TABLET | Freq: Three times a day (TID) | ORAL | Status: DC | PRN

## 2018-03-19 MED ORDER — ONDANSETRON HCL 4 MG/2ML IJ SOLN
INTRAMUSCULAR | Status: AC
  Filled 2018-03-19: qty 2

## 2018-03-19 MED ORDER — ROPIVACAINE HCL 7.5 MG/ML IJ SOLN
INTRAMUSCULAR | Status: DC | PRN
  Administered 2018-03-19: 20 mL via PERINEURAL

## 2018-03-19 MED ORDER — METHOCARBAMOL 750 MG PO TABS: 750.0000 mg | ORAL_TABLET | Freq: Two times a day (BID) | ORAL | 0 refills | Status: DC | PRN

## 2018-03-19 MED ORDER — MENTHOL 3 MG MT LOZG: 1.0000 | LOZENGE | OROMUCOSAL | Status: DC | PRN

## 2018-03-19 MED ORDER — MIDAZOLAM HCL 2 MG/2ML IJ SOLN
INTRAMUSCULAR | Status: AC
  Filled 2018-03-19: qty 2

## 2018-03-19 MED ORDER — GABAPENTIN 300 MG PO CAPS
300.0000 mg | ORAL_CAPSULE | Freq: Three times a day (TID) | ORAL | Status: DC
  Administered 2018-03-19 – 2018-03-20 (×4): 300 mg via ORAL
  Filled 2018-03-19 (×4): qty 1

## 2018-03-19 MED ORDER — VANCOMYCIN HCL 1000 MG IV SOLR
INTRAVENOUS | Status: AC
  Filled 2018-03-19: qty 1000

## 2018-03-19 MED ORDER — EPHEDRINE SULFATE 50 MG/ML IJ SOLN
INTRAMUSCULAR | Status: AC
  Filled 2018-03-19: qty 1

## 2018-03-19 MED ORDER — DEXAMETHASONE SODIUM PHOSPHATE 10 MG/ML IJ SOLN
INTRAMUSCULAR | Status: AC
  Filled 2018-03-19: qty 1

## 2018-03-19 MED ORDER — FLUTICASONE PROPIONATE 50 MCG/ACT NA SUSP: 1.0000 | Freq: Every day | NASAL | Status: DC | PRN

## 2018-03-19 MED ORDER — BUPIVACAINE LIPOSOME 1.3 % IJ SUSP
20.0000 mL | INTRAMUSCULAR | Status: AC
  Administered 2018-03-19: 20 mL
  Filled 2018-03-19: qty 20

## 2018-03-19 MED ORDER — PROPOFOL 500 MG/50ML IV EMUL
INTRAVENOUS | Status: DC | PRN
  Administered 2018-03-19: 75 ug/kg/min via INTRAVENOUS

## 2018-03-19 MED ORDER — PROPOFOL 10 MG/ML IV BOLUS
INTRAVENOUS | Status: DC | PRN
  Administered 2018-03-19: 20 mg via INTRAVENOUS

## 2018-03-19 MED ORDER — SODIUM CHLORIDE 0.9 % IV SOLN
INTRAVENOUS | Status: DC
  Administered 2018-03-19 – 2018-03-20 (×2): via INTRAVENOUS

## 2018-03-19 MED ORDER — METOCLOPRAMIDE HCL 5 MG/ML IJ SOLN: 5.0000 mg | Freq: Three times a day (TID) | INTRAMUSCULAR | Status: DC | PRN

## 2018-03-19 MED FILL — Vancomycin HCl For IV Soln 1 GM (Base Equivalent): INTRAVENOUS | Qty: 1000 | Status: AC

## 2018-03-19 SURGICAL SUPPLY — 78 items
ALCOHOL ISOPROPYL (RUBBING) (MISCELLANEOUS) ×2 IMPLANT
APL SKNCLS STERI-STRIP NONHPOA (GAUZE/BANDAGES/DRESSINGS)
BAG DECANTER FOR FLEXI CONT (MISCELLANEOUS) ×2 IMPLANT
BANDAGE ELASTIC 6 VELCRO ST LF (GAUZE/BANDAGES/DRESSINGS) ×1 IMPLANT
BANDAGE ESMARK 6X9 LF (GAUZE/BANDAGES/DRESSINGS) ×1 IMPLANT
BASEPLATE TIBIAL PRIMARY SZ4 (Plate) ×1 IMPLANT
BEARIN TIBIAL TRIATH (Orthopedic Implant) ×2 IMPLANT
BEARING TIBIAL TRIATH (Orthopedic Implant) IMPLANT
BENZOIN TINCTURE PRP APPL 2/3 (GAUZE/BANDAGES/DRESSINGS) ×1 IMPLANT
BLADE SAW SGTL 13.0X1.19X90.0M (BLADE) ×2 IMPLANT
BNDG CMPR 9X6 STRL LF SNTH (GAUZE/BANDAGES/DRESSINGS) ×1
BNDG CMPR MED 10X6 ELC LF (GAUZE/BANDAGES/DRESSINGS) ×1
BNDG ELASTIC 6X10 VLCR STRL LF (GAUZE/BANDAGES/DRESSINGS) ×2 IMPLANT
BNDG ESMARK 6X9 LF (GAUZE/BANDAGES/DRESSINGS) ×2
BOWL SMART MIX CTS (DISPOSABLE) ×1 IMPLANT
BSPLAT TIB 4 CMNT PRM STRL KN (Plate) ×1 IMPLANT
CEMENT BONE R 1X40 (Cement) ×2 IMPLANT
CLSR STERI-STRIP ANTIMIC 1/2X4 (GAUZE/BANDAGES/DRESSINGS) ×3 IMPLANT
COVER SURGICAL LIGHT HANDLE (MISCELLANEOUS) ×2 IMPLANT
CUFF TOURNIQUET SINGLE 34IN LL (TOURNIQUET CUFF) ×1 IMPLANT
CUFF TOURNIQUET SINGLE 44IN (TOURNIQUET CUFF) ×1 IMPLANT
DRAPE EXTREMITY T 121X128X90 (DRAPE) ×2 IMPLANT
DRAPE HALF SHEET 40X57 (DRAPES) ×2 IMPLANT
DRAPE INCISE IOBAN 66X45 STRL (DRAPES) ×1 IMPLANT
DRAPE ORTHO SPLIT 77X108 STRL (DRAPES) ×4
DRAPE POUCH INSTRU U-SHP 10X18 (DRAPES) ×2 IMPLANT
DRAPE SURG 17X11 SM STRL (DRAPES) ×4 IMPLANT
DRAPE SURG ORHT 6 SPLT 77X108 (DRAPES) ×2 IMPLANT
DRSG AQUACEL AG ADV 3.5X14 (GAUZE/BANDAGES/DRESSINGS) ×2 IMPLANT
DURAPREP 26ML APPLICATOR (WOUND CARE) ×4 IMPLANT
ELECT CAUTERY BLADE 6.4 (BLADE) ×2 IMPLANT
ELECT REM PT RETURN 9FT ADLT (ELECTROSURGICAL) ×2
ELECTRODE REM PT RTRN 9FT ADLT (ELECTROSURGICAL) ×1 IMPLANT
FEMORAL PEG DISTAL FIXATION (Orthopedic Implant) ×1 IMPLANT
FEMORAL POST STABILIZED NO 5 (Orthopedic Implant) ×1 IMPLANT
GLOVE BIOGEL PI IND STRL 7.0 (GLOVE) ×1 IMPLANT
GLOVE BIOGEL PI INDICATOR 7.0 (GLOVE) ×1
GLOVE ECLIPSE 7.0 STRL STRAW (GLOVE) ×4 IMPLANT
GLOVE SKINSENSE NS SZ7.5 (GLOVE) ×1
GLOVE SKINSENSE STRL SZ7.5 (GLOVE) ×1 IMPLANT
GLOVE SURG SS PI 6.0 STRL IVOR (GLOVE) ×1 IMPLANT
GLOVE SURG SYN 7.5  E (GLOVE) ×4
GLOVE SURG SYN 7.5 E (GLOVE) ×4 IMPLANT
GLOVE SURG SYN 7.5 PF PI (GLOVE) ×4 IMPLANT
GOWN STRL REIN XL XLG (GOWN DISPOSABLE) ×2 IMPLANT
GOWN STRL REUS W/ TWL LRG LVL3 (GOWN DISPOSABLE) ×1 IMPLANT
GOWN STRL REUS W/TWL LRG LVL3 (GOWN DISPOSABLE) ×2
HANDPIECE INTERPULSE COAX TIP (DISPOSABLE) ×2
HOOD PEEL AWAY FLYTE STAYCOOL (MISCELLANEOUS) ×4 IMPLANT
KIT BASIN OR (CUSTOM PROCEDURE TRAY) ×2 IMPLANT
KIT TURNOVER KIT B (KITS) ×2 IMPLANT
MANIFOLD NEPTUNE II (INSTRUMENTS) ×2 IMPLANT
MARKER SKIN DUAL TIP RULER LAB (MISCELLANEOUS) ×2 IMPLANT
NDL SPNL 18GX3.5 QUINCKE PK (NEEDLE) ×1 IMPLANT
NEEDLE SPNL 18GX3.5 QUINCKE PK (NEEDLE) ×4 IMPLANT
NS IRRIG 1000ML POUR BTL (IV SOLUTION) ×2 IMPLANT
PACK TOTAL JOINT (CUSTOM PROCEDURE TRAY) ×2 IMPLANT
PAD ARMBOARD 7.5X6 YLW CONV (MISCELLANEOUS) ×4 IMPLANT
PATELLA TRIATHLON SZ 29 9 MM (Orthopedic Implant) ×1 IMPLANT
SAW OSC TIP CART 19.5X105X1.3 (SAW) ×2 IMPLANT
SET HNDPC FAN SPRY TIP SCT (DISPOSABLE) ×1 IMPLANT
STAPLER VISISTAT 35W (STAPLE) IMPLANT
SUCTION FRAZIER HANDLE 10FR (MISCELLANEOUS) ×1
SUCTION TUBE FRAZIER 10FR DISP (MISCELLANEOUS) ×1 IMPLANT
SUT ETHILON 2 0 FS 18 (SUTURE) IMPLANT
SUT MNCRL AB 4-0 PS2 18 (SUTURE) ×1 IMPLANT
SUT VIC AB 0 CT1 27 (SUTURE) ×4
SUT VIC AB 0 CT1 27XBRD ANBCTR (SUTURE) ×2 IMPLANT
SUT VIC AB 1 CTX 27 (SUTURE) ×6 IMPLANT
SUT VIC AB 2-0 CT1 27 (SUTURE) ×6
SUT VIC AB 2-0 CT1 TAPERPNT 27 (SUTURE) ×3 IMPLANT
SYR 50ML LL SCALE MARK (SYRINGE) ×3 IMPLANT
SYRINGE 60CC LL (MISCELLANEOUS) ×1 IMPLANT
TOWEL OR 17X24 6PK STRL BLUE (TOWEL DISPOSABLE) ×2 IMPLANT
TOWEL OR 17X26 10 PK STRL BLUE (TOWEL DISPOSABLE) ×2 IMPLANT
TRAY CATH 16FR W/PLASTIC CATH (SET/KITS/TRAYS/PACK) IMPLANT
UNDERPAD 30X30 (UNDERPADS AND DIAPERS) ×2 IMPLANT
WRAP KNEE MAXI GEL POST OP (GAUZE/BANDAGES/DRESSINGS) ×2 IMPLANT

## 2018-03-19 NOTE — H&P (Signed)
PREOPERATIVE H&P  Chief Complaint: left knee degenerative joint disease  HPI: Kimberly King is a 67 y.o. female who presents for surgical treatment of left knee degenerative joint disease.  She denies any changes in medical history.  Past Medical History:  Diagnosis Date  . Allergy   . Anxiety   . Arthritis    knees hips neck  . Cancer [facility])    leukemia dx 06/2013 - no current treatment - being monitored  . Cataract    bilateral - being monitored, no treatment currently  . Depression   . GERD (gastroesophageal reflux disease)   . Leukocytosis    and low blood sodium   . Migraines   . Smoker   . Thyroid nodule    JUST WATCHING   Past Surgical History:  Procedure Laterality Date  . APPENDECTOMY    . BLADDER SURGERY     tack  . CERVICAL FUSION     x2  -  C5-C6  . COLONOSCOPY  2014   pyrtle - hx polyps  . DILATION AND CURETTAGE OF UTERUS     x 2  . knee     arthroscopy    left   07/2017  . TONSILLECTOMY     Social History   Socioeconomic History  . Marital status: Divorced    Spouse name: Not on file  . Number of children: Not on file  . Years of education: Not on file  . Highest education level: Not on file  Occupational History  . Not on file  Social Needs  . Financial resource strain: Not on file  . Food insecurity:    Worry: Not on file    Inability: Not on file  . Transportation needs:    Medical: Not on file    Non-medical: Not on file  Tobacco Use  . Smoking status: Former Smoker    Packs/day: 0.25    Years: 30.00    Pack years: 7.50    Types: Cigarettes    Last attempt to quit: 04/22/2015    Years since quitting: 2.9  . Smokeless tobacco: Never Used  Substance and Sexual Activity  . Alcohol use: Yes    Comment: Occasional  . Drug use: No  . Sexual activity: Not on file  Lifestyle  . Physical activity:    Days per week: Not on file    Minutes per session: Not on file  . Stress: Not on file  Relationships  . Social connections:    Talks on phone: Not on file    Gets together: Not on file    Attends religious service: Not on file    Active member of club or organization: Not on file    Attends meetings of clubs or organizations: Not on file    Relationship status: Not on file  Other Topics Concern  . Not on file  Social History Narrative  . Not on file   Family History  Problem Relation Age of Onset  . Colon cancer Paternal Grandmother 48  . Esophageal cancer Paternal Uncle   . Arthritis Mother   . Heart disease Father   . Lung cancer Father   . Lung cancer Maternal Grandfather   . Breast cancer Maternal Grandmother   . Cirrhosis Brother   . Rectal cancer Neg Hx   . Stomach cancer Neg Hx    No Known Allergies Prior to Admission medications   Medication Sig Start Date End Date Taking? Authorizing Provider  acetaminophen (  TYLENOL) 500 MG tablet Take 1,000 mg by mouth every 6 (six) hours as needed for moderate pain or headache.   Yes [provider]  alum & mag hydroxide-simeth (MAALOX PLUS) 400-400-40 MG/5ML suspension Take 10 mLs by mouth at bedtime as needed for indigestion.   Yes [provider]  cyanocobalamin 2000 MCG tablet Take 2,000 mcg by mouth daily.   Yes [provider]  DENTA 5000 PLUS 1.1 % CREA dental cream Place 1 application onto teeth at bedtime. brush teeth for 2 minutes and spit out do not rinse 11/21/17  Yes [provider]  diclofenac (VOLTAREN) 75 MG EC tablet Take 75 mg by mouth every other day.    Yes [provider]  diphenhydrAMINE-PE-APAP 25-5-325 MG TABS Take 1 tablet by mouth daily as needed (for allergies).   Yes [provider]  fluticasone (FLONASE) 50 MCG/ACT nasal spray Place 1 spray into both nostrils daily as needed for allergies or rhinitis.   Yes [provider]  glucosamine-chondroitin 500-400 MG tablet Take 1 tablet by mouth daily.    Yes [provider]  lidocaine (LMX) 4 % cream Apply 1 application  topically 2 (two) times daily as needed (for pain).  04/06/16  Yes [provider]  Lifitegrast Shirley Friar) 5 % SOLN Place 1 drop into both eyes 2 (two) times a week.    Yes [provider]  LUTEIN-ZEAXANTHIN PO Take 1 tablet by mouth daily.   Yes [provider]  Omega-3 Fatty Acids (FISH OIL) 1200 MG CAPS Take 1,200 mg by mouth daily.    Yes [provider]  pantoprazole (PROTONIX) 40 MG tablet Take 1 tablet (40 mg total) by mouth daily. 06/30/17  Yes [place], Modena Nunnery, MD  Polyethyl Glycol-Propyl Glycol (SYSTANE OP) Place 1 drop into both eyes 2 (two) times daily.   Yes [provider]  ranitidine (ZANTAC) 150 MG tablet Take 1 tablet (150 mg total) by mouth at bedtime. 12/29/17  Yes [place], Modena Nunnery, MD  traMADol (ULTRAM) 50 MG tablet TAKE 1 TABLET (50 MG TOTAL) BY MOUTH EVERY 6 (SIX) HOURS AS NEEDED. REPORTED ON 12/24/2015 Patient taking differently: Take 50 mg by mouth 2 (two) times daily as needed for moderate pain.  01/24/18  Yes [place], Modena Nunnery, MD  Turmeric Curcumin 500 MG CAPS Take 500 mg by mouth daily.   Yes [provider]  clobetasol ointment (TEMOVATE) 2.02 % Apply 1 application topically 2 (two) times a week. Patient not taking: Reported on 03/12/2018 07/03/17   Alycia Rossetti, MD  conjugated estrogens (PREMARIN) vaginal cream Place 1 Applicatorful vaginally daily. Patient not taking: Reported on 03/12/2018 06/30/17   Alycia Rossetti, MD  SUMAtriptan (IMITREX) 50 MG tablet Take 50 mg by mouth every 2 (two) hours as needed for migraine.     [provider]     Positive ROS: All other systems have been reviewed and were otherwise negative with the exception of those mentioned in the HPI and as above.  Physical Exam: General: Alert, no acute distress Cardiovascular: No pedal edema Respiratory: No cyanosis, no use of accessory musculature GI: abdomen soft Skin: No lesions in the area of chief complaint Neurologic:  Sensation intact distally Psychiatric: Patient is competent for consent with normal mood and affect Lymphatic: no lymphedema  MUSCULOSKELETAL: exam stable  Assessment: left knee degenerative joint disease  Plan: Plan for Procedure(s): LEFT TOTAL KNEE ARTHROPLASTY  The risks benefits and alternatives were discussed with the patient including but  not limited to the risks of nonoperative treatment, versus surgical intervention including infection, bleeding, nerve injury,  blood clots, cardiopulmonary complications, morbidity, mortality, among others, and they were willing to proceed.   Preoperative templating of the joint replacement has been completed, documented, and submitted to the Operating Room personnel in order to optimize intra-operative equipment management.  Anticipated LOS equal to or greater than 2 midnights due to - Age 108 and older with one or more of the following:  - Obesity  - Expected need for hospital services (PT, OT, Nursing) required for safe discharge  - Anticipated need for postoperative skilled nursing care or inpatient rehab  - Active co-morbidities: None  Eduard Roux, MD   03/19/2018 7:34 AM

## 2018-03-19 NOTE — Discharge Instructions (Signed)

## 2018-03-19 NOTE — Progress Notes (Signed)
Orthopedic Tech Progress Note Patient Details:  Kimberly King [birth year] [id number]  CPM Left Knee CPM Left Knee: On Left Knee Flexion (Degrees): 90 Left Knee Extension (Degrees): 0  Post Interventions Patient Tolerated: Well Instructions Provided: Care of device Ortho Devices Ortho Device/Splint Location: applied ohf to bed Ortho Device/Splint Interventions: Ordered, Application, Adjustment   Post Interventions Patient Tolerated: Well Instructions Provided: Care of device   Braulio Bosch 03/19/2018, 10:57 AM

## 2018-03-19 NOTE — Anesthesia Procedure Notes (Signed)
Procedure Name: MAC Date/Time: 03/19/2018 7:47 AM Performed by: Colin Benton, CRNA Pre-anesthesia Checklist: Patient identified, Emergency Drugs available, Suction available and Patient being monitored Patient Re-evaluated:Patient Re-evaluated prior to induction Oxygen Delivery Method: Nasal cannula Induction Type: IV induction Placement Confirmation: positive ETCO2

## 2018-03-19 NOTE — Evaluation (Addendum)
Physical Therapy Evaluation Patient Details Name: Kimberly King MRN: [id number] DOB: [birth year] Today's Date: 03/19/2018   History of Present Illness  Pt is a 67 y/o female s/p elective L TKA. PMH includes migraines, leukemia, and cervical fusion.   Clinical Impression  Pt is s/p surgery above with deficits below. Pt tolerated gait training well requiring min to min guard A with RW. Educated about knee precautions and supine HEP. Will continue to follow acutely to maximize functional mobility independence and safety.     Follow Up Recommendations Follow surgeon's recommendation for DC plan and follow-up therapies;Supervision for mobility/OOB    Equipment Recommendations  None recommended by PT    Recommendations for Other Services       Precautions / Restrictions Precautions Precautions: Knee Precaution Booklet Issued: Yes (comment) Precaution Comments: Reviewed knee precautions with pt.  Restrictions Weight Bearing Restrictions: Yes LLE Weight Bearing: Weight bearing as tolerated      Mobility  Bed Mobility Overal bed mobility: Needs Assistance Bed Mobility: Supine to Sit     Supine to sit: Supervision     General bed mobility comments: Supervision for safety. Increased time required.   Transfers Overall transfer level: Needs assistance Equipment used: Rolling walker (2 wheeled) Transfers: Sit to/from Stand Sit to Stand: Min assist         General transfer comment: Min a for steadying assist. Demonstrated safe hand placement.   Ambulation/Gait Ambulation/Gait assistance: Min guard Ambulation Distance (Feet): 50 Feet Assistive device: Rolling walker (2 wheeled) Gait Pattern/deviations: Step-to pattern;Step-through pattern;Decreased step length - right;Decreased step length - left;Decreased weight shift to right;Antalgic Gait velocity: Decreased    General Gait Details: Slow, antalgic gait. Verbal cues for sequencing using RW. Able to progress to step  through pattern, however, continued to demonstrate limited weightshift to LLE.   Stairs            Wheelchair Mobility    Modified Rankin (Stroke Patients Only)       Balance Overall balance assessment: Needs assistance Sitting-balance support: No upper extremity supported;Feet supported Sitting balance-Leahy Scale: Good     Standing balance support: Bilateral upper extremity supported;During functional activity Standing balance-Leahy Scale: Poor Standing balance comment: Reliant on BUE support.                              Pertinent Vitals/Pain Pain Assessment: 0-10 Pain Score: 7  Pain Location: L knee  Pain Descriptors / Indicators: Aching;Operative site guarding Pain Intervention(s): Limited activity within patient's tolerance;Monitored during session;Repositioned    Home Living Family/patient expects to be discharged to:: Private residence Living Arrangements: Alone Available Help at Discharge: Family;Available 24 hours/day Type of Home: House Home Access: Stairs to enter Entrance Stairs-Rails: Psychiatric nurse of Steps: 3 Home Layout: Two level;Able to live on main level with bedroom/bathroom Home Equipment: Gilford Rile - 2 wheels;Bedside commode Additional Comments: Pt reports she will be going to stay with her cousin.     Prior Function Level of Independence: Independent               Hand Dominance   Dominant Hand: Right    Extremity/Trunk Assessment   Upper Extremity Assessment Upper Extremity Assessment: Overall WFL for tasks assessed    Lower Extremity Assessment Lower Extremity Assessment: LLE deficits/detail LLE Deficits / Details: Reports some decreased sensation. Able to perform ther ex below. Deficits consistent with post op pain and weakness.     Cervical / Trunk  Assessment Cervical / Trunk Assessment: Normal  Communication   Communication: No difficulties  Cognition Arousal/Alertness:  Awake/alert Behavior During Therapy: WFL for tasks assessed/performed Overall Cognitive Status: Within Functional Limits for tasks assessed                                        General Comments General comments (skin integrity, edema, etc.): Pt's daughter present during session.     Exercises Total Joint Exercises Ankle Circles/Pumps: AROM;Both;20 reps Quad Sets: AROM;10 reps;Left Heel Slides: AROM;10 reps;Left   Assessment/Plan    PT Assessment Patient needs continued PT services  PT Problem List Decreased strength;Decreased range of motion;Decreased balance;Decreased mobility;Decreased knowledge of use of DME;Decreased knowledge of precautions;Pain       PT Treatment Interventions DME instruction;Functional mobility training;Stair training;Gait training;Therapeutic activities;Therapeutic exercise;Balance training;Patient/family education    PT Goals (Current goals can be found in the Care Plan section)  Acute Rehab PT Goals Patient Stated Goal: to go home tomorrow  PT Goal Formulation: With patient Time For Goal Achievement: 04/02/18 Potential to Achieve Goals: Good    Frequency 7X/week   Barriers to discharge        Co-evaluation               AM-PAC PT "6 Clicks" Daily Activity  Outcome Measure Difficulty turning over in bed (including adjusting bedclothes, sheets and blankets)?: None Difficulty moving from lying on back to sitting on the side of the bed? : A Little Difficulty sitting down on and standing up from a chair with arms (e.g., wheelchair, bedside commode, etc,.)?: Unable Help needed moving to and from a bed to chair (including a wheelchair)?: A Little Help needed walking in hospital room?: A Little Help needed climbing 3-5 steps with a railing? : A Little 6 Click Score: 17    End of Session Equipment Utilized During Treatment: Gait belt Activity Tolerance: Patient tolerated treatment well Patient left: in chair;with call  bell/phone within reach;with family/visitor present Nurse Communication: Mobility status PT Visit Diagnosis: Other abnormalities of gait and mobility (R26.89);Pain Pain - Right/Left: Left Pain - part of body: Knee    Time: 2536-6440 PT Time Calculation (min) (ACUTE ONLY): 21 min   Charges:   PT Evaluation $PT Eval Low Complexity: 1 Low     PT G Codes:        Leighton Ruff, PT, DPT  Acute Rehabilitation Services  Pager: [phone]   Rudean Hitt 03/19/2018, 6:15 PM

## 2018-03-19 NOTE — Anesthesia Procedure Notes (Signed)
Spinal  Patient location during procedure: OR Start time: 03/19/2018 7:40 AM End time: 03/19/2018 7:49 AM Staffing Anesthesiologist: Myrtie Soman, MD Performed: anesthesiologist  Preanesthetic Checklist Completed: patient identified, site marked, surgical consent, pre-op evaluation, timeout performed, IV checked, risks and benefits discussed and monitors and equipment checked Spinal Block Patient position: sitting Prep: Betadine Patient monitoring: heart rate, continuous pulse ox and blood pressure Location: L4-5 Injection technique: single-shot Needle Needle type: Sprotte  Needle gauge: 24 G Needle length: 9 cm Additional Notes Expiration date of kit checked and confirmed. Patient tolerated procedure well, without complications.

## 2018-03-19 NOTE — Op Note (Signed)
Total Knee Arthroplasty Procedure Note  Preoperative diagnosis: Left knee osteoarthritis  Postoperative diagnosis:same  Operative procedure: Left total knee arthroplasty. CPT [phone]  Surgeon: N. Eduard Roux, MD  Assist: Madalyn Rob, PA-C; necessary for the timely completion of procedure and due to complexity of procedure.  Anesthesia: Spinal, regional  Tourniquet time: 60 mins  Implants used: [employer]ker Triatholon Femur: PS 5 Tibia: 4 Patella: 29 mm Polyethylene: 11 mm  Indication: Kimberly King is a 67 y.o. year old female with a history of knee pain. Having failed conservative management, the patient elected to proceed with a total knee arthroplasty.  We have reviewed the risk and benefits of the surgery and they elected to proceed after voicing understanding.  Procedure:  After informed consent was obtained and understanding of the risk were voiced including but not limited to bleeding, infection, damage to surrounding structures including nerves and vessels, blood clots, leg length inequality and the failure to achieve desired results, the operative extremity was marked with verbal confirmation of the patient in the holding area.   The patient was then brought to the operating room and transported to the operating room table in the supine position.  A tourniquet was applied to the operative extremity around the upper thigh. The operative limb was then prepped and draped in the usual sterile fashion and preoperative antibiotics were administered.  A time out was performed prior to the start of surgery confirming the correct extremity, preoperative antibiotic administration, as well as team members, implants and instruments available for the case. Correct surgical site was also confirmed with preoperative radiographs. The limb was then elevated for exsanguination and the tourniquet was inflated. A midline incision was made and a standard medial parapatellar approach was  performed.  The patella was prepared and sized to a 29 mm.  A cover was placed on the patella for protection from retractors.  We then turned our attention to the femur. Posterior cruciate ligament was sacrificed. Start site was drilled in the femur and the intramedullary distal femoral cutting guide was placed, set at 5 degrees valgus, taking 9 mm of distal resection. The distal cut was made. Osteophytes were then removed. Next, the proximal tibial cutting guide was placed with appropriate slope, varus/valgus alignment and depth of resection. The proximal tibial cut was made. Gap blocks were then used to assess the extension gap and alignment, and appropriate soft tissue releases were performed. Attention was turned back to the femur, which was sized using the sizing guide to a size 5. Appropriate rotation of the femoral component was determined using epicondylar axis, Whiteside's line, and assessing the flexion gap under ligament tension. The appropriate size 4-in-1 cutting block was placed and cuts were made. Posterior femoral osteophytes and uncapped bone were then removed with the curved osteotome. The tibia was sized for a size 4 component. The femoral box-cutting guide was placed and prepared for a PS femoral component. Trial components were placed, and stability was checked in full extension, mid-flexion, and deep flexion. Proper tibial rotation was determined and marked.  The patella tracked well without a lateral release. Trial components were then removed and tibial preparation performed. A posterior capsular injection comprising of 20 cc of 1.3% exparel and 40 cc of normal saline was performed for postoperative pain control. The bony surfaces were irrigated with a pulse lavage and then dried. Bone cement was vacuum mixed on the back table, and the final components sized above were cemented into place. After cement had finished  curing, excess cement was removed. The stability of the construct was  re-evaluated throughout a range of motion and found to be acceptable. The trial liner was removed, the knee was copiously irrigated, and the knee was re-evaluated for any excess bone debris. The real polyethylene liner, 11 mm thick, was inserted and checked to ensure the locking mechanism had engaged appropriately. The tourniquet was deflated and hemostasis was achieved. The wound was irrigated with normal saline.  One gram of vancomycin powder was placed in the surgical bed. Capsular closure was performed with a #1 vicryl, subcutaneous fat closed with a 0 vicryl suture, then subcutaneous tissue closed with interrupted 2.0 vicryl suture. The skin was then closed with a 3.0 monocryl. A sterile dressing was applied.  The patient was awakened in the operating room and taken to recovery in stable condition. All sponge, needle, and instrument counts were correct at the end of the case.  Position: supine  Complications: none.  Time Out: performed   Drains/Packing: none  Estimated blood loss: minimal  Returned to Recovery Room: in good condition.   Antibiotics: yes   Mechanical VTE (DVT) Prophylaxis: sequential compression devices, TED thigh-high  Chemical VTE (DVT) Prophylaxis: aspirin  Fluid Replacement  Crystalloid: see anesthesia record Blood: none  FFP: none   Specimens Removed: 1 to pathology   Sponge and Instrument Count Correct? yes   PACU: portable radiograph - knee AP and Lateral   Admission: inpatient status  Plan/RTC: Return in 2 weeks for wound check.   Weight Bearing/Load Lower Extremity: full   N. Eduard Roux, MD [place] 9:14 AM

## 2018-03-19 NOTE — Anesthesia Postprocedure Evaluation (Signed)
Anesthesia Post Note  Patient: Kimberly King  Procedure(s) Performed: LEFT TOTAL KNEE ARTHROPLASTY (Left Knee)     Patient location during evaluation: PACU Anesthesia Type: Spinal Level of consciousness: oriented and awake and alert Pain management: pain level controlled Vital Signs Assessment: post-procedure vital signs reviewed and stable Respiratory status: spontaneous breathing, respiratory function stable and patient connected to nasal cannula oxygen Cardiovascular status: blood pressure returned to baseline and stable Postop Assessment: no headache, no backache and no apparent nausea or vomiting Anesthetic complications: no    Last Vitals:  Vitals:   03/19/18 1038 03/19/18 1049  BP: (!) 147/97 (!) 159/80  Pulse: 64 68  Resp: 11 14  Temp:  (!) 36.3 C  SpO2: 98% 97%    Last Pain:  Vitals:   03/19/18 1049  TempSrc:   PainSc: 4     LLE Motor Response: Purposeful movement;Responds to commands (03/19/18 1049) LLE Sensation: Decreased (03/19/18 1049) RLE Motor Response: Purposeful movement;Responds to commands (03/19/18 1049) RLE Sensation: Decreased (03/19/18 1049) L Sensory Level: L4-Anterior knee, lower leg (03/19/18 1049) R Sensory Level: L5-Outer lower leg, top of foot, great toe (03/19/18 1049)  Carma Dwiggins S

## 2018-03-19 NOTE — Anesthesia Procedure Notes (Signed)
Anesthesia Regional Block: Adductor canal block   Pre-Anesthetic Checklist: ,, timeout performed, Correct Patient, Correct Site, Correct Laterality, Correct Procedure, Correct Position, site marked, Risks and benefits discussed,  Surgical consent,  Pre-op evaluation,  At surgeon's request and post-op pain management  Laterality: Left  Prep: chloraprep       Needles:  Injection technique: Single-shot  Needle Type: Echogenic Needle     Needle Length: 9cm      Additional Needles:   Procedures:,,,, ultrasound used (permanent image in chart),,,,  Narrative:  Start time: 03/19/2018 6:55 AM End time: 03/19/2018 7:03 AM Injection made incrementally with aspirations every 5 mL.  Performed by: Personally  Anesthesiologist: Myrtie Soman, MD  Additional Notes: Patient tolerated the procedure well without complications

## 2018-03-19 NOTE — Anesthesia Procedure Notes (Signed)
Anesthesia Procedure Image    

## 2018-03-19 NOTE — Transfer of Care (Signed)
Immediate Anesthesia Transfer of Care Note  Patient: Kimberly King  Procedure(s) Performed: LEFT TOTAL KNEE ARTHROPLASTY (Left Knee)  Patient Location: PACU  Anesthesia Type:Spinal and MAC combined with regional for post-op pain  Level of Consciousness: awake, alert , oriented and patient cooperative  Airway & Oxygen Therapy: Patient Spontanous Breathing and Patient connected to face mask oxygen  Post-op Assessment: Report given to RN and Post -op Vital signs reviewed and stable  Post vital signs: Reviewed and stable  Last Vitals:  Vitals Value Taken Time  BP 126/88 03/19/2018  9:53 AM  Temp    Pulse 73 03/19/2018  9:55 AM  Resp 11 03/19/2018  9:55 AM  SpO2 100 % 03/19/2018  9:55 AM  Vitals shown include unvalidated device data.  Last Pain:  Vitals:   03/19/18 0611  TempSrc: Oral         Complications: No apparent anesthesia complications

## 2018-03-19 NOTE — Anesthesia Preprocedure Evaluation (Signed)
Anesthesia Evaluation  Patient identified by MRN, date of birth, ID band Patient awake    Reviewed: Allergy & Precautions, NPO status , Patient's Chart, lab work & pertinent test results  Airway Mallampati: II  TM Distance: >3 FB Neck ROM: Full    Dental no notable dental hx.    Pulmonary neg pulmonary ROS, former smoker,    Pulmonary exam normal breath sounds clear to auscultation       Cardiovascular negative cardio ROS Normal cardiovascular exam Rhythm:Regular Rate:Normal     Neuro/Psych negative neurological ROS  negative psych ROS   GI/Hepatic negative GI ROS, Neg liver ROS,   Endo/Other  negative endocrine ROSHyperthyroidism   Renal/GU negative Renal ROS  negative genitourinary   Musculoskeletal  (+) Arthritis , Osteoarthritis,    Abdominal   Peds negative pediatric ROS (+)  Hematology negative hematology ROS (+)   Anesthesia Other Findings   Reproductive/Obstetrics negative OB ROS                             Anesthesia Physical Anesthesia Plan  ASA: II  Anesthesia Plan: Spinal   Post-op Pain Management:  Regional for Post-op pain   Induction: Intravenous  PONV Risk Score and Plan: 2 and Ondansetron and Dexamethasone  Airway Management Planned: Simple Face Mask  Additional Equipment:   Intra-op Plan:   Post-operative Plan:   Informed Consent: I have reviewed the patients History and Physical, chart, labs and discussed the procedure including the risks, benefits and alternatives for the proposed anesthesia with the patient or authorized representative who has indicated his/her understanding and acceptance.   Dental advisory given  Plan Discussed with: CRNA and Surgeon  Anesthesia Plan Comments:         Anesthesia Quick Evaluation

## 2018-03-20 ENCOUNTER — Encounter (HOSPITAL_COMMUNITY): Payer: Self-pay | Admitting: Orthopaedic Surgery

## 2018-03-20 DIAGNOSIS — K219 Gastro-esophageal reflux disease without esophagitis: Secondary | ICD-10-CM | POA: Diagnosis not present

## 2018-03-20 DIAGNOSIS — M1712 Unilateral primary osteoarthritis, left knee: Secondary | ICD-10-CM | POA: Diagnosis not present

## 2018-03-20 DIAGNOSIS — G43909 Migraine, unspecified, not intractable, without status migrainosus: Secondary | ICD-10-CM | POA: Diagnosis not present

## 2018-03-20 DIAGNOSIS — Z856 Personal history of leukemia: Secondary | ICD-10-CM | POA: Diagnosis not present

## 2018-03-20 DIAGNOSIS — Z87891 Personal history of nicotine dependence: Secondary | ICD-10-CM | POA: Diagnosis not present

## 2018-03-20 DIAGNOSIS — Z79899 Other long term (current) drug therapy: Secondary | ICD-10-CM | POA: Diagnosis not present

## 2018-03-20 LAB — BASIC METABOLIC PANEL
Anion gap: 5 (ref 5–15)
BUN: 11 mg/dL (ref 6–20)
CALCIUM: 8.6 mg/dL — AB (ref 8.9–10.3)
CO2: 26 mmol/L (ref 22–32)
Chloride: 109 mmol/L (ref 101–111)
Creatinine, Ser: 0.69 mg/dL (ref 0.44–1.00)
GFR calc Af Amer: >60 mL/min (ref >60)
GFR calc non Af Amer: >60 mL/min (ref >60)
GLUCOSE: 117 mg/dL — AB (ref 65–99)
Potassium: 3.7 mmol/L (ref 3.5–5.1)
Sodium: 140 mmol/L (ref 135–145)

## 2018-03-20 LAB — CBC
HCT: 30.5 % — ABNORMAL LOW (ref 36.0–46.0)
Hemoglobin: 9.6 g/dL — ABNORMAL LOW (ref 12.0–15.0)
MCH: 28.2 pg (ref 26.0–34.0)
MCHC: 31.5 g/dL (ref 30.0–36.0)
MCV: 89.4 fL (ref 78.0–100.0)
Platelets: 316 10*3/uL (ref 150–400)
RBC: 3.41 MIL/uL — ABNORMAL LOW (ref 3.87–5.11)
RDW: 15.2 % (ref 11.5–15.5)
WBC: 18.9 10*3/uL — AB (ref 4.0–10.5)

## 2018-03-20 NOTE — Care Management Note (Signed)
Case Management Note  Patient Details  Name: Kimberly King MRN: [id number] Date of Birth: [birth year]  Subjective/Objective:                    Action/Plan:  Spoke to patient at bedside. She has a walker, commode seat riser and shower stool at home already, she does not want 3 in 1 .  Patient plans to go to her cousins home at discharge : [address] [place] until Saturday March 25, 2018 and go to her home address in [facility] [place]). Cell [phone]. Patient's cousin willing to drive her home if needed for HHPT.  Spoke with Tiffany with [facility]. She will call [facility] [place] office who arranges HHPT for [place] and try to arrange a HHPT for cousin's address and a HHPT for patient's address. Tiffany will call patient directly. Expected Discharge Date:  03/20/18               Expected Discharge Plan:  [place]  In-House Referral:     Discharge planning Services  CM Consult  Post Acute Care Choice:  Home Health, Durable Medical Equipment Choice offered to:  Patient  DME Arranged:  N/A DME Agency:  NA  HH Arranged:  PT [place]:  [facility] (now [facility])  Status of Service:  Completed, signed off  If discussed at [place] of Stay Meetings, dates discussed:    Additional Comments:  Marilu Favre, RN 03/20/2018, 10:15 AM

## 2018-03-20 NOTE — Progress Notes (Signed)
Physical Therapy Treatment Patient Details Name: Kimberly King MRN: [id number] DOB: 12/11/1950 Today's Date: 03/20/2018    History of Present Illness Pt is a 67 y/o female s/p elective L TKA. PMH includes migraines, leukemia, and cervical fusion.     PT Comments    Pt reviewed gait and stair training with cousin present to ensure safe entry into home this pm.  Pt also reviewed supine and seated HEP and re-educated on correct technique for safe entry into home.  Pt will d/c after nurse reviews paperwork.  She is safe to d/c home from a mobility stand point.    Follow Up Recommendations  Follow surgeon's recommendation for DC plan and follow-up therapies;Supervision for mobility/OOB     Equipment Recommendations  None recommended by PT    Recommendations for Other Services       Precautions / Restrictions Precautions Precautions: Knee Precaution Booklet Issued: Yes (comment) Precaution Comments: Reviewed knee precautions with pt.  Restrictions Weight Bearing Restrictions: Yes LLE Weight Bearing: Weight bearing as tolerated    Mobility  Bed Mobility Overal bed mobility: Modified Independent Bed Mobility: Supine to Sit     Supine to sit: Modified independent (Device/Increase time)     General bed mobility comments: Pt performed without assistance.    Transfers Overall transfer level: Needs assistance Equipment used: Rolling walker (2 wheeled) Transfers: Sit to/from Stand Sit to Stand: Supervision         General transfer comment: Pt remains to stand impulsively.    Ambulation/Gait Ambulation/Gait assistance: Supervision Ambulation Distance (Feet): 300 Feet Assistive device: Rolling walker (2 wheeled) Gait Pattern/deviations: Step-through pattern;Decreased step length - right;Decreased step length - left;Decreased weight shift to right;Antalgic Gait velocity: Decreased    General Gait Details: Pt with mild instability in L knee,  Cues for upper trunk control  and safety with hand placement when RW is moving.     Stairs Stairs: Yes Stairs assistance: Min assist Stair Management: No rails;With walker;Backwards Number of Stairs: 2 General stair comments: Cues for sequencing and RW placement, cousin present to observe and participate in stair negotiation.  Handout issued for clarity at home.     Wheelchair Mobility    Modified Rankin (Stroke Patients Only)       Balance Overall balance assessment: Needs assistance Sitting-balance support: No upper extremity supported;Feet supported Sitting balance-Leahy Scale: Good     Standing balance support: Bilateral upper extremity supported;During functional activity Standing balance-Leahy Scale: Fair                              Cognition Arousal/Alertness: Awake/alert Behavior During Therapy: WFL for tasks assessed/performed Overall Cognitive Status: Within Functional Limits for tasks assessed                                        Exercises Total Joint Exercises Ankle Circles/Pumps: AROM;Both;20 reps;Supine Quad Sets: AROM;10 reps;Left;Supine Towel Squeeze: AROM;Both;10 reps;Supine Short Arc Quad: AROM;Left;10 reps;Supine Heel Slides: AROM;Left;10 reps;Supine Hip ABduction/ADduction: AROM;Left;10 reps;Supine Straight Leg Raises: AROM;Left;10 reps;Supine Long Arc Quad: AROM;Left;10 reps;Seated    General Comments        Pertinent Vitals/Pain Pain Assessment: 0-10 Pain Score: 4  Pain Location: L knee  Pain Descriptors / Indicators: Aching;Operative site guarding Pain Intervention(s): Monitored during session;Repositioned;Ice applied    Home Living  Prior Function            PT Goals (current goals can now be found in the care plan section) Acute Rehab PT Goals Patient Stated Goal: to go home tomorrow  Potential to Achieve Goals: Good Progress towards PT goals: Progressing toward goals    Frequency     7X/week      PT Plan Current plan remains appropriate    Co-evaluation              AM-PAC PT "6 Clicks" Daily Activity  Outcome Measure  Difficulty turning over in bed (including adjusting bedclothes, sheets and blankets)?: None Difficulty moving from lying on back to sitting on the side of the bed? : None Difficulty sitting down on and standing up from a chair with arms (e.g., wheelchair, bedside commode, etc,.)?: A Little Help needed moving to and from a bed to chair (including a wheelchair)?: A Little Help needed walking in hospital room?: A Little Help needed climbing 3-5 steps with a railing? : A Little 6 Click Score: 20    End of Session Equipment Utilized During Treatment: Gait belt Activity Tolerance: Patient tolerated treatment well Patient left: in chair;with call bell/phone within reach;with family/visitor present Nurse Communication: Mobility status PT Visit Diagnosis: Other abnormalities of gait and mobility (R26.89);Pain Pain - Right/Left: Left Pain - part of body: Knee     Time: 4562-5638 PT Time Calculation (min) (ACUTE ONLY): 16 min  Charges:  $Gait Training: 8-22 mins                    G Codes:       Governor Rooks, PTA pager [phone]    Cristela Blue 03/20/2018, 2:01 PM

## 2018-03-20 NOTE — Care Management Obs Status (Signed)
[place] NOTIFICATION   Patient Details  Name: KARMEL PATRICELLI MRN: [id number] Date of Birth: 1950/12/30   Medicare Observation Status Notification Given:  Yes    Marilu Favre, RN 03/20/2018, 2:19 PM

## 2018-03-20 NOTE — Progress Notes (Signed)
Physical Therapy Treatment Patient Details Name: Kimberly King MRN: [id number] DOB: [birth year] Today's Date: 03/20/2018    History of Present Illness Pt is a 67 y/o female s/p elective L TKA. PMH includes migraines, leukemia, and cervical fusion.     PT Comments    Pt is progressing well this am.  Performed stair training both with and without rails to ensure safe entry into home.  Pt will require pm session per ortho PA request.  Plan for return to cousin's home for the 1st week then she plans to return to her home.  Informed nurse CM that patient was inquiring about HHPT is two separate locations.  CM reports she will check into this and inform patient.     Follow Up Recommendations  Follow surgeon's recommendation for DC plan and follow-up therapies;Supervision for mobility/OOB     Equipment Recommendations  None recommended by PT    Recommendations for Other Services       Precautions / Restrictions Precautions Precautions: Knee Precaution Booklet Issued: Yes (comment) Precaution Comments: Reviewed knee precautions with pt.  Restrictions Weight Bearing Restrictions: Yes LLE Weight Bearing: Weight bearing as tolerated    Mobility  Bed Mobility               General bed mobility comments: Pt in recliner on arrival   Transfers Overall transfer level: Needs assistance Equipment used: Rolling walker (2 wheeled) Transfers: Sit to/from Stand Sit to Stand: Supervision         General transfer comment: Supervision for safety, patient stood impulsively on 1st attempt but safe with technique and no LOB.    Ambulation/Gait Ambulation/Gait assistance: Min guard Ambulation Distance (Feet): 200 Feet Assistive device: Rolling walker (2 wheeled) Gait Pattern/deviations: Step-through pattern;Decreased step length - right;Decreased step length - left;Decreased weight shift to right;Antalgic Gait velocity: Decreased    General Gait Details: Pt with mild instability in L  knee,  Cues for upper trunk control and safety with hand placement when RW is moving.     Stairs Stairs: Yes Stairs assistance: Min assist;Supervision Stair Management: No rails;Two rails;Forwards;Backwards Number of Stairs: 4(x2 forwards with B rails and x2 backwards with RW and no rails. ) General stair comments: Cues for sequencing.  Pt required min assistance with RW and Supervision with B rails.  Cues for RW placement.  Min assistance to keep RW steady.     Wheelchair Mobility    Modified Rankin (Stroke Patients Only)       Balance Overall balance assessment: Needs assistance Sitting-balance support: No upper extremity supported;Feet supported Sitting balance-Leahy Scale: Good       Standing balance-Leahy Scale: Fair                              Cognition Arousal/Alertness: Awake/alert Behavior During Therapy: WFL for tasks assessed/performed Overall Cognitive Status: Within Functional Limits for tasks assessed                                        Exercises Total Joint Exercises Ankle Circles/Pumps: AROM;Both;20 reps;Supine Quad Sets: AROM;10 reps;Left;Supine Towel Squeeze: AROM;Both;10 reps;Supine Short Arc Quad: AROM;Left;10 reps;Supine Heel Slides: AROM;Left;10 reps;Supine Hip ABduction/ADduction: AROM;Left;10 reps;Supine Straight Leg Raises: AROM;Left;10 reps;Supine Long Arc Quad: AROM;Left;10 reps;Seated Goniometric ROM: 60 degrees flexion in L knee.      General Comments  Pertinent Vitals/Pain Pain Assessment: 0-10 Pain Score: 4  Pain Location: L knee  Pain Descriptors / Indicators: Aching;Operative site guarding Pain Intervention(s): Monitored during session;Repositioned;Ice applied    Home Living                      Prior Function            PT Goals (current goals can now be found in the care plan section) Acute Rehab PT Goals Patient Stated Goal: to go home tomorrow  Potential to Achieve  Goals: Good Progress towards PT goals: Progressing toward goals    Frequency    7X/week      PT Plan Current plan remains appropriate    Co-evaluation              AM-PAC PT "6 Clicks" Daily Activity  Outcome Measure  Difficulty turning over in bed (including adjusting bedclothes, sheets and blankets)?: None Difficulty moving from lying on back to sitting on the side of the bed? : None Difficulty sitting down on and standing up from a chair with arms (e.g., wheelchair, bedside commode, etc,.)?: A Little Help needed moving to and from a bed to chair (including a wheelchair)?: A Little Help needed walking in hospital room?: A Little Help needed climbing 3-5 steps with a railing? : A Little 6 Click Score: 20    End of Session Equipment Utilized During Treatment: Gait belt Activity Tolerance: Patient tolerated treatment well Patient left: in chair;with call bell/phone within reach;with family/visitor present Nurse Communication: Mobility status PT Visit Diagnosis: Other abnormalities of gait and mobility (R26.89);Pain Pain - Right/Left: Left Pain - part of body: Knee     Time: 2633-3545 PT Time Calculation (min) (ACUTE ONLY): 18 min  Charges:  $Gait Training: 8-22 mins                    G Codes:       Governor Rooks, PTA pager [phone]    Cristela Blue 03/20/2018, 9:55 AM

## 2018-03-20 NOTE — Plan of Care (Signed)
  Problem: Pain Managment: Goal: General experience of comfort will improve Outcome: Adequate for Discharge   Problem: Safety: Goal: Ability to remain free from injury will improve Outcome: Adequate for Discharge   

## 2018-03-20 NOTE — Progress Notes (Signed)
Subjective: 1 Day Post-Op Procedure(s) (LRB): LEFT TOTAL KNEE ARTHROPLASTY (Left) Patient reports pain as mild.  Doing great this am.  Objective: Vital signs in last 24 hours: Temp:  [97.3 F (36.3 C)-98.2 F (36.8 C)] 98.1 F (36.7 C) (06/11 0433) Pulse Rate:  [64-72] 72 (06/11 0433) Resp:  [11-18] 18 (06/11 0433) BP: (105-160)/(58-97) 127/58 (06/11 0433) SpO2:  [97 %-100 %] 98 % (06/11 0433)  Intake/Output from previous day: 06/10 0701 - 06/11 0700 In: 4497.4 [P.O.:592; I.V.:3285.4; IV Piggyback:620] Out: 425 [Urine:400; Blood:25] Intake/Output this shift: No intake/output data recorded.  Recent Labs    03/20/18 0422  HGB 9.6*   Recent Labs    03/20/18 0422  WBC 18.9*  RBC 3.41*  HCT 30.5*  PLT 316   Recent Labs    03/20/18 0422  NA 140  K 3.7  CL 109  CO2 26  BUN 11  CREATININE 0.69  GLUCOSE 117*  CALCIUM 8.6*   No results for input(s): LABPT, INR in the last 72 hours.  Neurologically intact Neurovascular intact Sensation intact distally Intact pulses distally Dorsiflexion/Plantar flexion intact Incision: dressing C/D/I No cellulitis present Compartment soft  Anticipated LOS equal to or greater than 2 midnights due to - Age 66 and older with one or more of the following:  - Obesity  - Expected need for hospital services (PT, OT, Nursing) required for safe  discharge  - Anticipated need for postoperative skilled nursing care or inpatient rehab  - Active co-morbidities: None OR   - Unanticipated findings during/Post Surgery: None  - Patient is a high risk of re-admission due to: None   Assessment/Plan: 1 Day Post-Op Procedure(s) (LRB): LEFT TOTAL KNEE ARTHROPLASTY (Left) Advance diet Up with therapy D/C IV fluids Discharge home with home health after second PT session today WBAT LLE PLEASE APPLY TED HOSE TO BLE  ABLA-mild and stable    Aundra Dubin 03/20/2018, 7:35 AM

## 2018-03-20 NOTE — Discharge Summary (Signed)
Patient ID: Kimberly King MRN: [id number] DOB/AGE: 14-Jan-1951 67 y.o.  Admit date: 03/19/2018 Discharge date: 03/20/2018  Admission Diagnoses:  Active Problems:   Total knee replacement status   Discharge Diagnoses:  Same  Past Medical History:  Diagnosis Date  . Allergy   . Anxiety   . Arthritis    knees hips neck  . Cancer [facility])    leukemia dx 06/2013 - no current treatment - being monitored  . Cataract    bilateral - being monitored, no treatment currently  . Depression   . GERD (gastroesophageal reflux disease)   . Leukocytosis    and low blood sodium   . Migraines   . Smoker   . Thyroid nodule    JUST WATCHING    Surgeries: Procedure(s): LEFT TOTAL KNEE ARTHROPLASTY on 03/19/2018   Consultants:   Discharged Condition: Improved  Hospital Course: Kimberly King is an 67 y.o. female who was admitted 03/19/2018 for operative treatment of primary localized osteoarthritis left knee. Patient has severe unremitting pain that affects sleep, daily activities, and work/hobbies. After pre-op clearance the patient was taken to the operating room on 03/19/2018 and underwent  Procedure(s): LEFT TOTAL KNEE ARTHROPLASTY.    Patient was given perioperative antibiotics:  Anti-infectives (From admission, onward)   Start     Dose/Rate Route Frequency Ordered Stop   03/19/18 1330  ceFAZolin (ANCEF) IVPB 2g/100 mL premix     2 g 200 mL/hr over 30 Minutes Intravenous Every 6 hours 03/19/18 1108 03/20/18 0344   03/19/18 0600  ceFAZolin (ANCEF) IVPB 2g/100 mL premix     2 g 200 mL/hr over 30 Minutes Intravenous On call to O.R. 03/19/18 0554 03/19/18 0745       Patient was given sequential compression devices, early ambulation, and chemoprophylaxis to prevent DVT.  Patient benefited maximally from hospital stay and there were no complications.    Recent vital signs:  Patient Vitals for the past 24 hrs:  BP Temp Temp src Pulse Resp SpO2  03/20/18 0433 (!) 127/58 98.1 F (36.7 C)  Oral 72 18 98 %  03/19/18 2340 110/76 98.2 F (36.8 C) Oral 68 18 98 %  03/19/18 2100 105/61 97.9 F (36.6 C) Oral 69 18 97 %  03/19/18 1105 (!) 160/95 97.7 F (36.5 C) Oral 69 - 100 %  03/19/18 1049 (!) 159/80 (!) 97.3 F (36.3 C) - 68 14 97 %  03/19/18 1038 (!) 147/97 - - 64 11 98 %  03/19/18 1023 (!) 151/87 - - 67 12 100 %  03/19/18 1008 (!) 131/92 - - 67 14 97 %  03/19/18 0953 126/88 98.2 F (36.8 C) - 69 13 99 %     Recent laboratory studies:  Recent Labs    03/20/18 0422  WBC 18.9*  HGB 9.6*  HCT 30.5*  PLT 316  NA 140  K 3.7  CL 109  CO2 26  BUN 11  CREATININE 0.69  GLUCOSE 117*  CALCIUM 8.6*     Discharge Medications:   Allergies as of 03/20/2018   No Known Allergies     Medication List    STOP taking these medications   acetaminophen 500 MG tablet Commonly known as:  TYLENOL   diclofenac 75 MG EC tablet Commonly known as:  VOLTAREN   Fish Oil 1200 MG Caps   traMADol 50 MG tablet Commonly known as:  ULTRAM   Turmeric Curcumin 500 MG Caps     TAKE these medications   alum &  mag hydroxide-simeth 371-062-69 MG/5ML suspension Commonly known as:  MAALOX PLUS Take 10 mLs by mouth at bedtime as needed for indigestion.   aspirin EC 81 MG tablet Take 1 tablet (81 mg total) by mouth 2 (two) times daily.   clobetasol ointment 0.05 % Commonly known as:  TEMOVATE Apply 1 application topically 2 (two) times a week.   conjugated estrogens vaginal cream Commonly known as:  PREMARIN Place 1 Applicatorful vaginally daily.   cyanocobalamin 2000 MCG tablet Take 2,000 mcg by mouth daily.   DENTA 5000 PLUS 1.1 % Crea dental cream Generic drug:  sodium fluoride Place 1 application onto teeth at bedtime. brush teeth for 2 minutes and spit out do not rinse   diphenhydrAMINE-PE-APAP 25-5-325 MG Tabs Take 1 tablet by mouth daily as needed (for allergies).   fluticasone 50 MCG/ACT nasal spray Commonly known as:  FLONASE Place 1 spray into both nostrils  daily as needed for allergies or rhinitis.   glucosamine-chondroitin 500-400 MG tablet Take 1 tablet by mouth daily.   lidocaine 4 % cream Commonly known as:  LMX Apply 1 application topically 2 (two) times daily as needed (for pain).   LUTEIN-ZEAXANTHIN PO Take 1 tablet by mouth daily.   methocarbamol 750 MG tablet Commonly known as:  ROBAXIN Take 1 tablet (750 mg total) by mouth 2 (two) times daily as needed for muscle spasms.   ondansetron 4 MG tablet Commonly known as:  ZOFRAN Take 1-2 tablets (4-8 mg total) by mouth every 8 (eight) hours as needed for nausea or vomiting.   oxyCODONE 10 mg 12 hr tablet Commonly known as:  OXYCONTIN Take 1 tablet (10 mg total) by mouth every 12 (twelve) hours for 3 days.   oxyCODONE 5 MG immediate release tablet Commonly known as:  Oxy IR/ROXICODONE Take 1-3 tablets (5-15 mg total) by mouth every 4 (four) hours as needed.   pantoprazole 40 MG tablet Commonly known as:  PROTONIX Take 1 tablet (40 mg total) by mouth daily.   promethazine 25 MG tablet Commonly known as:  PHENERGAN Take 1 tablet (25 mg total) by mouth every 6 (six) hours as needed for nausea.   ranitidine 150 MG tablet Commonly known as:  ZANTAC Take 1 tablet (150 mg total) by mouth at bedtime.   senna-docusate 8.6-50 MG tablet Commonly known as:  SENOKOT S Take 1 tablet by mouth at bedtime as needed.   SUMAtriptan 50 MG tablet Commonly known as:  IMITREX Take 50 mg by mouth every 2 (two) hours as needed for migraine.   SYSTANE OP Place 1 drop into both eyes 2 (two) times daily.   tiZANidine 4 MG tablet Commonly known as:  ZANAFLEX Take 1 tablet (4 mg total) by mouth every 6 (six) hours as needed for muscle spasms.   XIIDRA 5 % Soln Generic drug:  Lifitegrast Place 1 drop into both eyes 2 (two) times a week.            Durable Medical Equipment  (From admission, onward)        Start     Ordered   03/19/18 1108  DME Walker rolling  Once     Question:  Patient needs a walker to treat with the following condition  Answer:  Total knee replacement status   03/19/18 1108   03/19/18 1108  DME 3 n 1  Once     03/19/18 1108   03/19/18 1108  DME Bedside commode  Once    Question:  Patient needs  a bedside commode to treat with the following condition  Answer:  Total knee replacement status   03/19/18 1108      Diagnostic Studies: Dg Chest 2 View  Result Date: 03/13/2018 CLINICAL DATA:  Preop total knee replacement EXAM: CHEST - 2 VIEW COMPARISON:  None. FINDINGS: Heart and mediastinal contours are within normal limits. No focal opacities or effusions. No acute bony abnormality. IMPRESSION: No active cardiopulmonary disease. Electronically Signed   By: Rolm Baptise M.D.   On: 03/13/2018 10:25   Dg Knee Left Port  Result Date: 03/19/2018 CLINICAL DATA:  Left knee replacement EXAM: PORTABLE LEFT KNEE - 1-2 VIEW COMPARISON:  04/30/2016 FINDINGS: Changes of left knee replacement. Soft tissue and joint space gas noted. No hardware or bony complicating feature. IMPRESSION: Left knee replacement.  No visible complicating feature. Electronically Signed   By: Rolm Baptise M.D.   On: 03/19/2018 10:22    Disposition: Discharge disposition: 01-Home or Self Care         Follow-up Information    Leandrew Koyanagi, MD In 2 weeks.   Specialty:  Orthopedic Surgery Why:  For suture removal, For wound re-check Contact information: [place] [address] [phone]            Signed: Aundra Dubin 03/20/2018, 7:38 AM

## 2018-03-20 NOTE — Progress Notes (Signed)
Pt given discharge instructions and prescriptions. Instructions gone over with her and cousin present. Answered all questions to satisfaction. All belongings gathered to be sent home. Pt in no distress at time of discharge.

## 2018-03-21 DIAGNOSIS — C9190 Lymphoid leukemia, unspecified not having achieved remission: Secondary | ICD-10-CM | POA: Diagnosis not present

## 2018-03-21 DIAGNOSIS — M47812 Spondylosis without myelopathy or radiculopathy, cervical region: Secondary | ICD-10-CM | POA: Diagnosis not present

## 2018-03-21 DIAGNOSIS — G43909 Migraine, unspecified, not intractable, without status migrainosus: Secondary | ICD-10-CM | POA: Diagnosis not present

## 2018-03-21 DIAGNOSIS — M1611 Unilateral primary osteoarthritis, right hip: Secondary | ICD-10-CM | POA: Diagnosis not present

## 2018-03-21 DIAGNOSIS — M1711 Unilateral primary osteoarthritis, right knee: Secondary | ICD-10-CM | POA: Diagnosis not present

## 2018-03-21 DIAGNOSIS — Z471 Aftercare following joint replacement surgery: Secondary | ICD-10-CM | POA: Diagnosis not present

## 2018-03-23 ENCOUNTER — Telehealth (INDEPENDENT_AMBULATORY_CARE_PROVIDER_SITE_OTHER): Payer: Self-pay | Admitting: Orthopaedic Surgery

## 2018-03-23 DIAGNOSIS — C9190 Lymphoid leukemia, unspecified not having achieved remission: Secondary | ICD-10-CM | POA: Diagnosis not present

## 2018-03-23 DIAGNOSIS — M47812 Spondylosis without myelopathy or radiculopathy, cervical region: Secondary | ICD-10-CM | POA: Diagnosis not present

## 2018-03-23 DIAGNOSIS — M1711 Unilateral primary osteoarthritis, right knee: Secondary | ICD-10-CM | POA: Diagnosis not present

## 2018-03-23 DIAGNOSIS — Z471 Aftercare following joint replacement surgery: Secondary | ICD-10-CM | POA: Diagnosis not present

## 2018-03-23 DIAGNOSIS — M1611 Unilateral primary osteoarthritis, right hip: Secondary | ICD-10-CM | POA: Diagnosis not present

## 2018-03-23 DIAGNOSIS — G43909 Migraine, unspecified, not intractable, without status migrainosus: Secondary | ICD-10-CM | POA: Diagnosis not present

## 2018-03-23 NOTE — Telephone Encounter (Signed)
Please advise. Total knee 03/19/18.

## 2018-03-23 NOTE — Telephone Encounter (Signed)
Patient left a vm message stating that she had a fever of 100.4 when her PT was at her home.  CB#[phone]

## 2018-03-24 NOTE — Telephone Encounter (Signed)
Ok thanks.  Have her take tylenol.  It's not abnormal to get a low grade fever after surgery.  If she has persistent fevers especially above 101.5, she needs to let us know.

## 2018-03-26 ENCOUNTER — Telehealth (INDEPENDENT_AMBULATORY_CARE_PROVIDER_SITE_OTHER): Payer: Self-pay | Admitting: Orthopaedic Surgery

## 2018-03-26 DIAGNOSIS — M1611 Unilateral primary osteoarthritis, right hip: Secondary | ICD-10-CM | POA: Diagnosis not present

## 2018-03-26 DIAGNOSIS — M47812 Spondylosis without myelopathy or radiculopathy, cervical region: Secondary | ICD-10-CM | POA: Diagnosis not present

## 2018-03-26 DIAGNOSIS — C9190 Lymphoid leukemia, unspecified not having achieved remission: Secondary | ICD-10-CM | POA: Diagnosis not present

## 2018-03-26 DIAGNOSIS — G43909 Migraine, unspecified, not intractable, without status migrainosus: Secondary | ICD-10-CM | POA: Diagnosis not present

## 2018-03-26 DIAGNOSIS — M1711 Unilateral primary osteoarthritis, right knee: Secondary | ICD-10-CM | POA: Diagnosis not present

## 2018-03-26 DIAGNOSIS — Z471 Aftercare following joint replacement surgery: Secondary | ICD-10-CM | POA: Diagnosis not present

## 2018-03-26 NOTE — Telephone Encounter (Signed)
Patient called wanting to know what she can take for pain since she finished the Rx for (Oxycodone) Patient said she is having (PT) today at 10:30am. Patient said she is icing her knee a lot. Patient asked what is she suppose to do about changing the bandages? The number to contact patient is [phone]

## 2018-03-26 NOTE — Telephone Encounter (Signed)
Please advise 

## 2018-03-26 NOTE — Telephone Encounter (Signed)
We can refill her oxycodone if she needs more.  She can also take advil and tylenol together.  Don't change the bandage until she comes back to see [place].

## 2018-03-27 ENCOUNTER — Other Ambulatory Visit (INDEPENDENT_AMBULATORY_CARE_PROVIDER_SITE_OTHER): Payer: Self-pay

## 2018-03-27 MED ORDER — OXYCODONE HCL 5 MG PO TABS: 5.0000 mg | ORAL_TABLET | ORAL | 0 refills | Status: DC | PRN

## 2018-03-27 NOTE — Telephone Encounter (Signed)
Rx ready for pick up at the front desk. I apologized told her Dr Erlinda Hong was in [place] and today he is seeing patients but Rx was signed today. Patient aware and will have someone bring her to pick up RX.

## 2018-03-27 NOTE — Telephone Encounter (Signed)
Patient called asking about her medication, let her know Dr. Erlinda Hong said she could get a refill on her oxycodone but is upset about not getting a call back letting her know. She would really like a call back from you by 1pm today so she can arrange a ride to get up here to pick it up. CB # [phone]

## 2018-03-28 DIAGNOSIS — C9190 Lymphoid leukemia, unspecified not having achieved remission: Secondary | ICD-10-CM | POA: Diagnosis not present

## 2018-03-28 DIAGNOSIS — M47812 Spondylosis without myelopathy or radiculopathy, cervical region: Secondary | ICD-10-CM | POA: Diagnosis not present

## 2018-03-28 DIAGNOSIS — G43909 Migraine, unspecified, not intractable, without status migrainosus: Secondary | ICD-10-CM | POA: Diagnosis not present

## 2018-03-28 DIAGNOSIS — Z471 Aftercare following joint replacement surgery: Secondary | ICD-10-CM | POA: Diagnosis not present

## 2018-03-28 DIAGNOSIS — M1611 Unilateral primary osteoarthritis, right hip: Secondary | ICD-10-CM | POA: Diagnosis not present

## 2018-03-28 DIAGNOSIS — M1711 Unilateral primary osteoarthritis, right knee: Secondary | ICD-10-CM | POA: Diagnosis not present

## 2018-03-30 DIAGNOSIS — M1611 Unilateral primary osteoarthritis, right hip: Secondary | ICD-10-CM | POA: Diagnosis not present

## 2018-03-30 DIAGNOSIS — G43909 Migraine, unspecified, not intractable, without status migrainosus: Secondary | ICD-10-CM | POA: Diagnosis not present

## 2018-03-30 DIAGNOSIS — C9190 Lymphoid leukemia, unspecified not having achieved remission: Secondary | ICD-10-CM | POA: Diagnosis not present

## 2018-03-30 DIAGNOSIS — M47812 Spondylosis without myelopathy or radiculopathy, cervical region: Secondary | ICD-10-CM | POA: Diagnosis not present

## 2018-03-30 DIAGNOSIS — M1711 Unilateral primary osteoarthritis, right knee: Secondary | ICD-10-CM | POA: Diagnosis not present

## 2018-03-30 DIAGNOSIS — Z471 Aftercare following joint replacement surgery: Secondary | ICD-10-CM | POA: Diagnosis not present

## 2018-04-02 DIAGNOSIS — G43909 Migraine, unspecified, not intractable, without status migrainosus: Secondary | ICD-10-CM | POA: Diagnosis not present

## 2018-04-02 DIAGNOSIS — M1611 Unilateral primary osteoarthritis, right hip: Secondary | ICD-10-CM | POA: Diagnosis not present

## 2018-04-02 DIAGNOSIS — Z471 Aftercare following joint replacement surgery: Secondary | ICD-10-CM | POA: Diagnosis not present

## 2018-04-02 DIAGNOSIS — M1711 Unilateral primary osteoarthritis, right knee: Secondary | ICD-10-CM | POA: Diagnosis not present

## 2018-04-02 DIAGNOSIS — C9190 Lymphoid leukemia, unspecified not having achieved remission: Secondary | ICD-10-CM | POA: Diagnosis not present

## 2018-04-02 DIAGNOSIS — M47812 Spondylosis without myelopathy or radiculopathy, cervical region: Secondary | ICD-10-CM | POA: Diagnosis not present

## 2018-04-03 ENCOUNTER — Ambulatory Visit (INDEPENDENT_AMBULATORY_CARE_PROVIDER_SITE_OTHER): Payer: Medicare Other | Admitting: Orthopaedic Surgery

## 2018-04-03 ENCOUNTER — Encounter (INDEPENDENT_AMBULATORY_CARE_PROVIDER_SITE_OTHER): Payer: Self-pay | Admitting: Orthopaedic Surgery

## 2018-04-03 DIAGNOSIS — Z96652 Presence of left artificial knee joint: Secondary | ICD-10-CM

## 2018-04-03 NOTE — Progress Notes (Signed)
Post-Op Visit Note   Patient: Kimberly King           Date of Birth: 1951/02/20           MRN: [id number] Visit Date: 04/03/2018 PCP: Alycia Rossetti, MD   Assessment & Plan:  Chief Complaint:  Chief Complaint  Patient presents with  . Left Knee - Pain, Follow-up   Visit Diagnoses:  1. S/P TKR (total knee replacement), left     Plan: Patient is a pleasant 67 year old female who presents to our clinic today 15 days status post left total knee replacement, date of surgery 03/19/2018.  She has been doing very well.  She has been working with home health physical therapy making great progress.  She is having some pain but has been taking ibuprofen and Tylenol for this.  No fevers, chills or any other systemic symptoms.  Examination of her left knee reveals a well-healing surgical incision without evidence of infection or cellulitis.  Calf is soft and nontender.  Range of motion 0 to 100 degrees.  At this point, we will transition from home health physical therapy to outpatient physical therapy and a prescription was given to the patient for this.  I have discussed with her the need to stop any anti-inflammatories as she is on aspirin for another 4 weeks for DVT prophylaxis.  She will take tramadol as needed.  She can then go on her diclofenac once off of aspirin.  We have discussed dental prophylaxis as well.  She will follow-up with [place] in 4 weeks time for repeat evaluation and x-ray.  Follow-Up Instructions: Return in about 1 month (around 05/01/2018).   Orders:  No orders of the defined types were placed in this encounter.  No orders of the defined types were placed in this encounter.   Imaging: No results found.  PMFS History: Patient Active Problem List   Diagnosis Date Noted  . S/P TKR (total knee replacement), left 03/19/2018  . Obesity (BMI 30-39.9) 12/31/2017  . Primary localized osteoarthritis of knees, bilateral 12/05/2017  . Primary localized osteoarthritis of right  hip 12/05/2017  . Migraines 07/01/2017  . Chronic neck and back pain 07/01/2017  . Thyroid nodule 06/30/2017  . GERD (gastroesophageal reflux disease) 06/30/2017  . Vaginal atrophy 06/30/2017  . Subclinical hyperthyroidism 12/06/2016  . CLL (chronic lymphocytic leukemia) ([place]) 12/17/2013  . COLONIC POLYPS, ADENOMATOUS 10/26/2007   Past Medical History:  Diagnosis Date  . Allergy   . Anxiety   . Arthritis    knees hips neck  . Cancer [facility])    leukemia dx 06/2013 - no current treatment - being monitored  . Cataract    bilateral - being monitored, no treatment currently  . Depression   . GERD (gastroesophageal reflux disease)   . Leukocytosis    and low blood sodium   . Migraines   . Smoker   . Thyroid nodule    JUST WATCHING    Family History  Problem Relation Age of Onset  . Colon cancer Paternal Grandmother 66  . Esophageal cancer Paternal Uncle   . Arthritis Mother   . Heart disease Father   . Lung cancer Father   . Lung cancer Maternal Grandfather   . Breast cancer Maternal Grandmother   . Cirrhosis Brother   . Rectal cancer Neg Hx   . Stomach cancer Neg Hx     Past Surgical History:  Procedure Laterality Date  . APPENDECTOMY    . BLADDER SURGERY  tack  . CERVICAL FUSION     x2  -  C5-C6  . COLONOSCOPY  2014   pyrtle - hx polyps  . DILATION AND CURETTAGE OF UTERUS     x 2  . knee     arthroscopy    left   07/2017  . TONSILLECTOMY    . TOTAL KNEE ARTHROPLASTY Left 03/19/2018   Procedure: LEFT TOTAL KNEE ARTHROPLASTY;  Surgeon: Leandrew Koyanagi, MD;  Location: [facility];  Service: Orthopedics;  Laterality: Left;   Social History   Occupational History  . Not on file  Tobacco Use  . Smoking status: Former Smoker    Packs/day: 0.25    Years: 30.00    Pack years: 7.50    Types: Cigarettes    Last attempt to quit: 04/22/2015    Years since quitting: 2.9  . Smokeless tobacco: Never Used  Substance and Sexual Activity  . Alcohol use: Yes    Comment:  Occasional  . Drug use: No  . Sexual activity: Not on file

## 2018-04-04 DIAGNOSIS — M1611 Unilateral primary osteoarthritis, right hip: Secondary | ICD-10-CM | POA: Diagnosis not present

## 2018-04-04 DIAGNOSIS — M1711 Unilateral primary osteoarthritis, right knee: Secondary | ICD-10-CM | POA: Diagnosis not present

## 2018-04-04 DIAGNOSIS — M47812 Spondylosis without myelopathy or radiculopathy, cervical region: Secondary | ICD-10-CM | POA: Diagnosis not present

## 2018-04-04 DIAGNOSIS — C9190 Lymphoid leukemia, unspecified not having achieved remission: Secondary | ICD-10-CM | POA: Diagnosis not present

## 2018-04-04 DIAGNOSIS — G43909 Migraine, unspecified, not intractable, without status migrainosus: Secondary | ICD-10-CM | POA: Diagnosis not present

## 2018-04-04 DIAGNOSIS — Z471 Aftercare following joint replacement surgery: Secondary | ICD-10-CM | POA: Diagnosis not present

## 2018-04-06 DIAGNOSIS — G43909 Migraine, unspecified, not intractable, without status migrainosus: Secondary | ICD-10-CM | POA: Diagnosis not present

## 2018-04-06 DIAGNOSIS — C9190 Lymphoid leukemia, unspecified not having achieved remission: Secondary | ICD-10-CM | POA: Diagnosis not present

## 2018-04-06 DIAGNOSIS — M47812 Spondylosis without myelopathy or radiculopathy, cervical region: Secondary | ICD-10-CM | POA: Diagnosis not present

## 2018-04-06 DIAGNOSIS — M1611 Unilateral primary osteoarthritis, right hip: Secondary | ICD-10-CM | POA: Diagnosis not present

## 2018-04-06 DIAGNOSIS — Z471 Aftercare following joint replacement surgery: Secondary | ICD-10-CM | POA: Diagnosis not present

## 2018-04-06 DIAGNOSIS — M1711 Unilateral primary osteoarthritis, right knee: Secondary | ICD-10-CM | POA: Diagnosis not present

## 2018-04-08 ENCOUNTER — Other Ambulatory Visit: Payer: Self-pay | Admitting: Family Medicine

## 2018-04-08 DIAGNOSIS — C9110 Chronic lymphocytic leukemia of B-cell type not having achieved remission: Secondary | ICD-10-CM

## 2018-04-09 NOTE — Telephone Encounter (Signed)
Ok to refill??  Last office visit 12/29/2017.  Last refill 01/24/2018.

## 2018-04-10 DIAGNOSIS — M6281 Muscle weakness (generalized): Secondary | ICD-10-CM | POA: Diagnosis not present

## 2018-04-10 DIAGNOSIS — M25562 Pain in left knee: Secondary | ICD-10-CM | POA: Diagnosis not present

## 2018-04-10 DIAGNOSIS — M25462 Effusion, left knee: Secondary | ICD-10-CM | POA: Diagnosis not present

## 2018-04-10 DIAGNOSIS — R262 Difficulty in walking, not elsewhere classified: Secondary | ICD-10-CM | POA: Diagnosis not present

## 2018-04-11 ENCOUNTER — Telehealth (INDEPENDENT_AMBULATORY_CARE_PROVIDER_SITE_OTHER): Payer: Self-pay | Admitting: Physical Medicine and Rehabilitation

## 2018-04-11 DIAGNOSIS — M47812 Spondylosis without myelopathy or radiculopathy, cervical region: Secondary | ICD-10-CM | POA: Diagnosis not present

## 2018-04-11 DIAGNOSIS — M546 Pain in thoracic spine: Secondary | ICD-10-CM | POA: Diagnosis not present

## 2018-04-11 DIAGNOSIS — M9902 Segmental and somatic dysfunction of thoracic region: Secondary | ICD-10-CM | POA: Diagnosis not present

## 2018-04-11 DIAGNOSIS — M9903 Segmental and somatic dysfunction of lumbar region: Secondary | ICD-10-CM | POA: Diagnosis not present

## 2018-04-11 DIAGNOSIS — M9901 Segmental and somatic dysfunction of cervical region: Secondary | ICD-10-CM | POA: Diagnosis not present

## 2018-04-11 DIAGNOSIS — M47816 Spondylosis without myelopathy or radiculopathy, lumbar region: Secondary | ICD-10-CM | POA: Diagnosis not present

## 2018-04-11 NOTE — Telephone Encounter (Signed)
yes

## 2018-04-11 NOTE — Telephone Encounter (Signed)
Is this okay?

## 2018-04-13 DIAGNOSIS — M6281 Muscle weakness (generalized): Secondary | ICD-10-CM | POA: Diagnosis not present

## 2018-04-13 DIAGNOSIS — R262 Difficulty in walking, not elsewhere classified: Secondary | ICD-10-CM | POA: Diagnosis not present

## 2018-04-13 DIAGNOSIS — M25562 Pain in left knee: Secondary | ICD-10-CM | POA: Diagnosis not present

## 2018-04-13 DIAGNOSIS — M25462 Effusion, left knee: Secondary | ICD-10-CM | POA: Diagnosis not present

## 2018-04-13 NOTE — Telephone Encounter (Signed)
Scheduled for 04/30/18 at 1345.

## 2018-04-16 ENCOUNTER — Telehealth: Payer: Self-pay | Admitting: Internal Medicine

## 2018-04-16 NOTE — Telephone Encounter (Signed)
Appt rescheduled per patient request from VM 7/8

## 2018-04-17 DIAGNOSIS — R262 Difficulty in walking, not elsewhere classified: Secondary | ICD-10-CM | POA: Diagnosis not present

## 2018-04-17 DIAGNOSIS — M6281 Muscle weakness (generalized): Secondary | ICD-10-CM | POA: Diagnosis not present

## 2018-04-17 DIAGNOSIS — M25462 Effusion, left knee: Secondary | ICD-10-CM | POA: Diagnosis not present

## 2018-04-17 DIAGNOSIS — M25562 Pain in left knee: Secondary | ICD-10-CM | POA: Diagnosis not present

## 2018-04-19 DIAGNOSIS — R262 Difficulty in walking, not elsewhere classified: Secondary | ICD-10-CM | POA: Diagnosis not present

## 2018-04-19 DIAGNOSIS — M25562 Pain in left knee: Secondary | ICD-10-CM | POA: Diagnosis not present

## 2018-04-19 DIAGNOSIS — M6281 Muscle weakness (generalized): Secondary | ICD-10-CM | POA: Diagnosis not present

## 2018-04-19 DIAGNOSIS — M25462 Effusion, left knee: Secondary | ICD-10-CM | POA: Diagnosis not present

## 2018-04-20 DIAGNOSIS — M25462 Effusion, left knee: Secondary | ICD-10-CM | POA: Diagnosis not present

## 2018-04-20 DIAGNOSIS — M25562 Pain in left knee: Secondary | ICD-10-CM | POA: Diagnosis not present

## 2018-04-20 DIAGNOSIS — M6281 Muscle weakness (generalized): Secondary | ICD-10-CM | POA: Diagnosis not present

## 2018-04-20 DIAGNOSIS — R262 Difficulty in walking, not elsewhere classified: Secondary | ICD-10-CM | POA: Diagnosis not present

## 2018-04-24 DIAGNOSIS — M6281 Muscle weakness (generalized): Secondary | ICD-10-CM | POA: Diagnosis not present

## 2018-04-24 DIAGNOSIS — M25562 Pain in left knee: Secondary | ICD-10-CM | POA: Diagnosis not present

## 2018-04-24 DIAGNOSIS — R262 Difficulty in walking, not elsewhere classified: Secondary | ICD-10-CM | POA: Diagnosis not present

## 2018-04-24 DIAGNOSIS — M25462 Effusion, left knee: Secondary | ICD-10-CM | POA: Diagnosis not present

## 2018-04-26 DIAGNOSIS — R262 Difficulty in walking, not elsewhere classified: Secondary | ICD-10-CM | POA: Diagnosis not present

## 2018-04-26 DIAGNOSIS — M6281 Muscle weakness (generalized): Secondary | ICD-10-CM | POA: Diagnosis not present

## 2018-04-26 DIAGNOSIS — M25562 Pain in left knee: Secondary | ICD-10-CM | POA: Diagnosis not present

## 2018-04-26 DIAGNOSIS — M25462 Effusion, left knee: Secondary | ICD-10-CM | POA: Diagnosis not present

## 2018-04-27 DIAGNOSIS — M25562 Pain in left knee: Secondary | ICD-10-CM | POA: Diagnosis not present

## 2018-04-27 DIAGNOSIS — R262 Difficulty in walking, not elsewhere classified: Secondary | ICD-10-CM | POA: Diagnosis not present

## 2018-04-27 DIAGNOSIS — M25462 Effusion, left knee: Secondary | ICD-10-CM | POA: Diagnosis not present

## 2018-04-27 DIAGNOSIS — M6281 Muscle weakness (generalized): Secondary | ICD-10-CM | POA: Diagnosis not present

## 2018-04-30 ENCOUNTER — Ambulatory Visit (INDEPENDENT_AMBULATORY_CARE_PROVIDER_SITE_OTHER): Payer: Medicare Other | Admitting: Physical Medicine and Rehabilitation

## 2018-04-30 ENCOUNTER — Encounter (INDEPENDENT_AMBULATORY_CARE_PROVIDER_SITE_OTHER): Payer: Self-pay | Admitting: Physical Medicine and Rehabilitation

## 2018-04-30 ENCOUNTER — Encounter: Payer: Self-pay | Admitting: Family Medicine

## 2018-04-30 ENCOUNTER — Ambulatory Visit (INDEPENDENT_AMBULATORY_CARE_PROVIDER_SITE_OTHER): Payer: Self-pay

## 2018-04-30 DIAGNOSIS — M6281 Muscle weakness (generalized): Secondary | ICD-10-CM | POA: Diagnosis not present

## 2018-04-30 DIAGNOSIS — M25462 Effusion, left knee: Secondary | ICD-10-CM | POA: Diagnosis not present

## 2018-04-30 DIAGNOSIS — M25551 Pain in right hip: Secondary | ICD-10-CM

## 2018-04-30 DIAGNOSIS — M25562 Pain in left knee: Secondary | ICD-10-CM | POA: Diagnosis not present

## 2018-04-30 DIAGNOSIS — R262 Difficulty in walking, not elsewhere classified: Secondary | ICD-10-CM | POA: Diagnosis not present

## 2018-04-30 NOTE — Progress Notes (Signed)
Kimberly King - 67 y.o. female MRN [id number]  Date of birth: [birth year]  Office Visit Note: Visit Date: 04/30/2018 PCP: Alycia Rossetti, MD Referred by: Alycia Rossetti, MD  Subjective: Chief Complaint  Patient presents with  . Right Hip - Pain   HPI: Kimberly King is a 67 year old female who comes in today at the request of Dr. Eduard Roux for repeat intra-articular hip injection.  Diagnostic arthrogram was performed in March with 80% relief of her right hip and groin pain.  She reports no new trauma pain is 5 out of 10 making difficulty with daily activities.   ROS Otherwise per HPI.  Assessment & Plan: Visit Diagnoses:  1. Pain in right hip     Plan: Findings:  Patient did have relief during the anesthetic phase of the injection.    Meds & Orders: No orders of the defined types were placed in this encounter.   Orders Placed This Encounter  Procedures  . Large Joint Inj: R hip joint  . XR C-ARM NO REPORT    Follow-up: Return if symptoms worsen or fail to improve.   Procedures: Large Joint Inj: R hip joint on 04/30/2018 1:04 PM Indications: diagnostic evaluation and pain Details: 22 G 3.5 in needle, fluoroscopy-guided anterior approach  Arthrogram: No  Medications: 80 mg triamcinolone acetonide 40 MG/ML; 3 mL bupivacaine 0.5 % Outcome: tolerated well, no immediate complications  There was excellent flow of contrast producing a partial arthrogram of the hip. The patient did have relief of symptoms during the anesthetic phase of the injection. Procedure, treatment alternatives, risks and benefits explained, specific risks discussed. Consent was given by the patient. Immediately prior to procedure a time out was called to verify the correct patient, procedure, equipment, support staff and site/side marked as required. Patient was prepped and draped in the usual sterile fashion.      No notes on file   Clinical History: No specialty comments available.   She  reports that she quit smoking about 3 years ago. Her smoking use included cigarettes. She has a 7.50 pack-year smoking history. She has never used smokeless tobacco. No results for input(s): HGBA1C, LABURIC in the last 8760 hours.  Objective:  VS:  HT:    WT:   BMI:     BP:   HR: bpm  TEMP: ( )  RESP:  Physical Exam  Ortho Exam Imaging: No results found.  Past Medical/Family/Surgical/Social History: Medications & Allergies reviewed per EMR, new medications updated. Patient Active Problem List   Diagnosis Date Noted  . S/P TKR (total knee replacement), left 03/19/2018  . Obesity (BMI 30-39.9) 12/31/2017  . Primary localized osteoarthritis of knees, bilateral 12/05/2017  . Primary localized osteoarthritis of right hip 12/05/2017  . Migraines 07/01/2017  . Chronic neck and back pain 07/01/2017  . Thyroid nodule 06/30/2017  . GERD (gastroesophageal reflux disease) 06/30/2017  . Vaginal atrophy 06/30/2017  . Subclinical hyperthyroidism 12/06/2016  . CLL (chronic lymphocytic leukemia) ([place]) 12/17/2013  . COLONIC POLYPS, ADENOMATOUS 10/26/2007   Past Medical History:  Diagnosis Date  . Allergy   . Anxiety   . Arthritis    knees hips neck  . Cancer [facility])    leukemia dx 06/2013 - no current treatment - being monitored  . Cataract    bilateral - being monitored, no treatment currently  . Depression   . GERD (gastroesophageal reflux disease)   . Leukocytosis    and low blood sodium   . Migraines   .  Smoker   . Thyroid nodule    JUST WATCHING   Family History  Problem Relation Age of Onset  . Colon cancer Paternal Grandmother 82  . Esophageal cancer Paternal Uncle   . Arthritis Mother   . Heart disease Father   . Lung cancer Father   . Lung cancer Maternal Grandfather   . Breast cancer Maternal Grandmother   . Cirrhosis Brother   . Rectal cancer Neg Hx   . Stomach cancer Neg Hx    Past Surgical History:  Procedure Laterality Date  . APPENDECTOMY    . BLADDER  SURGERY     tack  . CERVICAL FUSION     x2  -  C5-C6  . COLONOSCOPY  2014   pyrtle - hx polyps  . DILATION AND CURETTAGE OF UTERUS     x 2  . knee     arthroscopy    left   07/2017  . TONSILLECTOMY    . TOTAL KNEE ARTHROPLASTY Left 03/19/2018   Procedure: LEFT TOTAL KNEE ARTHROPLASTY;  Surgeon: Leandrew Koyanagi, MD;  Location: [place];  Service: Orthopedics;  Laterality: Left;   Social History   Occupational History  . Not on file  Tobacco Use  . Smoking status: Former Smoker    Packs/day: 0.25    Years: 30.00    Pack years: 7.50    Types: Cigarettes    Last attempt to quit: 04/22/2015    Years since quitting: 3.0  . Smokeless tobacco: Never Used  Substance and Sexual Activity  . Alcohol use: Yes    Comment: Occasional  . Drug use: No  . Sexual activity: Not on file

## 2018-04-30 NOTE — Progress Notes (Signed)
 .  Numeric Pain Rating Scale and Functional Assessment Average Pain 5   In the last MONTH (on 0-10 scale) has pain interfered with the following?  1. General activity like being  able to carry out your everyday physical activities such as walking, climbing stairs, carrying groceries, or moving a chair?  Rating(4)   -Dye Allergies.  

## 2018-04-30 NOTE — Patient Instructions (Signed)

## 2018-05-02 ENCOUNTER — Encounter (INDEPENDENT_AMBULATORY_CARE_PROVIDER_SITE_OTHER): Payer: Self-pay | Admitting: Orthopaedic Surgery

## 2018-05-02 ENCOUNTER — Ambulatory Visit (INDEPENDENT_AMBULATORY_CARE_PROVIDER_SITE_OTHER): Payer: Medicare Other

## 2018-05-02 ENCOUNTER — Ambulatory Visit (INDEPENDENT_AMBULATORY_CARE_PROVIDER_SITE_OTHER): Payer: Medicare Other | Admitting: Orthopaedic Surgery

## 2018-05-02 DIAGNOSIS — Z96652 Presence of left artificial knee joint: Secondary | ICD-10-CM

## 2018-05-02 DIAGNOSIS — M25462 Effusion, left knee: Secondary | ICD-10-CM | POA: Diagnosis not present

## 2018-05-02 DIAGNOSIS — R262 Difficulty in walking, not elsewhere classified: Secondary | ICD-10-CM | POA: Diagnosis not present

## 2018-05-02 DIAGNOSIS — M6281 Muscle weakness (generalized): Secondary | ICD-10-CM | POA: Diagnosis not present

## 2018-05-02 DIAGNOSIS — M25562 Pain in left knee: Secondary | ICD-10-CM | POA: Diagnosis not present

## 2018-05-02 NOTE — Progress Notes (Signed)
Post-Op Visit Note   Patient: Kimberly King           Date of Birth: [birth year]           MRN: [id number] Visit Date: 05/02/2018 PCP: Alycia Rossetti, MD   Assessment & Plan:  Chief Complaint:  Chief Complaint  Patient presents with  . Left Knee - Pain   Visit Diagnoses:  1. S/P TKR (total knee replacement), left     Plan: Patient is 6 weeks status post left total knee replacement well.  She is coming along.  Her range of motion is 0 to 120 degrees.  Her surgical scar is healed.  She takes no pain medicines.  She is continue to work with physical therapy.  She still lacks some flexion compared to her unaffected side.  From my standpoint her x-rays demonstrate stable total knee replacement.  I would like her to do 3 more weeks of physical therapy twice a week and then 3 more weeks after that at once a week.  Recheck in 6 weeks.  Work note was provided today.  Follow-Up Instructions: Return in about 6 weeks (around 06/13/2018).   Orders:  Orders Placed This Encounter  Procedures  . XR KNEE 3 VIEW LEFT   No orders of the defined types were placed in this encounter.   Imaging: Xr Knee 3 View Left  Result Date: 05/02/2018 Stable left total knee replacement.   PMFS History: Patient Active Problem List   Diagnosis Date Noted  . S/P TKR (total knee replacement), left 03/19/2018  . Obesity (BMI 30-39.9) 12/31/2017  . Primary localized osteoarthritis of knees, bilateral 12/05/2017  . Primary localized osteoarthritis of right hip 12/05/2017  . Migraines 07/01/2017  . Chronic neck and back pain 07/01/2017  . Thyroid nodule 06/30/2017  . GERD (gastroesophageal reflux disease) 06/30/2017  . Vaginal atrophy 06/30/2017  . Subclinical hyperthyroidism 12/06/2016  . CLL (chronic lymphocytic leukemia) ([place]) 12/17/2013  . COLONIC POLYPS, ADENOMATOUS 10/26/2007   Past Medical History:  Diagnosis Date  . Allergy   . Anxiety   . Arthritis    knees hips neck  . Cancer [facility])    leukemia dx 06/2013 - no current treatment - being monitored  . Cataract    bilateral - being monitored, no treatment currently  . Depression   . GERD (gastroesophageal reflux disease)   . Leukocytosis    and low blood sodium   . Migraines   . Smoker   . Thyroid nodule    JUST WATCHING    Family History  Problem Relation Age of Onset  . Colon cancer Paternal Grandmother 30  . Esophageal cancer Paternal Uncle   . Arthritis Mother   . Heart disease Father   . Lung cancer Father   . Lung cancer Maternal Grandfather   . Breast cancer Maternal Grandmother   . Cirrhosis Brother   . Rectal cancer Neg Hx   . Stomach cancer Neg Hx     Past Surgical History:  Procedure Laterality Date  . APPENDECTOMY    . BLADDER SURGERY     tack  . CERVICAL FUSION     x2  -  C5-C6  . COLONOSCOPY  2014   pyrtle - hx polyps  . DILATION AND CURETTAGE OF UTERUS     x 2  . knee     arthroscopy    left   07/2017  . TONSILLECTOMY    . TOTAL KNEE ARTHROPLASTY Left 03/19/2018   Procedure:  LEFT TOTAL KNEE ARTHROPLASTY;  Surgeon: Leandrew Koyanagi, MD;  Location: [place];  Service: Orthopedics;  Laterality: Left;   Social History   Occupational History  . Not on file  Tobacco Use  . Smoking status: Former Smoker    Packs/day: 0.25    Years: 30.00    Pack years: 7.50    Types: Cigarettes    Last attempt to quit: 04/22/2015    Years since quitting: 3.0  . Smokeless tobacco: Never Used  Substance and Sexual Activity  . Alcohol use: Yes    Comment: Occasional  . Drug use: No  . Sexual activity: Not on file

## 2018-05-07 ENCOUNTER — Encounter: Payer: Self-pay | Admitting: Family Medicine

## 2018-05-07 ENCOUNTER — Ambulatory Visit (INDEPENDENT_AMBULATORY_CARE_PROVIDER_SITE_OTHER): Payer: Medicare Other | Admitting: Family Medicine

## 2018-05-07 VITALS — BP 160/94 | HR 78 | Temp 98.1°F | Resp 16 | Ht 64.0 in | Wt 173.0 lb

## 2018-05-07 DIAGNOSIS — M25562 Pain in left knee: Secondary | ICD-10-CM | POA: Diagnosis not present

## 2018-05-07 DIAGNOSIS — K117 Disturbances of salivary secretion: Secondary | ICD-10-CM

## 2018-05-07 DIAGNOSIS — M6281 Muscle weakness (generalized): Secondary | ICD-10-CM | POA: Diagnosis not present

## 2018-05-07 DIAGNOSIS — M25462 Effusion, left knee: Secondary | ICD-10-CM | POA: Diagnosis not present

## 2018-05-07 DIAGNOSIS — B029 Zoster without complications: Secondary | ICD-10-CM

## 2018-05-07 DIAGNOSIS — R682 Dry mouth, unspecified: Secondary | ICD-10-CM | POA: Diagnosis not present

## 2018-05-07 DIAGNOSIS — R262 Difficulty in walking, not elsewhere classified: Secondary | ICD-10-CM | POA: Diagnosis not present

## 2018-05-07 DIAGNOSIS — I10 Essential (primary) hypertension: Secondary | ICD-10-CM | POA: Diagnosis not present

## 2018-05-07 MED ORDER — OXYCODONE HCL 5 MG PO TABS: 5.0000 mg | ORAL_TABLET | ORAL | 0 refills | Status: DC | PRN

## 2018-05-07 MED ORDER — VALACYCLOVIR HCL 1 G PO TABS: 1000.0000 mg | ORAL_TABLET | Freq: Three times a day (TID) | ORAL | 0 refills | Status: DC

## 2018-05-07 NOTE — Progress Notes (Signed)
Subjective:    Patient ID: Kimberly King, female    DOB: 18-Jun-1951, 67 y.o.   MRN: [id number]  HPI  Patient is a very pleasant 67 year old Caucasian female with a past medical history of CLL.  She presents today for 2 concerns.  #1 she reports dry mouth.  Dry mouth has been occurring almost on a daily basis ever since her knee surgery.  She does take Benadryl occasionally throughout the week for allergies.  She also takes Zantac every night.  However she is done this for quite some time with no problems.  None of her other medications have anticholinergic side effects.  Recently in the hospital, her labs were significant for possible prediabetes.  She denies any polyuria, polydipsia, or blurry vision.  She denies any signs or symptoms of dehydration.  In fact her blood pressure today is quite elevated however she attributes this to pain.  This brings me to my second issue.  She reports a rash.  It started approximately a week ago.  It started in the center of her back roughly the level of T9.  It is an erythematous clump of numerous small 2 to 3 mm vesicles.  The entire patch is roughly the diameter of a silver dollar.  She also has a rash underneath her right breast.  When I reviewed this rash, there were three 4 mm vesicles in a linear group on an erythematous base.  This leads me to believe that the patient is suffering from shingles. Past Medical History:  Diagnosis Date  . Allergy   . Anxiety   . Arthritis    knees hips neck  . Cancer [facility])    leukemia dx 06/2013 - no current treatment - being monitored  . Cataract    bilateral - being monitored, no treatment currently  . Depression   . GERD (gastroesophageal reflux disease)   . Leukocytosis    and low blood sodium   . Migraines   . Smoker   . Thyroid nodule    JUST WATCHING   Past Surgical History:  Procedure Laterality Date  . APPENDECTOMY    . BLADDER SURGERY     tack  . CERVICAL FUSION     x2  -  C5-C6  . COLONOSCOPY   2014   pyrtle - hx polyps  . DILATION AND CURETTAGE OF UTERUS     x 2  . knee     arthroscopy    left   07/2017  . TONSILLECTOMY    . TOTAL KNEE ARTHROPLASTY Left 03/19/2018   Procedure: LEFT TOTAL KNEE ARTHROPLASTY;  Surgeon: Leandrew Koyanagi, MD;  Location: [place];  Service: Orthopedics;  Laterality: Left;   Current Outpatient Medications on File Prior to Visit  Medication Sig Dispense Refill  . clobetasol ointment (TEMOVATE) 8.65 % Apply 1 application topically 2 (two) times a week. 30 g 2  . conjugated estrogens (PREMARIN) vaginal cream Place 1 Applicatorful vaginally daily. 42.5 g 12  . cyanocobalamin 2000 MCG tablet Take 2,000 mcg by mouth daily.    . DENTA 5000 PLUS 1.1 % CREA dental cream Place 1 application onto teeth at bedtime. brush teeth for 2 minutes and spit out do not rinse  2  . diclofenac (VOLTAREN) 75 MG EC tablet Take 75 mg by mouth daily.  3  . diphenhydrAMINE-PE-APAP 25-5-325 MG TABS Take 1 tablet by mouth daily as needed (for allergies).    . fluticasone (FLONASE) 50 MCG/ACT nasal spray Place 1 spray  into both nostrils daily as needed for allergies or rhinitis.    Marland Kitchen glucosamine-chondroitin 500-400 MG tablet Take 1 tablet by mouth daily.     Marland Kitchen lidocaine (LMX) 4 % cream Apply 1 application topically 2 (two) times daily as needed (for pain).     Marland Kitchen Lifitegrast (XIIDRA) 5 % SOLN Place 1 drop into both eyes 2 (two) times a week.     . LUTEIN-ZEAXANTHIN PO Take 1 tablet by mouth daily.    . pantoprazole (PROTONIX) 40 MG tablet Take 1 tablet (40 mg total) by mouth daily. 90 tablet 3  . Polyethyl Glycol-Propyl Glycol (SYSTANE OP) Place 1 drop into both eyes 2 (two) times daily.    . ranitidine (ZANTAC) 150 MG tablet Take 1 tablet (150 mg total) by mouth at bedtime. 90 tablet 2  . SUMAtriptan (IMITREX) 50 MG tablet Take 50 mg by mouth every 2 (two) hours as needed for migraine.     . traMADol (ULTRAM) 50 MG tablet Take 1 tablet (50 mg total) by mouth 2 (two) times daily as needed  for moderate pain. 120 tablet 0   No current facility-administered medications on file prior to visit.    No Known Allergies Social History   Socioeconomic History  . Marital status: Divorced    Spouse name: Not on file  . Number of children: Not on file  . Years of education: Not on file  . Highest education level: Not on file  Occupational History  . Not on file  Social Needs  . Financial resource strain: Not on file  . Food insecurity:    Worry: Not on file    Inability: Not on file  . Transportation needs:    Medical: Not on file    Non-medical: Not on file  Tobacco Use  . Smoking status: Former Smoker    Packs/day: 0.25    Years: 30.00    Pack years: 7.50    Types: Cigarettes    Last attempt to quit: 04/22/2015    Years since quitting: 3.0  . Smokeless tobacco: Never Used  Substance and Sexual Activity  . Alcohol use: Yes    Comment: Occasional  . Drug use: No  . Sexual activity: Not on file  Lifestyle  . Physical activity:    Days per week: Not on file    Minutes per session: Not on file  . Stress: Not on file  Relationships  . Social connections:    Talks on phone: Not on file    Gets together: Not on file    Attends religious service: Not on file    Active member of club or organization: Not on file    Attends meetings of clubs or organizations: Not on file    Relationship status: Not on file  . Intimate partner violence:    Fear of current or ex partner: Not on file    Emotionally abused: Not on file    Physically abused: Not on file    Forced sexual activity: Not on file  Other Topics Concern  . Not on file  Social History Narrative  . Not on file     Review of Systems  All other systems reviewed and are negative.      Objective:   Physical Exam  Constitutional: She appears well-developed and well-nourished.  HENT:  Head: Normocephalic.  Right Ear: External ear normal.  Left Ear: External ear normal.  Mouth/Throat: Oropharynx is clear  and moist. No oropharyngeal exudate.  Neck:  Neck supple.  Cardiovascular: Normal rate and regular rhythm.  Pulmonary/Chest: Effort normal and breath sounds normal.  Lymphadenopathy:    She has no cervical adenopathy.  Skin: Rash noted.     Vitals reviewed.         Assessment & Plan:  Xerostomia - Plan: CBC with Differential/Platelet, COMPLETE METABOLIC PANEL WITH GFR, ANA  Herpes zoster without complication - Plan: valACYclovir (VALTREX) 1000 MG tablet, oxyCODONE (ROXICODONE) 5 MG immediate release tablet  Patient does have a past medical history of dry.  This coupled with dry mouth raises concern over possible Sjogren's disease.  Therefore I will check an ANA.  If the ANA is positive, we could check anti-Ro and anti-La antibodies.  I will also check a CBC to evaluate for severe anemia as well as a CMP to evaluate for signs of dehydration or type 2 diabetes mellitus.  Regarding her shingles, I will treat the patient with Valtrex 1 g p.o. 3 times daily for 7 days.  I will give her oxycodone.  She can have 5 mg every 8 hours as needed for breakthrough pain.  She will use this medicine sparingly.  She is on tramadol twice a day for knee pain.  If the tramadol is not controlling the pain from shingles, she can supplement with oxycodone.

## 2018-05-09 ENCOUNTER — Telehealth: Payer: Self-pay | Admitting: Family Medicine

## 2018-05-09 DIAGNOSIS — M546 Pain in thoracic spine: Secondary | ICD-10-CM | POA: Diagnosis not present

## 2018-05-09 DIAGNOSIS — M47816 Spondylosis without myelopathy or radiculopathy, lumbar region: Secondary | ICD-10-CM | POA: Diagnosis not present

## 2018-05-09 DIAGNOSIS — M47812 Spondylosis without myelopathy or radiculopathy, cervical region: Secondary | ICD-10-CM | POA: Diagnosis not present

## 2018-05-09 DIAGNOSIS — M9902 Segmental and somatic dysfunction of thoracic region: Secondary | ICD-10-CM | POA: Diagnosis not present

## 2018-05-09 DIAGNOSIS — M9903 Segmental and somatic dysfunction of lumbar region: Secondary | ICD-10-CM | POA: Diagnosis not present

## 2018-05-09 DIAGNOSIS — M9901 Segmental and somatic dysfunction of cervical region: Secondary | ICD-10-CM | POA: Diagnosis not present

## 2018-05-09 LAB — CBC WITH DIFFERENTIAL/PLATELET
Basophils Absolute: 189 cells/uL (ref 0–200)
Basophils Relative: 0.8 %
EOS PCT: 1.2 %
Eosinophils Absolute: 283 cells/uL (ref 15–500)
HCT: 35.7 % (ref 35.0–45.0)
HEMOGLOBIN: 11.5 g/dL — AB (ref 11.7–15.5)
LYMPHS ABS: 11753 {cells}/uL — AB (ref 850–3900)
MCH: 27.6 pg (ref 27.0–33.0)
MCHC: 32.2 g/dL (ref 32.0–36.0)
MCV: 85.6 fL (ref 80.0–100.0)
MPV: 9.9 fL (ref 7.5–12.5)
Monocytes Relative: 6 %
NEUTROS PCT: 42.2 %
Neutro Abs: 9959 cells/uL — ABNORMAL HIGH (ref 1500–7800)
Platelets: 537 10*3/uL — ABNORMAL HIGH (ref 140–400)
RBC: 4.17 10*6/uL (ref 3.80–5.10)
RDW: 13.6 % (ref 11.0–15.0)
TOTAL LYMPHOCYTE: 49.8 %
WBC mixed population: 1416 cells/uL — ABNORMAL HIGH (ref 200–950)
WBC: 23.6 10*3/uL — AB (ref 3.8–10.8)

## 2018-05-09 LAB — COMPLETE METABOLIC PANEL WITH GFR
AG RATIO: 2 (calc) (ref 1.0–2.5)
ALBUMIN MSPROF: 4.5 g/dL (ref 3.6–5.1)
ALT: 12 U/L (ref 6–29)
AST: 13 U/L (ref 10–35)
Alkaline phosphatase (APISO): 104 U/L (ref 33–130)
BILIRUBIN TOTAL: 0.3 mg/dL (ref 0.2–1.2)
BUN / CREAT RATIO: 30 (calc) — AB (ref 6–22)
BUN: 27 mg/dL — ABNORMAL HIGH (ref 7–25)
CO2: 27 mmol/L (ref 20–32)
Calcium: 9.8 mg/dL (ref 8.6–10.4)
Chloride: 101 mmol/L (ref 98–110)
Creat: 0.91 mg/dL (ref 0.50–0.99)
GFR, EST AFRICAN AMERICAN: 76 mL/min/{1.73_m2} (ref >=60)
GFR, Est Non African American: 66 mL/min/{1.73_m2} (ref >=60)
Globulin: 2.3 g/dL (calc) (ref 1.9–3.7)
Glucose, Bld: 112 mg/dL — ABNORMAL HIGH (ref 65–99)
POTASSIUM: 4.5 mmol/L (ref 3.5–5.3)
SODIUM: 136 mmol/L (ref 135–146)
TOTAL PROTEIN: 6.8 g/dL (ref 6.1–8.1)

## 2018-05-09 LAB — ANA: ANA: NEGATIVE

## 2018-05-09 NOTE — Telephone Encounter (Signed)
Pt called and states that she is breaking out with lesions on other parts of her body. Nothing bad she just wanted to let you know about as she does not think it is shingles. She even has one in top of her head.

## 2018-05-10 MED ORDER — TRIAMCINOLONE ACETONIDE 40 MG/ML IJ SUSP
80.0000 mg | INTRAMUSCULAR | Status: AC | PRN
  Administered 2018-04-30: 80 mg via INTRA_ARTICULAR

## 2018-05-10 MED ORDER — BUPIVACAINE HCL 0.5 % IJ SOLN
3.0000 mL | INTRAMUSCULAR | Status: AC | PRN
  Administered 2018-04-30: 3 mL via INTRA_ARTICULAR

## 2018-05-10 NOTE — Telephone Encounter (Signed)
LMTRC

## 2018-05-10 NOTE — Telephone Encounter (Signed)
She needs to be seen.  She is right, that is not shingles if other lesions are present.

## 2018-05-11 DIAGNOSIS — M25562 Pain in left knee: Secondary | ICD-10-CM | POA: Diagnosis not present

## 2018-05-11 DIAGNOSIS — M6281 Muscle weakness (generalized): Secondary | ICD-10-CM | POA: Diagnosis not present

## 2018-05-11 DIAGNOSIS — M25462 Effusion, left knee: Secondary | ICD-10-CM | POA: Diagnosis not present

## 2018-05-11 DIAGNOSIS — R262 Difficulty in walking, not elsewhere classified: Secondary | ICD-10-CM | POA: Diagnosis not present

## 2018-05-14 ENCOUNTER — Encounter: Payer: Self-pay | Admitting: Family Medicine

## 2018-05-14 ENCOUNTER — Ambulatory Visit (INDEPENDENT_AMBULATORY_CARE_PROVIDER_SITE_OTHER): Payer: Medicare Other | Admitting: Family Medicine

## 2018-05-14 VITALS — BP 128/60 | HR 76 | Temp 98.1°F | Resp 16 | Ht 64.0 in | Wt 173.0 lb

## 2018-05-14 DIAGNOSIS — M25462 Effusion, left knee: Secondary | ICD-10-CM | POA: Diagnosis not present

## 2018-05-14 DIAGNOSIS — M6281 Muscle weakness (generalized): Secondary | ICD-10-CM | POA: Diagnosis not present

## 2018-05-14 DIAGNOSIS — K117 Disturbances of salivary secretion: Secondary | ICD-10-CM

## 2018-05-14 DIAGNOSIS — R262 Difficulty in walking, not elsewhere classified: Secondary | ICD-10-CM | POA: Diagnosis not present

## 2018-05-14 DIAGNOSIS — R682 Dry mouth, unspecified: Secondary | ICD-10-CM | POA: Diagnosis not present

## 2018-05-14 DIAGNOSIS — B029 Zoster without complications: Secondary | ICD-10-CM

## 2018-05-14 DIAGNOSIS — M25562 Pain in left knee: Secondary | ICD-10-CM | POA: Diagnosis not present

## 2018-05-14 NOTE — Telephone Encounter (Signed)
Has apt today 

## 2018-05-14 NOTE — Progress Notes (Signed)
Subjective:    Patient ID: Kimberly King, female    DOB: [birth year], 67 y.o.   MRN: [id number]  HPI 05/07/18 Patient is a very pleasant 67 year old Caucasian female with a past medical history of CLL.  She presents today for 2 concerns.  #1 she reports dry mouth.  Dry mouth has been occurring almost on a daily basis ever since her knee surgery.  She does take Benadryl occasionally throughout the week for allergies.  She also takes Zantac every night.  However she is done this for quite some time with no problems.  None of her other medications have anticholinergic side effects.  Recently in the hospital, her labs were significant for possible prediabetes.  She denies any polyuria, polydipsia, or blurry vision.  She denies any signs or symptoms of dehydration.  In fact her blood pressure today is quite elevated however she attributes this to pain.  This brings me to my second issue.  She reports a rash.  It started approximately a week ago.  It started in the center of her back roughly the level of T9.  It is an erythematous clump of numerous small 2 to 3 mm vesicles.  The entire patch is roughly the diameter of a silver dollar.  She also has a rash underneath her right breast.  When I reviewed this rash, there were three 4 mm vesicles in a linear group on an erythematous base.  This leads me to believe that the patient is suffering from shingles.  At that time, my plan was: Patient does have a past medical history of dry.  This coupled with dry mouth raises concern over possible Sjogren's disease.  Therefore I will check an ANA.  If the ANA is positive, we could check anti-Ro and anti-La antibodies.  I will also check a CBC to evaluate for severe anemia as well as a CMP to evaluate for signs of dehydration or type 2 diabetes mellitus.  Regarding her shingles, I will treat the patient with Valtrex 1 g p.o. 3 times daily for 7 days.  I will give her oxycodone.  She can have 5 mg every 8 hours as needed for  breakthrough pain.  She will use this medicine sparingly.  She is on tramadol twice a day for knee pain.  If the tramadol is not controlling the pain from shingles, she can supplement with oxycodone.  05/14/18 Patient called back after her last visit stating that she was breaking out in multiple other places which I said was inconsistent with shingles therefore I requested that she come back by so that we can reexamine her as I was questioning the diagnosis.  On exam today, she has what appears to be classic shingles-like presentation on the right side with a erythematous patch of skin with clustered scabbing blisters in the center of her back as well as under her right breast.  This is classic for shingles.  When I asked the patient to show me the other lesions that she was referring to on the phone, there is one small 3 mm erythematous papule on her upper left chest and one scab that is approximately 2 to 3 mm on the crown of her head.  There is no other rash seen.  Therefore I believe that the patient definitely had shingles.  The other "breaking out" that the patient described could be completely unrelated but does not suggest a different diagnosis Past Medical History:  Diagnosis Date  . Allergy   .  Anxiety   . Arthritis    knees hips neck  . Cancer [facility])    leukemia dx 06/2013 - no current treatment - being monitored  . Cataract    bilateral - being monitored, no treatment currently  . Depression   . GERD (gastroesophageal reflux disease)   . Leukocytosis    and low blood sodium   . Migraines   . Smoker   . Thyroid nodule    JUST WATCHING   Past Surgical History:  Procedure Laterality Date  . APPENDECTOMY    . BLADDER SURGERY     tack  . CERVICAL FUSION     x2  -  C5-C6  . COLONOSCOPY  2014   pyrtle - hx polyps  . DILATION AND CURETTAGE OF UTERUS     x 2  . knee     arthroscopy    left   07/2017  . TONSILLECTOMY    . TOTAL KNEE ARTHROPLASTY Left 03/19/2018   Procedure: LEFT  TOTAL KNEE ARTHROPLASTY;  Surgeon: Leandrew Koyanagi, MD;  Location: [place];  Service: Orthopedics;  Laterality: Left;   Current Outpatient Medications on File Prior to Visit  Medication Sig Dispense Refill  . clobetasol ointment (TEMOVATE) 6.29 % Apply 1 application topically 2 (two) times a week. 30 g 2  . conjugated estrogens (PREMARIN) vaginal cream Place 1 Applicatorful vaginally daily. 42.5 g 12  . cyanocobalamin 2000 MCG tablet Take 2,000 mcg by mouth daily.    . DENTA 5000 PLUS 1.1 % CREA dental cream Place 1 application onto teeth at bedtime. brush teeth for 2 minutes and spit out do not rinse  2  . diclofenac (VOLTAREN) 75 MG EC tablet Take 75 mg by mouth daily.  3  . diphenhydrAMINE-PE-APAP 25-5-325 MG TABS Take 1 tablet by mouth daily as needed (for allergies).    . fluticasone (FLONASE) 50 MCG/ACT nasal spray Place 1 spray into both nostrils daily as needed for allergies or rhinitis.    Marland Kitchen glucosamine-chondroitin 500-400 MG tablet Take 1 tablet by mouth daily.     Marland Kitchen lidocaine (LMX) 4 % cream Apply 1 application topically 2 (two) times daily as needed (for pain).     Marland Kitchen Lifitegrast (XIIDRA) 5 % SOLN Place 1 drop into both eyes 2 (two) times a week.     . LUTEIN-ZEAXANTHIN PO Take 1 tablet by mouth daily.    Marland Kitchen oxyCODONE (ROXICODONE) 5 MG immediate release tablet Take 1 tablet (5 mg total) by mouth every 4 (four) hours as needed for severe pain. 30 tablet 0  . pantoprazole (PROTONIX) 40 MG tablet Take 1 tablet (40 mg total) by mouth daily. 90 tablet 3  . Polyethyl Glycol-Propyl Glycol (SYSTANE OP) Place 1 drop into both eyes 2 (two) times daily.    . ranitidine (ZANTAC) 150 MG tablet Take 1 tablet (150 mg total) by mouth at bedtime. 90 tablet 2  . SUMAtriptan (IMITREX) 50 MG tablet Take 50 mg by mouth every 2 (two) hours as needed for migraine.     . traMADol (ULTRAM) 50 MG tablet Take 1 tablet (50 mg total) by mouth 2 (two) times daily as needed for moderate pain. 120 tablet 0  .  valACYclovir (VALTREX) 1000 MG tablet Take 1 tablet (1,000 mg total) by mouth 3 (three) times daily. 21 tablet 0   No current facility-administered medications on file prior to visit.    No Known Allergies Social History   Socioeconomic History  . Marital status: Divorced  Spouse name: Not on file  . Number of children: Not on file  . Years of education: Not on file  . Highest education level: Not on file  Occupational History  . Not on file  Social Needs  . Financial resource strain: Not on file  . Food insecurity:    Worry: Not on file    Inability: Not on file  . Transportation needs:    Medical: Not on file    Non-medical: Not on file  Tobacco Use  . Smoking status: Former Smoker    Packs/day: 0.25    Years: 30.00    Pack years: 7.50    Types: Cigarettes    Last attempt to quit: 04/22/2015    Years since quitting: 3.0  . Smokeless tobacco: Never Used  Substance and Sexual Activity  . Alcohol use: Yes    Comment: Occasional  . Drug use: No  . Sexual activity: Not on file  Lifestyle  . Physical activity:    Days per week: Not on file    Minutes per session: Not on file  . Stress: Not on file  Relationships  . Social connections:    Talks on phone: Not on file    Gets together: Not on file    Attends religious service: Not on file    Active member of club or organization: Not on file    Attends meetings of clubs or organizations: Not on file    Relationship status: Not on file  . Intimate partner violence:    Fear of current or ex partner: Not on file    Emotionally abused: Not on file    Physically abused: Not on file    Forced sexual activity: Not on file  Other Topics Concern  . Not on file  Social History Narrative  . Not on file     Review of Systems  All other systems reviewed and are negative.      Objective:   Physical Exam  Constitutional: She appears well-developed and well-nourished.  HENT:  Head: Normocephalic.  Right Ear: External  ear normal.  Left Ear: External ear normal.  Mouth/Throat: Oropharynx is clear and moist. No oropharyngeal exudate.  Neck: Neck supple.  Cardiovascular: Normal rate and regular rhythm.  Pulmonary/Chest: Effort normal and breath sounds normal.  Lymphadenopathy:    She has no cervical adenopathy.  Skin: Rash noted.     Vitals reviewed.         Assessment & Plan:  Rash looks more consistent with shingles today than previous.  I reassured the patient that I do believe this is shingles in the mid thoracic dermatome.  The other 2 specific lesions appear to be benign bumps of nonspecific origin.  They appear to be healing as well.  No further treatment is necessary for the rash.  She is completed Valtrex.  She can use pain pills as necessary for postherpetic neuralgia and if worsening we could try gabapentin versus Lyrica.  We did discuss pilocarpine for dry mouth and after weighing the risk and the benefits, the patient elects not to use pilocarpine.

## 2018-05-16 ENCOUNTER — Other Ambulatory Visit (INDEPENDENT_AMBULATORY_CARE_PROVIDER_SITE_OTHER): Payer: Self-pay | Admitting: Orthopaedic Surgery

## 2018-05-16 DIAGNOSIS — M25562 Pain in left knee: Secondary | ICD-10-CM | POA: Diagnosis not present

## 2018-05-16 DIAGNOSIS — M6281 Muscle weakness (generalized): Secondary | ICD-10-CM | POA: Diagnosis not present

## 2018-05-16 DIAGNOSIS — R262 Difficulty in walking, not elsewhere classified: Secondary | ICD-10-CM | POA: Diagnosis not present

## 2018-05-16 DIAGNOSIS — M25462 Effusion, left knee: Secondary | ICD-10-CM | POA: Diagnosis not present

## 2018-05-16 NOTE — Telephone Encounter (Signed)
Does not need to continue taking this

## 2018-05-21 DIAGNOSIS — M6281 Muscle weakness (generalized): Secondary | ICD-10-CM | POA: Diagnosis not present

## 2018-05-21 DIAGNOSIS — R262 Difficulty in walking, not elsewhere classified: Secondary | ICD-10-CM | POA: Diagnosis not present

## 2018-05-21 DIAGNOSIS — M25562 Pain in left knee: Secondary | ICD-10-CM | POA: Diagnosis not present

## 2018-05-21 DIAGNOSIS — M25462 Effusion, left knee: Secondary | ICD-10-CM | POA: Diagnosis not present

## 2018-05-23 ENCOUNTER — Encounter: Payer: Self-pay | Admitting: *Deleted

## 2018-05-23 DIAGNOSIS — M25562 Pain in left knee: Secondary | ICD-10-CM | POA: Diagnosis not present

## 2018-05-23 DIAGNOSIS — R262 Difficulty in walking, not elsewhere classified: Secondary | ICD-10-CM | POA: Diagnosis not present

## 2018-05-23 DIAGNOSIS — M25462 Effusion, left knee: Secondary | ICD-10-CM | POA: Diagnosis not present

## 2018-05-23 DIAGNOSIS — M6281 Muscle weakness (generalized): Secondary | ICD-10-CM | POA: Diagnosis not present

## 2018-05-23 DIAGNOSIS — E785 Hyperlipidemia, unspecified: Secondary | ICD-10-CM | POA: Insufficient documentation

## 2018-05-28 DIAGNOSIS — M25462 Effusion, left knee: Secondary | ICD-10-CM | POA: Diagnosis not present

## 2018-05-28 DIAGNOSIS — M25562 Pain in left knee: Secondary | ICD-10-CM | POA: Diagnosis not present

## 2018-05-28 DIAGNOSIS — R262 Difficulty in walking, not elsewhere classified: Secondary | ICD-10-CM | POA: Diagnosis not present

## 2018-05-28 DIAGNOSIS — M6281 Muscle weakness (generalized): Secondary | ICD-10-CM | POA: Diagnosis not present

## 2018-06-04 DIAGNOSIS — M6281 Muscle weakness (generalized): Secondary | ICD-10-CM | POA: Diagnosis not present

## 2018-06-04 DIAGNOSIS — R262 Difficulty in walking, not elsewhere classified: Secondary | ICD-10-CM | POA: Diagnosis not present

## 2018-06-04 DIAGNOSIS — M25562 Pain in left knee: Secondary | ICD-10-CM | POA: Diagnosis not present

## 2018-06-04 DIAGNOSIS — M25462 Effusion, left knee: Secondary | ICD-10-CM | POA: Diagnosis not present

## 2018-06-06 DIAGNOSIS — M546 Pain in thoracic spine: Secondary | ICD-10-CM | POA: Diagnosis not present

## 2018-06-06 DIAGNOSIS — M47812 Spondylosis without myelopathy or radiculopathy, cervical region: Secondary | ICD-10-CM | POA: Diagnosis not present

## 2018-06-06 DIAGNOSIS — M9901 Segmental and somatic dysfunction of cervical region: Secondary | ICD-10-CM | POA: Diagnosis not present

## 2018-06-06 DIAGNOSIS — M47816 Spondylosis without myelopathy or radiculopathy, lumbar region: Secondary | ICD-10-CM | POA: Diagnosis not present

## 2018-06-06 DIAGNOSIS — M9903 Segmental and somatic dysfunction of lumbar region: Secondary | ICD-10-CM | POA: Diagnosis not present

## 2018-06-06 DIAGNOSIS — M9902 Segmental and somatic dysfunction of thoracic region: Secondary | ICD-10-CM | POA: Diagnosis not present

## 2018-06-07 ENCOUNTER — Ambulatory Visit (INDEPENDENT_AMBULATORY_CARE_PROVIDER_SITE_OTHER): Payer: Medicare Other | Admitting: Physician Assistant

## 2018-06-07 ENCOUNTER — Encounter: Payer: Self-pay | Admitting: Physician Assistant

## 2018-06-07 VITALS — BP 128/90 | HR 89 | Temp 98.1°F | Resp 16 | Ht 64.0 in | Wt 177.4 lb

## 2018-06-07 DIAGNOSIS — L239 Allergic contact dermatitis, unspecified cause: Secondary | ICD-10-CM | POA: Diagnosis not present

## 2018-06-07 MED ORDER — PREDNISONE 20 MG PO TABS: ORAL_TABLET | ORAL | 0 refills | Status: DC

## 2018-06-07 NOTE — Progress Notes (Signed)
Patient ID: Kimberly King MRN: [id number], DOB: 08/15/1951, 67 y.o. Date of Encounter: 06/07/2018, 2:57 PM    Chief Complaint:  Chief Complaint  Patient presents with  . Rash    on arms      HPI: 67 y.o. year old female presetns with above.   Prior to her visit, I reviewed Dr. Samella Parr note from 08/07/2018 and 05/14/2018. At those visits she had shingles.  However, today she states that this is a completely new, different rash. She states that this rash is itchy. States that this rash just started this past Sunday morning. Areas of this rash are on her neck and bilateral lower arms. No other areas of rash. She has been taking oral Benadryl and has applied some over-the-counter cortisone cream with minimal relief.  States that she took her grandchildren to the park Sunday morning.  However says that the really was not even much grass where they were and does not think she came in contact with anything there to cause an allergic dermatitis.  Can not think of anything that she is come in contact with in general.  Has not used no new lotions or soaps etc.  No other concerns to address.     Home Meds:   Outpatient Medications Prior to Visit  Medication Sig Dispense Refill  . clobetasol ointment (TEMOVATE) 9.03 % Apply 1 application topically 2 (two) times a week. 30 g 2  . conjugated estrogens (PREMARIN) vaginal cream Place 1 Applicatorful vaginally daily. 42.5 g 12  . DENTA 5000 PLUS 1.1 % CREA dental cream Place 1 application onto teeth at bedtime. brush teeth for 2 minutes and spit out do not rinse  2  . diclofenac (VOLTAREN) 75 MG EC tablet Take 75 mg by mouth daily.  3  . diphenhydrAMINE-PE-APAP 25-5-325 MG TABS Take 1 tablet by mouth daily as needed (for allergies).    . fluticasone (FLONASE) 50 MCG/ACT nasal spray Place 1 spray into both nostrils daily as needed for allergies or rhinitis.    Marland Kitchen glucosamine-chondroitin 500-400 MG tablet Take 1 tablet by mouth daily.       Marland Kitchen Lifitegrast (XIIDRA) 5 % SOLN Place 1 drop into both eyes 2 (two) times a week.     . LUTEIN-ZEAXANTHIN PO Take 1 tablet by mouth daily.    . pantoprazole (PROTONIX) 40 MG tablet Take 1 tablet (40 mg total) by mouth daily. 90 tablet 3  . Polyethyl Glycol-Propyl Glycol (SYSTANE OP) Place 1 drop into both eyes 2 (two) times daily.    . ranitidine (ZANTAC) 150 MG tablet Take 1 tablet (150 mg total) by mouth at bedtime. 90 tablet 2  . SUMAtriptan (IMITREX) 50 MG tablet Take 50 mg by mouth every 2 (two) hours as needed for migraine.     . traMADol (ULTRAM) 50 MG tablet Take 1 tablet (50 mg total) by mouth 2 (two) times daily as needed for moderate pain. 120 tablet 0  . cyanocobalamin 2000 MCG tablet Take 2,000 mcg by mouth daily.    Marland Kitchen lidocaine (LMX) 4 % cream Apply 1 application topically 2 (two) times daily as needed (for pain).     Marland Kitchen oxyCODONE (ROXICODONE) 5 MG immediate release tablet Take 1 tablet (5 mg total) by mouth every 4 (four) hours as needed for severe pain. 30 tablet 0  . valACYclovir (VALTREX) 1000 MG tablet Take 1 tablet (1,000 mg total) by mouth 3 (three) times daily. 21 tablet 0   No facility-administered medications prior  to visit.     Allergies: No Known Allergies    Review of Systems: See HPI for pertinent ROS. All other ROS negative.    Physical Exam: Blood pressure 128/90, pulse 89, temperature 98.1 F (36.7 C), temperature source Oral, resp. rate 16, height 5\' 4"  (1.626 m), weight 80.5 kg, SpO2 98 %., Body mass index is 30.45 kg/m. General: WNWD WF.  Appears in no acute distress. Neck: Supple. No thyromegaly. No lymphadenopathy. Lungs: Clear bilaterally to auscultation without wheezes, rales, or rhonchi. Breathing is unlabored. Heart: Regular rhythm. No murmurs, rubs, or gallops. Msk:  Strength and tone normal for age. Skin: Mild urticarial type rash across both sides of posterior neck and around both sides of neck and to her upper chest.  Mild  urticarial type  rash on tops of both forearms extending up about 2 inches above elbow area. (Is wearing a short sleeve shirt and the urticarial rash stops at the level of the short sleeve.)   Mild urticarial type rash = light pink color and barely raised. Remainder of skin is normal with no rash. Neuro: Alert and oriented X 3. Moves all extremities spontaneously. Gait is normal. CNII-XII grossly in tact. Psych:  Responds to questions appropriately with a normal affect.     ASSESSMENT AND PLAN:  67 y.o. year old female with  1. Allergic dermatitis She is to take the prednisone taper as directed.  Follow-up if rash does not resolve upon completion of this. - predniSONE (DELTASONE) 20 MG tablet; Take 3 daily for 2 days, then 2 daily for 2 days, then 1 daily for 2 days.  Dispense: 12 tablet; Refill: 0   Signed, [address] [place], [place], [facility] 06/07/2018 2:57 PM

## 2018-06-08 ENCOUNTER — Telehealth (INDEPENDENT_AMBULATORY_CARE_PROVIDER_SITE_OTHER): Payer: Self-pay | Admitting: Orthopaedic Surgery

## 2018-06-08 MED ORDER — AMOXICILLIN 500 MG PO TABS: ORAL_TABLET | ORAL | 0 refills | Status: DC

## 2018-06-08 NOTE — Telephone Encounter (Signed)
Patient called requesting an RX for an antibiotic be called into the [facility] on [address].  Patient has a dentist appointment on Sept 4th.  CB#[phone]

## 2018-06-08 NOTE — Telephone Encounter (Signed)
Amoxicillin 500mg  #4  Take 4 tablets 30 min prior to procedure per Dr. Erlinda Hong protocol.  Med sent to pharmacy. Patient advised.

## 2018-06-09 ENCOUNTER — Other Ambulatory Visit: Payer: Self-pay | Admitting: Family Medicine

## 2018-06-13 ENCOUNTER — Ambulatory Visit (INDEPENDENT_AMBULATORY_CARE_PROVIDER_SITE_OTHER): Payer: Medicare Other | Admitting: Orthopaedic Surgery

## 2018-06-13 ENCOUNTER — Encounter (INDEPENDENT_AMBULATORY_CARE_PROVIDER_SITE_OTHER): Payer: Self-pay | Admitting: Orthopaedic Surgery

## 2018-06-13 DIAGNOSIS — Z96652 Presence of left artificial knee joint: Secondary | ICD-10-CM

## 2018-06-13 NOTE — Progress Notes (Signed)
Post-Op Visit Note   Patient: Kimberly King           Date of Birth: 1951/02/14           MRN: [id number] Visit Date: 06/13/2018 PCP: Alycia Rossetti, MD   Assessment & Plan:  Chief Complaint:  Chief Complaint  Patient presents with  . Left Knee - Routine Post Op, Follow-up   Visit Diagnoses:  1. S/P TKR (total knee replacement), left     Plan: Patient is 3 months status post left total knee replacement.  Overall she is doing very well and reports no pain.  She has completed physical therapy.  Her range of motion is 0 to 125 degrees.  Her surgical scar is fully healed.  She is very happy with her recovery.  She is very happy with her progress.  From my standpoint I will see her back in 3 months with 2 view x-rays of the left knee.  Dental antibiotic prophylaxis was reinforced.  Follow-Up Instructions: Return in about 3 months (around 09/12/2018).   Orders:  No orders of the defined types were placed in this encounter.  No orders of the defined types were placed in this encounter.   Imaging: No results found.  PMFS History: Patient Active Problem List   Diagnosis Date Noted  . Mild hyperlipidemia 05/23/2018  . S/P TKR (total knee replacement), left 03/19/2018  . Obesity (BMI 30-39.9) 12/31/2017  . Primary localized osteoarthritis of knees, bilateral 12/05/2017  . Primary localized osteoarthritis of right hip 12/05/2017  . Migraines 07/01/2017  . Chronic neck and back pain 07/01/2017  . Thyroid nodule 06/30/2017  . GERD (gastroesophageal reflux disease) 06/30/2017  . Vaginal atrophy 06/30/2017  . Subclinical hyperthyroidism 12/06/2016  . CLL (chronic lymphocytic leukemia) ([place]) 12/17/2013  . COLONIC POLYPS, ADENOMATOUS 10/26/2007   Past Medical History:  Diagnosis Date  . Allergy   . Anxiety   . Arthritis    knees hips neck  . Cancer [facility])    leukemia dx 06/2013 - no current treatment - being monitored  . Cataract    bilateral - being monitored, no  treatment currently  . Depression   . GERD (gastroesophageal reflux disease)   . Leukocytosis    and low blood sodium   . Migraines   . Smoker   . Thyroid nodule    JUST WATCHING    Family History  Problem Relation Age of Onset  . Colon cancer Paternal Grandmother 29  . Esophageal cancer Paternal Uncle   . Arthritis Mother   . Heart disease Father   . Lung cancer Father   . Lung cancer Maternal Grandfather   . Breast cancer Maternal Grandmother   . Cirrhosis Brother   . Rectal cancer Neg Hx   . Stomach cancer Neg Hx     Past Surgical History:  Procedure Laterality Date  . APPENDECTOMY    . BLADDER SURGERY     tack  . CERVICAL FUSION     x2  -  C5-C6  . COLONOSCOPY  2014   pyrtle - hx polyps  . DILATION AND CURETTAGE OF UTERUS     x 2  . knee     arthroscopy    left   07/2017  . TONSILLECTOMY    . TOTAL KNEE ARTHROPLASTY Left 03/19/2018   Procedure: LEFT TOTAL KNEE ARTHROPLASTY;  Surgeon: Leandrew Koyanagi, MD;  Location: [place];  Service: Orthopedics;  Laterality: Left;   Social History   Occupational  History  . Not on file  Tobacco Use  . Smoking status: Former Smoker    Packs/day: 0.25    Years: 30.00    Pack years: 7.50    Types: Cigarettes    Last attempt to quit: 04/22/2015    Years since quitting: 3.1  . Smokeless tobacco: Never Used  Substance and Sexual Activity  . Alcohol use: Yes    Comment: Occasional  . Drug use: No  . Sexual activity: Not on file

## 2018-06-19 ENCOUNTER — Other Ambulatory Visit: Payer: Self-pay | Admitting: Family Medicine

## 2018-06-19 DIAGNOSIS — C9110 Chronic lymphocytic leukemia of B-cell type not having achieved remission: Secondary | ICD-10-CM

## 2018-06-19 NOTE — Telephone Encounter (Signed)
I need to check database for all controlled substances first.  I see she has CLL, but need to review chart to verify meds, and see if oncology is managing pain at all too before I Rx narcotics

## 2018-06-19 NOTE — Telephone Encounter (Signed)
Ok to refill??  Last office visit 06/07/2018.  Last refill 04/09/2018.

## 2018-06-20 NOTE — Telephone Encounter (Signed)
Please pull controlled substance database so I can verify and refill

## 2018-06-20 NOTE — Telephone Encounter (Signed)
Printed and routed to provider.

## 2018-06-20 NOTE — Telephone Encounter (Signed)
I do not have access to the registry. Please ask front office to assist with this.

## 2018-06-25 ENCOUNTER — Inpatient Hospital Stay: Payer: Medicare Other | Attending: Internal Medicine

## 2018-06-25 ENCOUNTER — Telehealth: Payer: Self-pay

## 2018-06-25 ENCOUNTER — Encounter: Payer: Self-pay | Admitting: Internal Medicine

## 2018-06-25 ENCOUNTER — Inpatient Hospital Stay (HOSPITAL_BASED_OUTPATIENT_CLINIC_OR_DEPARTMENT_OTHER): Payer: Medicare Other | Admitting: Internal Medicine

## 2018-06-25 VITALS — BP 144/89 | HR 81 | Temp 98.2°F | Resp 17 | Ht 64.0 in | Wt 182.0 lb

## 2018-06-25 DIAGNOSIS — C9110 Chronic lymphocytic leukemia of B-cell type not having achieved remission: Secondary | ICD-10-CM | POA: Insufficient documentation

## 2018-06-25 DIAGNOSIS — I10 Essential (primary) hypertension: Secondary | ICD-10-CM | POA: Insufficient documentation

## 2018-06-25 LAB — CBC WITH DIFFERENTIAL (CANCER CENTER ONLY)
Basophils Absolute: 0.1 10*3/uL (ref 0.0–0.1)
Basophils Relative: 0 %
EOS PCT: 2 %
Eosinophils Absolute: 0.3 10*3/uL (ref 0.0–0.5)
HCT: 35 % (ref 34.8–46.6)
Hemoglobin: 10.9 g/dL — ABNORMAL LOW (ref 11.6–15.9)
LYMPHS ABS: 10.2 10*3/uL — AB (ref 0.9–3.3)
LYMPHS PCT: 56 %
MCH: 27.5 pg (ref 25.1–34.0)
MCHC: 31.1 g/dL — AB (ref 31.5–36.0)
MCV: 88.4 fL (ref 79.5–101.0)
MONO ABS: 1 10*3/uL — AB (ref 0.1–0.9)
Monocytes Relative: 6 %
Neutro Abs: 6.4 10*3/uL (ref 1.5–6.5)
Neutrophils Relative %: 36 %
PLATELETS: 309 10*3/uL (ref 145–400)
RBC: 3.96 MIL/uL (ref 3.70–5.45)
RDW: 16 % — ABNORMAL HIGH (ref 11.2–14.5)
WBC: 18 10*3/uL — AB (ref 3.9–10.3)

## 2018-06-25 LAB — LACTATE DEHYDROGENASE: LDH: 164 U/L (ref 98–192)

## 2018-06-25 NOTE — Addendum Note (Signed)
Addended by: Ardeen Garland on: 06/25/2018 02:56 PM   Modules accepted: Orders

## 2018-06-25 NOTE — Progress Notes (Signed)
[place] [phone]   [phone]  OFFICE PROGRESS NOTE  Alycia Rossetti, MD [address] [place] [address]  DIAGNOSIS: Stage 0 chronic lymphocytic leukemia  PRIOR THERAPY: None  CURRENT THERAPY: Observation.  INTERVAL HISTORY: Kimberly King 67 y.o. female returns to the clinic today for six-month follow-up visit.  The patient is feeling fine today with no concerning complaints.  She denied having any chest pain, shortness of breath, cough or hemoptysis.  She denied having any recent weight loss or night sweats.  She has no palpable lymphadenopathy.  She has no nausea, vomiting, diarrhea or constipation.  She underwent left knee replacement few months ago and she is feeling much better.  She is here today for evaluation and repeat blood work.  MEDICAL HISTORY: Past Medical History:  Diagnosis Date  . Allergy   . Anxiety   . Arthritis    knees hips neck  . Cancer [facility])    leukemia dx 06/2013 - no current treatment - being monitored  . Cataract    bilateral - being monitored, no treatment currently  . Depression   . GERD (gastroesophageal reflux disease)   . Leukocytosis    and low blood sodium   . Migraines   . Smoker   . Thyroid nodule    JUST WATCHING    ALLERGIES:  has No Known Allergies.  MEDICATIONS:  Current Outpatient Medications  Medication Sig Dispense Refill  . amoxicillin (AMOXIL) 500 MG tablet Take 4 tablets by mouth 30 minutes prior to dental procedure. 4 tablet 0  . clobetasol ointment (TEMOVATE) 5.95 % Apply 1 application topically 2 (two) times a week. 30 g 2  . conjugated estrogens (PREMARIN) vaginal cream Place 1 Applicatorful vaginally daily. 42.5 g 12  . cyanocobalamin 2000 MCG tablet Take 2,000 mcg by mouth daily.    . DENTA 5000 PLUS 1.1 % CREA dental cream Place 1 application onto teeth at bedtime. brush teeth for 2 minutes and spit out do not rinse  2  . diclofenac (VOLTAREN) 75 MG EC  tablet Take 75 mg by mouth daily.  3  . diphenhydrAMINE-PE-APAP 25-5-325 MG TABS Take 1 tablet by mouth daily as needed (for allergies).    . fluticasone (FLONASE) 50 MCG/ACT nasal spray Place 1 spray into both nostrils daily as needed for allergies or rhinitis.    Marland Kitchen glucosamine-chondroitin 500-400 MG tablet Take 1 tablet by mouth daily.     Marland Kitchen Lifitegrast (XIIDRA) 5 % SOLN Place 1 drop into both eyes 2 (two) times a week.     . LUTEIN-ZEAXANTHIN PO Take 1 tablet by mouth daily.    . pantoprazole (PROTONIX) 40 MG tablet TAKE 1 TABLET BY MOUTH EVERY DAY 90 tablet 3  . Polyethyl Glycol-Propyl Glycol (SYSTANE OP) Place 1 drop into both eyes 2 (two) times daily.    . predniSONE (DELTASONE) 20 MG tablet Take 3 daily for 2 days, then 2 daily for 2 days, then 1 daily for 2 days. 12 tablet 0  . ranitidine (ZANTAC) 150 MG tablet Take 1 tablet (150 mg total) by mouth at bedtime. 90 tablet 2  . SUMAtriptan (IMITREX) 50 MG tablet Take 50 mg by mouth every 2 (two) hours as needed for migraine.     . traMADol (ULTRAM) 50 MG tablet TAKE 1 TABLET (50 MG TOTAL) BY MOUTH 2 (TWO) TIMES DAILY AS NEEDED FOR MODERATE PAIN. 120 tablet 0   No current facility-administered  medications for this visit.     SURGICAL HISTORY:  Past Surgical History:  Procedure Laterality Date  . APPENDECTOMY    . BLADDER SURGERY     tack  . CERVICAL FUSION     x2  -  C5-C6  . COLONOSCOPY  2014   pyrtle - hx polyps  . DILATION AND CURETTAGE OF UTERUS     x 2  . knee     arthroscopy    left   07/2017  . TONSILLECTOMY    . TOTAL KNEE ARTHROPLASTY Left 03/19/2018   Procedure: LEFT TOTAL KNEE ARTHROPLASTY;  Surgeon: Leandrew Koyanagi, MD;  Location: [place];  Service: Orthopedics;  Laterality: Left;    REVIEW OF SYSTEMS:  A comprehensive review of systems was negative.   PHYSICAL EXAMINATION: General appearance: alert, cooperative and no distress Head: Normocephalic, without obvious abnormality, atraumatic Neck: no adenopathy, no JVD,  supple, symmetrical, trachea midline and thyroid not enlarged, symmetric, no tenderness/mass/nodules Lymph nodes: Cervical, supraclavicular, and axillary nodes normal. Resp: clear to auscultation bilaterally Back: symmetric, no curvature. ROM normal. No CVA tenderness. Cardio: regular rate and rhythm, S1, S2 normal, no murmur, click, rub or gallop GI: soft, non-tender; bowel sounds normal; no masses,  no organomegaly Extremities: extremities normal, atraumatic, no cyanosis or edema  ECOG PERFORMANCE STATUS: 0 - Asymptomatic  Blood pressure (!) 144/89, pulse 81, temperature 98.2 F (36.8 C), temperature source Oral, resp. rate 17, height 5\' 4"  (1.626 m), weight 182 lb (82.6 kg), SpO2 97 %.  LABORATORY DATA: Lab Results  Component Value Date   WBC 18.0 (H) 06/25/2018   HGB 10.9 (L) 06/25/2018   HCT 35.0 06/25/2018   MCV 88.4 06/25/2018   PLT 309 06/25/2018      Chemistry      Component Value Date/Time   NA 136 05/07/2018 1255   NA 139 06/27/2017 1324   K 4.5 05/07/2018 1255   K 3.5 06/27/2017 1324   CL 101 05/07/2018 1255   CO2 27 05/07/2018 1255   CO2 26 06/27/2017 1324   BUN 27 (H) 05/07/2018 1255   BUN 22.4 06/27/2017 1324   CREATININE 0.91 05/07/2018 1255   CREATININE 0.8 06/27/2017 1324      Component Value Date/Time   CALCIUM 9.8 05/07/2018 1255   CALCIUM 9.7 06/27/2017 1324   ALKPHOS 94 03/13/2018 0951   ALKPHOS 87 06/27/2017 1324   AST 13 05/07/2018 1255   AST 21 06/27/2017 1324   ALT 12 05/07/2018 1255   ALT 32 06/27/2017 1324   BILITOT 0.3 05/07/2018 1255   BILITOT 0.32 06/27/2017 1324       RADIOGRAPHIC STUDIES: No results found.  ASSESSMENT AND PLAN:  This is a very pleasant 67 years old white female with a stage 0 chronic lymphocytic leukemia and currently on observation. The patient is feeling fine today with no concerning complaints. CBC today showed elevated but stable total white blood count of 18,000. I discussed the lab results with the  patient and recommended for her to continue on observation with repeat CBC and LDH in 6 months. For hypertension, I strongly advised the patient to monitor her blood pressure closely at home and to report to her primary care physician for adjustment of her medication if needed. She was advised to call immediately if she has any concerning symptoms in the interval. The patient voices understanding of current disease status and treatment options and is in agreement with the current care plan. All questions were answered. The patient  knows to call the clinic with any problems, questions or concerns. We can certainly see the patient much sooner if necessary. I spent 10 minutes counseling the patient face to face. The total time spent in the appointment was 15 minutes.  Disclaimer: This note was dictated with voice recognition software. Similar sounding words can inadvertently be transcribed and may not be corrected upon review.

## 2018-06-25 NOTE — Telephone Encounter (Signed)
Printed avs and calender of upcomng appointment. Per 9/16 los

## 2018-06-26 ENCOUNTER — Ambulatory Visit: Payer: Medicare Other | Admitting: Internal Medicine

## 2018-06-26 ENCOUNTER — Other Ambulatory Visit: Payer: Medicare Other

## 2018-06-28 ENCOUNTER — Other Ambulatory Visit: Payer: Medicare Other

## 2018-07-02 ENCOUNTER — Encounter: Payer: Medicare Other | Admitting: Family Medicine

## 2018-07-02 ENCOUNTER — Other Ambulatory Visit: Payer: Medicare Other

## 2018-07-02 ENCOUNTER — Encounter: Payer: Medicare Other | Admitting: Physician Assistant

## 2018-07-02 DIAGNOSIS — E785 Hyperlipidemia, unspecified: Secondary | ICD-10-CM

## 2018-07-03 LAB — LIPID PANEL
CHOL/HDL RATIO: 3.6 (calc) (ref <5.0)
Cholesterol: 208 mg/dL — ABNORMAL HIGH (ref <200)
HDL: 58 mg/dL (ref >50)
LDL Cholesterol (Calc): 134 mg/dL (calc) — ABNORMAL HIGH
NON-HDL CHOLESTEROL (CALC): 150 mg/dL — AB (ref <130)
TRIGLYCERIDES: 68 mg/dL (ref <150)

## 2018-07-04 ENCOUNTER — Ambulatory Visit (INDEPENDENT_AMBULATORY_CARE_PROVIDER_SITE_OTHER): Payer: Medicare Other | Admitting: Physician Assistant

## 2018-07-04 ENCOUNTER — Encounter: Payer: Self-pay | Admitting: Physician Assistant

## 2018-07-04 VITALS — BP 124/78 | HR 83 | Temp 98.3°F | Resp 16 | Ht 64.0 in | Wt 181.0 lb

## 2018-07-04 DIAGNOSIS — Z23 Encounter for immunization: Secondary | ICD-10-CM

## 2018-07-04 DIAGNOSIS — M9901 Segmental and somatic dysfunction of cervical region: Secondary | ICD-10-CM | POA: Diagnosis not present

## 2018-07-04 DIAGNOSIS — M47812 Spondylosis without myelopathy or radiculopathy, cervical region: Secondary | ICD-10-CM | POA: Diagnosis not present

## 2018-07-04 DIAGNOSIS — M9902 Segmental and somatic dysfunction of thoracic region: Secondary | ICD-10-CM | POA: Diagnosis not present

## 2018-07-04 DIAGNOSIS — M546 Pain in thoracic spine: Secondary | ICD-10-CM | POA: Diagnosis not present

## 2018-07-04 DIAGNOSIS — M9903 Segmental and somatic dysfunction of lumbar region: Secondary | ICD-10-CM | POA: Diagnosis not present

## 2018-07-04 DIAGNOSIS — Z0000 Encounter for general adult medical examination without abnormal findings: Secondary | ICD-10-CM | POA: Diagnosis not present

## 2018-07-04 DIAGNOSIS — M47816 Spondylosis without myelopathy or radiculopathy, lumbar region: Secondary | ICD-10-CM | POA: Diagnosis not present

## 2018-07-04 NOTE — Progress Notes (Signed)
Patient ID: Kimberly King MRN: [id number], DOB: 1951-05-19, 67 y.o. Date of Encounter: 07/04/2018,   Chief Complaint: Physical (CPE)  HPI: 67 y.o. y/o female  here for CPE.   She has no specific concerns to address today.  Is here to update preventive care.  Review of Systems: Consitutional: No fever, chills, fatigue, night sweats, lymphadenopathy. No significant/unexplained weight changes. Eyes: No visual changes, eye redness, or discharge. ENT/Mouth: No ear pain, sore throat, nasal drainage, or sinus pain. Cardiovascular: No chest pressure,heaviness, tightness or squeezing, even with exertion. No increased shortness of breath or dyspnea on exertion.No palpitations, edema, orthopnea, PND. Respiratory: No cough, hemoptysis, SOB, or wheezing. Gastrointestinal: No anorexia, dysphagia, reflux, pain, nausea, vomiting, hematemesis, diarrhea, constipation, BRBPR, or melena. Breast: No mass, nodules, bulging, or retraction. No skin changes or inflammation. No nipple discharge. No lymphadenopathy. Genitourinary: No dysuria, hematuria, incontinence, vaginal discharge, pruritis, burning, abnormal bleeding, or pain. Musculoskeletal: No decreased ROM, No joint pain or swelling. No significant pain in neck, back, or extremities. Skin: No rash, pruritis, or concerning lesions. Neurological: No headache, dizziness, syncope, seizures, tremors, memory loss, coordination problems, or paresthesias. Psychological: No anxiety, depression, hallucinations, SI/HI. Endocrine: No polydipsia, polyphagia, polyuria, or known diabetes.No increased fatigue. No palpitations/rapid heart rate. No significant/unexplained weight change. All other systems were reviewed and are otherwise negative.  Past Medical History:  Diagnosis Date  . Allergy   . Anxiety   . Arthritis    knees hips neck  . Cancer [facility])    leukemia dx 06/2013 - no current treatment - being monitored  . Cataract    bilateral - being monitored, no  treatment currently  . Depression   . GERD (gastroesophageal reflux disease)   . Leukocytosis    and low blood sodium   . Migraines   . Smoker   . Thyroid nodule    JUST WATCHING     Past Surgical History:  Procedure Laterality Date  . APPENDECTOMY    . BLADDER SURGERY     tack  . CERVICAL FUSION     x2  -  C5-C6  . COLONOSCOPY  2014   pyrtle - hx polyps  . DILATION AND CURETTAGE OF UTERUS     x 2  . knee     arthroscopy    left   07/2017  . TONSILLECTOMY    . TOTAL KNEE ARTHROPLASTY Left 03/19/2018   Procedure: LEFT TOTAL KNEE ARTHROPLASTY;  Surgeon: Leandrew Koyanagi, MD;  Location: [place];  Service: Orthopedics;  Laterality: Left;    Home Meds:  Outpatient Medications Prior to Visit  Medication Sig Dispense Refill  . amoxicillin (AMOXIL) 500 MG tablet Take 4 tablets by mouth 30 minutes prior to dental procedure. 4 tablet 0  . clobetasol ointment (TEMOVATE) 7.06 % Apply 1 application topically 2 (two) times a week. 30 g 2  . conjugated estrogens (PREMARIN) vaginal cream Place 1 Applicatorful vaginally daily. 42.5 g 12  . cyanocobalamin 2000 MCG tablet Take 2,000 mcg by mouth daily.    . DENTA 5000 PLUS 1.1 % CREA dental cream Place 1 application onto teeth at bedtime. brush teeth for 2 minutes and spit out do not rinse  2  . diclofenac (VOLTAREN) 75 MG EC tablet Take 75 mg by mouth daily.  3  . diphenhydrAMINE-PE-APAP 25-5-325 MG TABS Take 1 tablet by mouth daily as needed (for allergies).    . fluticasone (FLONASE) 50 MCG/ACT nasal spray Place 1 spray into both nostrils daily as  needed for allergies or rhinitis.    Marland Kitchen glucosamine-chondroitin 500-400 MG tablet Take 1 tablet by mouth daily.     Marland Kitchen Lifitegrast (XIIDRA) 5 % SOLN Place 1 drop into both eyes 2 (two) times a week.     . LUTEIN-ZEAXANTHIN PO Take 1 tablet by mouth daily.    . pantoprazole (PROTONIX) 40 MG tablet TAKE 1 TABLET BY MOUTH EVERY DAY 90 tablet 3  . Polyethyl Glycol-Propyl Glycol (SYSTANE OP) Place 1 drop  into both eyes 2 (two) times daily.    . ranitidine (ZANTAC) 150 MG tablet Take 1 tablet (150 mg total) by mouth at bedtime. 90 tablet 2  . SUMAtriptan (IMITREX) 50 MG tablet Take 50 mg by mouth every 2 (two) hours as needed for migraine.     . traMADol (ULTRAM) 50 MG tablet TAKE 1 TABLET (50 MG TOTAL) BY MOUTH 2 (TWO) TIMES DAILY AS NEEDED FOR MODERATE PAIN. 120 tablet 0   No facility-administered medications prior to visit.     Allergies: No Known Allergies  Social History   Socioeconomic History  . Marital status: Divorced    Spouse name: Not on file  . Number of children: Not on file  . Years of education: Not on file  . Highest education level: Not on file  Occupational History  . Not on file  Social Needs  . Financial resource strain: Not on file  . Food insecurity:    Worry: Not on file    Inability: Not on file  . Transportation needs:    Medical: Not on file    Non-medical: Not on file  Tobacco Use  . Smoking status: Former Smoker    Packs/day: 0.25    Years: 30.00    Pack years: 7.50    Types: Cigarettes    Last attempt to quit: 04/22/2015    Years since quitting: 3.2  . Smokeless tobacco: Never Used  Substance and Sexual Activity  . Alcohol use: Yes    Comment: Occasional  . Drug use: No  . Sexual activity: Not on file  Lifestyle  . Physical activity:    Days per week: Not on file    Minutes per session: Not on file  . Stress: Not on file  Relationships  . Social connections:    Talks on phone: Not on file    Gets together: Not on file    Attends religious service: Not on file    Active member of club or organization: Not on file    Attends meetings of clubs or organizations: Not on file    Relationship status: Not on file  . Intimate partner violence:    Fear of current or ex partner: Not on file    Emotionally abused: Not on file    Physically abused: Not on file    Forced sexual activity: Not on file  Other Topics Concern  . Not on file    Social History Narrative  . Not on file    Family History  Problem Relation Age of Onset  . Colon cancer Paternal Grandmother 24  . Esophageal cancer Paternal Uncle   . Arthritis Mother   . Heart disease Father   . Lung cancer Father   . Lung cancer Maternal Grandfather   . Breast cancer Maternal Grandmother   . Cirrhosis Brother   . Rectal cancer Neg Hx   . Stomach cancer Neg Hx    She saw gynecologist for GYN visit March 2019.  Therefore GYN exam  not repeated today.  Physical Exam: Blood pressure 124/78, pulse 83, temperature 98.3 F (36.8 C), temperature source Oral, resp. rate 16, height 5\' 4"  (1.626 m), weight 181 lb (82.1 kg), SpO2 96 %., Body mass index is 31.07 kg/m. General: Well developed, well nourished WF. Appears in no acute distress. HEENT: Normocephalic, atraumatic. Conjunctiva pink, sclera non-icteric. Pupils 2 mm constricting to 1 mm, round, regular, and equally reactive to light and accomodation. EOMI. Internal auditory canal clear. TMs with good cone of light and without pathology. Nasal mucosa pink. Nares are without discharge. No sinus tenderness. Oral mucosa pink. Neck: Supple. Trachea midline. No thyromegaly. Full ROM. No lymphadenopathy.No Carotid Bruits. Lungs: Clear to auscultation bilaterally without wheezes, rales, or rhonchi. Breathing is of normal effort and unlabored. Cardiovascular: RRR with S1 S2. No murmurs, rubs, or gallops. Distal pulses 2+ symmetrically. No carotid or abdominal bruits. Abdomen: Soft, non-tender, non-distended with normoactive bowel sounds. No hepatosplenomegaly or masses. No rebound/guarding. No CVA tenderness. No hernias.  Musculoskeletal: Full range of motion and 5/5 strength throughout.  Skin: Warm and moist without erythema, ecchymosis, wounds, or rash. Neuro: A+Ox3. CN II-XII grossly intact. Moves all extremities spontaneously. Full sensation throughout. Normal gait.  Psych:  Responds to questions appropriately with a normal  affect.   Assessment/Plan:  67 y.o. y/o female here for CPE  1. Encounter for preventive health examination  A. Screening Labs: She recently came and had lipid panel drawn here.  That was stable.  Continue low-cholesterol diet to keep this controlled. She has had other labs checked by her different specialist so other labs were not checked now.  She has seen endocrinology for her thyroid.  She sees hematology for her CBC.  B. Pap: She is now > 28 y/o so no further Pap indicated.  She saw gynecologist within the past year and had a GYN exam there is a GYN exam not repeated today.  C. Screening Mammogram: Last mammogram 09/11/2017.  D. DEXA/BMD:  Bone density scan was 10/22/2015.  Normal.  T score was -0.6, -0.4.  Since Bone Density was so good,  can wait to repeat DEXA scan.  E. Colorectal Cancer Screening: She had colonoscopy 05/20/2013 then repeat 05/12/2016.  States that after that when she was told she could wait 5 years.  Due 05/12/2021  F. Immunizations:  Influenza: Does want to get flu vaccine today. Tetanus: To date.  Received Td 12/31/2007 Pneumococcal: She has received Pneumovax 23 and Prevnar 13.   2. Thyroid:  States that her prior PCP had referred her to an endocrinologist about her thyroid but then when that specialist looked at the report they said that everything looked okay and that there was no need for follow-up there.  Ever she states that thyroid ultrasound did show a nodule and that at that ultrasound report said to repeat ultrasound one year. I did review that she had a thyroid ultrasound on 07/12/2017.  I have reviewed the Dr. Buelah Manis has already placed an order for follow-up.  I have told patient this will be due after October 3.  She voices understanding and agrees to follow-up for ultrasound after October 3.  3.  Sees multiple specialists: [facility]----has OA of bilateral knees.  Has chronic right hip pain. Dr. Sherwood Gambler---- had cervical fusion in the  past.  Still followed by neurosurgery, has lumbar stenosis.  Takes diclofenac daily and Ultram. Hematology/Oncology------[facility] regarding thyroid as above GI ------Dr. Hilarie Fredrickson for colonoscopy.  Patient has had previous polyps and she has family  history She has seen GYN for Gyn Exam. March 2018 Ophthalmologist Dr. [facility]--history of basal cell carcinoma, had Mohs surgery.  Has divot on nose.  Had melanoma 2000 on calf.-  Subjective:   Patient presents for Medicare Annual/Subsequent preventive examination.   Review Past Medical/Family/Social: This is reviewed today   Risk Factors  Current exercise habits: She states that she has recently been able to return to doing some water aerobics. Dietary issues discussed: Tries to be careful with diet in regards to low-cholesterol diet.  Cardiac risk factors: Obesity, Age  Depression Screen  (Note: if answer to either of the following is "Yes", a more complete depression screening is indicated)  Over the past two weeks, have you felt down, depressed or hopeless? No Over the past two weeks, have you felt little interest or pleasure in doing things? No Have you lost interest or pleasure in daily life? No Do you often feel hopeless? No Do you cry easily over simple problems? No   Activities of Daily Living  In your present state of health, do you have any difficulty performing the following activities?:  Driving? No  Managing money? No  Feeding yourself? No  Getting from bed to chair? No  Climbing a flight of stairs? No  Preparing food and eating?: No  Bathing or showering? No  Getting dressed: No  Getting to the toilet? No  Using the toilet:No  Moving around from place to place: No  In the past year have you fallen or had a near fall?:No  Are you sexually active? No  Do you have more than one partner? No   Hearing Difficulties: No  Do you often ask people to speak up or repeat themselves? No  Do you experience  ringing or noises in your ears? No Do you have difficulty understanding soft or whispered voices? No  Do you feel that you have a problem with memory? No Do you often misplace items? No  Do you feel safe at home? Yes  Cognitive Testing  Alert? Yes Normal Appearance?Yes  Oriented to person? Yes Place? Yes  Time? Yes  Recall of three objects? Yes  Can perform simple calculations? Yes  Displays appropriate judgment?Yes  Can read the correct time from a watch face?Yes   List the Names of Other Physician/Practitioners you currently use:  All listed in the note above.  Indicate any recent Medical Services you may have received from other than [facility] providers in the past year (date may be approximate).   Screening Tests / Date--- All of this information as documented above. Colonoscopy                     Zostavax  Mammogram  Influenza Vaccine  Tetanus/tdap    Assessment:    Annual wellness medicare exam   Plan:    During the course of the visit the patient was educated and counseled about appropriate screening and preventive services including:  Screening mammography  Colorectal cancer screening  Medicare Attestation  I have personally reviewed:  The patient's medical and social history  Their use of alcohol, tobacco or illicit drugs  Their current medications and supplements  The patient's functional ability including ADLs,fall risks, home safety risks, cognitive, and hearing and visual impairment  Diet and physical activities  Evidence for depression or mood disorders  The patient's weight, height, BMI, and visual acuity have been recorded in the chart. I have made referrals, counseling, and provided education to the patient based  on review of the above and I have provided the patient with a written personalized care plan for preventive services.       [address] [place], [place], [facility] 07/04/2018 8:39 AM

## 2018-07-20 DIAGNOSIS — M542 Cervicalgia: Secondary | ICD-10-CM | POA: Diagnosis not present

## 2018-07-20 DIAGNOSIS — M5030 Other cervical disc degeneration, unspecified cervical region: Secondary | ICD-10-CM | POA: Diagnosis not present

## 2018-07-20 DIAGNOSIS — M4722 Other spondylosis with radiculopathy, cervical region: Secondary | ICD-10-CM | POA: Diagnosis not present

## 2018-07-23 ENCOUNTER — Telehealth: Payer: Self-pay

## 2018-07-23 DIAGNOSIS — Z23 Encounter for immunization: Secondary | ICD-10-CM | POA: Diagnosis not present

## 2018-07-23 DIAGNOSIS — R202 Paresthesia of skin: Secondary | ICD-10-CM | POA: Diagnosis not present

## 2018-07-23 NOTE — Telephone Encounter (Signed)
I reviewed chart.   I reviewed endocrinology note from earlier in 2018 which had said that no follow-up of thyroid indicated. However--- 07/14/2017--- she had a thyroid ultrasound which was ordered by Dr. Buelah Manis.  This showed a nodule---  that recommended annual follow-up-- So, we need to order the follow up thyroid ultrasound to f/u thyroid nodule

## 2018-07-23 NOTE — Telephone Encounter (Signed)
Patient called and left a message that she is suppose to have a thyroid ultrasound done.Is this something we need to put in it looks like she has an endocrinology?

## 2018-07-24 ENCOUNTER — Other Ambulatory Visit: Payer: Self-pay

## 2018-07-24 DIAGNOSIS — E041 Nontoxic single thyroid nodule: Secondary | ICD-10-CM

## 2018-07-24 NOTE — Telephone Encounter (Signed)
Call placed to patient she is aware the Thyroid US order has been placed

## 2018-07-25 DIAGNOSIS — M4722 Other spondylosis with radiculopathy, cervical region: Secondary | ICD-10-CM | POA: Diagnosis not present

## 2018-07-25 DIAGNOSIS — M542 Cervicalgia: Secondary | ICD-10-CM | POA: Diagnosis not present

## 2018-07-30 DIAGNOSIS — M4712 Other spondylosis with myelopathy, cervical region: Secondary | ICD-10-CM | POA: Diagnosis not present

## 2018-07-30 DIAGNOSIS — M4722 Other spondylosis with radiculopathy, cervical region: Secondary | ICD-10-CM | POA: Diagnosis not present

## 2018-07-30 DIAGNOSIS — M5030 Other cervical disc degeneration, unspecified cervical region: Secondary | ICD-10-CM | POA: Diagnosis not present

## 2018-07-30 DIAGNOSIS — M4802 Spinal stenosis, cervical region: Secondary | ICD-10-CM | POA: Diagnosis not present

## 2018-07-30 DIAGNOSIS — M5020 Other cervical disc displacement, unspecified cervical region: Secondary | ICD-10-CM | POA: Diagnosis not present

## 2018-08-01 DIAGNOSIS — M9902 Segmental and somatic dysfunction of thoracic region: Secondary | ICD-10-CM | POA: Diagnosis not present

## 2018-08-01 DIAGNOSIS — M47812 Spondylosis without myelopathy or radiculopathy, cervical region: Secondary | ICD-10-CM | POA: Diagnosis not present

## 2018-08-01 DIAGNOSIS — M9901 Segmental and somatic dysfunction of cervical region: Secondary | ICD-10-CM | POA: Diagnosis not present

## 2018-08-01 DIAGNOSIS — M546 Pain in thoracic spine: Secondary | ICD-10-CM | POA: Diagnosis not present

## 2018-08-01 DIAGNOSIS — M9903 Segmental and somatic dysfunction of lumbar region: Secondary | ICD-10-CM | POA: Diagnosis not present

## 2018-08-01 DIAGNOSIS — M47816 Spondylosis without myelopathy or radiculopathy, lumbar region: Secondary | ICD-10-CM | POA: Diagnosis not present

## 2018-08-03 ENCOUNTER — Other Ambulatory Visit: Payer: Self-pay | Admitting: Neurosurgery

## 2018-08-03 ENCOUNTER — Ambulatory Visit
Admission: RE | Admit: 2018-08-03 | Discharge: 2018-08-03 | Disposition: A | Discharge status: Home / Self Care | Payer: Medicare Other | Source: Ambulatory Visit | Attending: Physician Assistant | Admitting: Physician Assistant

## 2018-08-03 ENCOUNTER — Other Ambulatory Visit: Payer: Self-pay | Admitting: *Deleted

## 2018-08-03 DIAGNOSIS — E041 Nontoxic single thyroid nodule: Secondary | ICD-10-CM

## 2018-08-03 DIAGNOSIS — E042 Nontoxic multinodular goiter: Secondary | ICD-10-CM | POA: Diagnosis not present

## 2018-08-06 ENCOUNTER — Other Ambulatory Visit: Payer: Medicare Other

## 2018-08-06 DIAGNOSIS — E041 Nontoxic single thyroid nodule: Secondary | ICD-10-CM

## 2018-08-06 LAB — TSH: TSH: 0.64 mIU/L (ref 0.40–4.50)

## 2018-08-06 LAB — T3, FREE: T3, Free: 2.4 pg/mL (ref 2.3–4.2)

## 2018-08-06 LAB — T4, FREE: FREE T4: 1.1 ng/dL (ref 0.8–1.8)

## 2018-08-07 ENCOUNTER — Encounter: Payer: Self-pay | Admitting: *Deleted

## 2018-08-16 ENCOUNTER — Other Ambulatory Visit: Payer: Self-pay | Admitting: Family Medicine

## 2018-08-16 DIAGNOSIS — Z1231 Encounter for screening mammogram for malignant neoplasm of breast: Secondary | ICD-10-CM

## 2018-08-21 NOTE — Pre-Procedure Instructions (Signed)
Kimberly King  08/21/2018      [id number] [place] [address], [place] [address] Phone: [phone] Fax: [phone]  [id number] [place], [place] [address] Phone: [phone] Fax: [phone]    Your procedure is scheduled on Wed., Nov. 20, 2019 from 10:06AM-1:12PM  Report to [facility] Admitting Entrance "A" at 8:00AM  Call this number if you have problems the morning of surgery:  [phone]   Remember:  Do not eat or drink after midnight on Nov. 19th    Take these medicines the morning of surgery with A SIP OF WATER: Fluticasone (FLONASE) and Pantoprazole (PROTONIX)   If needed: Acetaminophen (TYLENOL), SYSTANE eye drops, Ranitidine (ZANTAC), and TraMADol (ULTRAM)   As of today, stop taking all Other Aspirin Products, Vitamins, Fish oils, and Herbal medications. Also stop all NSAIDS i.e. Advil, Ibuprofen, Motrin, Aleve, Anaprox, Naproxen, BC, Goody Powders, and all Supplements. Including: Diclofenac (VOLTAREN)   Do not wear jewelry, make-up or nail polish.  Do not wear lotions, powders, or perfumes, or deodorant.  Do not shave 48 hours prior to surgery.    Do not bring valuables to the hospital.  [facility] is not responsible for any belongings or valuables.  Contacts, dentures or bridgework may not be worn into surgery.  Leave your suitcase in the car.  After surgery it may be brought to your room.  For patients admitted to the hospital, discharge time will be determined by your treatment team.  Patients discharged the day of surgery will not be allowed to drive home.   Special instructions:   [facility]- Preparing For Surgery  Before surgery, you can play an important role. Because skin is not sterile, your skin needs to be as free of germs as possible. You can reduce the number of germs on your skin by washing with  CHG (chlorahexidine gluconate) Soap before surgery.  CHG is an antiseptic cleaner which kills germs and bonds with the skin to continue killing germs even after washing.    Oral Hygiene is also important to reduce your risk of infection.  Remember - BRUSH YOUR TEETH THE MORNING OF SURGERY WITH YOUR REGULAR TOOTHPASTE  Please do not use if you have an allergy to CHG or antibacterial soaps. If your skin becomes reddened/irritated stop using the CHG.  Do not shave (including legs and underarms) for at least 48 hours prior to first CHG shower. It is OK to shave your face.  Please follow these instructions carefully.   1. Shower the NIGHT BEFORE SURGERY and the MORNING OF SURGERY with CHG.   2. If you chose to wash your hair, wash your hair first as usual with your normal shampoo.  3. After you shampoo, rinse your hair and body thoroughly to remove the shampoo.  4. Use CHG as you would any other liquid soap. You can apply CHG directly to the skin and wash gently with a scrungie or a clean washcloth.   5. Apply the CHG Soap to your body ONLY FROM THE NECK DOWN.  Do not use on open wounds or open sores. Avoid contact with your eyes, ears, mouth and genitals (private parts). Wash Face and genitals (private parts)  with your normal soap.  6. Wash thoroughly, paying special attention to the area where your surgery will be performed.  7. Thoroughly rinse your body with warm water from  the neck down.  8. DO NOT shower/wash with your normal soap after using and rinsing off the CHG Soap.  9. Pat yourself dry with a CLEAN TOWEL.  10. Wear CLEAN PAJAMAS to bed the night before surgery, wear comfortable clothes the morning of surgery  11. Place CLEAN SHEETS on your bed the night of your first shower and DO NOT SLEEP WITH PETS.  Day of Surgery:  Do not apply any deodorants/lotions.  Please wear clean clothes to the hospital/surgery center.   Remember to brush your teeth WITH YOUR REGULAR  TOOTHPASTE.  Please read over the following fact sheets that you were given. Pain Booklet, Coughing and Deep Breathing, MRSA Information and Surgical Site Infection Prevention

## 2018-08-22 ENCOUNTER — Encounter (HOSPITAL_COMMUNITY)
Admission: RE | Admit: 2018-08-22 | Discharge: 2018-08-22 | Disposition: A | Discharge status: Home / Self Care | Payer: Medicare Other | Source: Ambulatory Visit | Attending: Neurosurgery | Admitting: Neurosurgery

## 2018-08-22 ENCOUNTER — Other Ambulatory Visit: Payer: Self-pay | Admitting: *Deleted

## 2018-08-22 ENCOUNTER — Encounter (HOSPITAL_COMMUNITY): Payer: Self-pay

## 2018-08-22 DIAGNOSIS — C9110 Chronic lymphocytic leukemia of B-cell type not having achieved remission: Secondary | ICD-10-CM

## 2018-08-22 DIAGNOSIS — Z01812 Encounter for preprocedural laboratory examination: Secondary | ICD-10-CM | POA: Diagnosis not present

## 2018-08-22 HISTORY — DX: Chronic lymphocytic leukemia of B-cell type not having achieved remission: C91.10

## 2018-08-22 LAB — BASIC METABOLIC PANEL WITH GFR
Anion gap: 9 (ref 5–15)
BUN: 17 mg/dL (ref 8–23)
CO2: 26 mmol/L (ref 22–32)
Calcium: 9.3 mg/dL (ref 8.9–10.3)
Chloride: 104 mmol/L (ref 98–111)
Creatinine, Ser: 0.71 mg/dL (ref 0.44–1.00)
GFR calc Af Amer: >60 mL/min (ref >60)
GFR calc non Af Amer: >60 mL/min (ref >60)
Glucose, Bld: 130 mg/dL — ABNORMAL HIGH (ref 70–99)
Potassium: 3.3 mmol/L — ABNORMAL LOW (ref 3.5–5.1)
Sodium: 139 mmol/L (ref 135–145)

## 2018-08-22 LAB — CBC
HCT: 35.5 % — ABNORMAL LOW (ref 36.0–46.0)
Hemoglobin: 10.7 g/dL — ABNORMAL LOW (ref 12.0–15.0)
MCH: 26.9 pg (ref 26.0–34.0)
MCHC: 30.1 g/dL (ref 30.0–36.0)
MCV: 89.2 fL (ref 80.0–100.0)
Platelets: 381 10*3/uL (ref 150–400)
RBC: 3.98 MIL/uL (ref 3.87–5.11)
RDW: 15.5 % (ref 11.5–15.5)
WBC: 16.7 10*3/uL — ABNORMAL HIGH (ref 4.0–10.5)
nRBC: 0 % (ref 0.0–0.2)

## 2018-08-22 LAB — TYPE AND SCREEN
ABO/RH(D): A POS
Antibody Screen: NEGATIVE

## 2018-08-22 LAB — SURGICAL PCR SCREEN
MRSA, PCR: NEGATIVE
Staphylococcus aureus: NEGATIVE

## 2018-08-22 MED ORDER — CHLORHEXIDINE GLUCONATE CLOTH 2 % EX PADS: 6.0000 | MEDICATED_PAD | Freq: Once | CUTANEOUS | Status: DC

## 2018-08-22 NOTE — Telephone Encounter (Signed)
Received fax requesting refill on Tramdol.   Ok to refill??  Last office visit 07/04/2018.  Last refill 06/20/2018.

## 2018-08-24 DIAGNOSIS — M9902 Segmental and somatic dysfunction of thoracic region: Secondary | ICD-10-CM | POA: Diagnosis not present

## 2018-08-24 DIAGNOSIS — M47816 Spondylosis without myelopathy or radiculopathy, lumbar region: Secondary | ICD-10-CM | POA: Diagnosis not present

## 2018-08-24 DIAGNOSIS — M9903 Segmental and somatic dysfunction of lumbar region: Secondary | ICD-10-CM | POA: Diagnosis not present

## 2018-08-24 DIAGNOSIS — M47812 Spondylosis without myelopathy or radiculopathy, cervical region: Secondary | ICD-10-CM | POA: Diagnosis not present

## 2018-08-24 DIAGNOSIS — M9901 Segmental and somatic dysfunction of cervical region: Secondary | ICD-10-CM | POA: Diagnosis not present

## 2018-08-24 DIAGNOSIS — M546 Pain in thoracic spine: Secondary | ICD-10-CM | POA: Diagnosis not present

## 2018-08-24 MED ORDER — TRAMADOL HCL 50 MG PO TABS: 50.0000 mg | ORAL_TABLET | Freq: Two times a day (BID) | ORAL | 0 refills | Status: DC | PRN

## 2018-09-14 ENCOUNTER — Ambulatory Visit (INDEPENDENT_AMBULATORY_CARE_PROVIDER_SITE_OTHER): Payer: Medicare Other

## 2018-09-14 ENCOUNTER — Encounter (INDEPENDENT_AMBULATORY_CARE_PROVIDER_SITE_OTHER): Payer: Self-pay | Admitting: Orthopaedic Surgery

## 2018-09-14 ENCOUNTER — Ambulatory Visit (INDEPENDENT_AMBULATORY_CARE_PROVIDER_SITE_OTHER): Payer: Medicare Other | Admitting: Orthopaedic Surgery

## 2018-09-14 DIAGNOSIS — M79641 Pain in right hand: Secondary | ICD-10-CM

## 2018-09-14 DIAGNOSIS — Z96652 Presence of left artificial knee joint: Secondary | ICD-10-CM

## 2018-09-14 DIAGNOSIS — M79642 Pain in left hand: Secondary | ICD-10-CM | POA: Diagnosis not present

## 2018-09-14 NOTE — Addendum Note (Signed)
Addended by: Precious Bard on: 09/14/2018 10:44 AM   Modules accepted: Orders

## 2018-09-14 NOTE — Progress Notes (Addendum)
Post-Op Visit Note   Patient: Kimberly King           Date of Birth: [birth year]           MRN: [id number] Visit Date: 09/14/2018 PCP: Alycia Rossetti, MD   Assessment & Plan:  Chief Complaint:  Chief Complaint  Patient presents with  . Left Knee - Pain   Visit Diagnoses:  1. S/P TKR (total knee replacement), left     Plan: 6 month TKA follow up plan  Patient now 6 months status post left total knee arthroplasty. Wound is healed with no signs of complications or infection.  The patient does not complain of pain, and is back to normal daily activities. It was reinforced that prophylactic antibiotics should be taken with any procedure including but not limited to dental work or colonoscopies.  We will plan on following up at the 12 month postop visit with 2 view xrays of the operative knee at that time. As always, instructions were given to call with any questions or concerns in the interim.  She does have OA of both hands for which she would like a referral to a rheumatologist for.   She is scheduled to have neck surgery with Dr. Sherwood Gambler in a couple of months  Follow-Up Instructions: Return in about 6 months (around 03/16/2019).   Orders:  Orders Placed This Encounter  Procedures  . XR Knee 1-2 Views Left   No orders of the defined types were placed in this encounter.   Imaging: No results found.  PMFS History: Patient Active Problem List   Diagnosis Date Noted  . Mild hyperlipidemia 05/23/2018  . S/P TKR (total knee replacement), left 03/19/2018  . Obesity (BMI 30-39.9) 12/31/2017  . Primary localized osteoarthritis of knees, bilateral 12/05/2017  . Primary localized osteoarthritis of right hip 12/05/2017  . Migraines 07/01/2017  . Chronic neck and back pain 07/01/2017  . Thyroid nodule 06/30/2017  . GERD (gastroesophageal reflux disease) 06/30/2017  . Vaginal atrophy 06/30/2017  . Subclinical hyperthyroidism 12/06/2016  . CLL (chronic lymphocytic leukemia)  ([place]) 12/17/2013  . COLONIC POLYPS, ADENOMATOUS 10/26/2007   Past Medical History:  Diagnosis Date  . Allergy   . Anxiety   . Arthritis    knees hips neck  . Cancer [facility])    leukemia dx 06/2013 - no current treatment - being monitored  . Cataract    bilateral - being monitored, no treatment currently  . CLL (chronic lymphocytic leukemia) ([place])   . Depression   . GERD (gastroesophageal reflux disease)   . Leukocytosis    and low blood sodium   . Migraines   . Smoker   . Thyroid nodule    JUST WATCHING    Family History  Problem Relation Age of Onset  . Colon cancer Paternal Grandmother 34  . Esophageal cancer Paternal Uncle   . Arthritis Mother   . Heart disease Father   . Lung cancer Father   . Lung cancer Maternal Grandfather   . Breast cancer Maternal Grandmother   . Cirrhosis Brother   . Rectal cancer Neg Hx   . Stomach cancer Neg Hx     Past Surgical History:  Procedure Laterality Date  . APPENDECTOMY    . BLADDER SURGERY     tack  . CERVICAL FUSION     x2  -  C5-C6  . COLONOSCOPY  2014   pyrtle - hx polyps  . DILATION AND CURETTAGE OF UTERUS  x 2  . JOINT REPLACEMENT    . knee     arthroscopy    left   07/2017  . TONSILLECTOMY    . TOTAL KNEE ARTHROPLASTY Left 03/19/2018   Procedure: LEFT TOTAL KNEE ARTHROPLASTY;  Surgeon: Leandrew Koyanagi, MD;  [facility]: [facility];  Service: Orthopedics;  Laterality: Left;   Social History   Occupational History  . Not on file  Tobacco Use  . Smoking status: Former Smoker    Packs/day: 0.25    Years: 30.00    Pack years: 7.50    Types: Cigarettes    Last attempt to quit: 04/22/2015    Years since quitting: 3.4  . Smokeless tobacco: Never Used  Substance and Sexual Activity  . Alcohol use: Yes    Comment: Occasional  . Drug use: No  . Sexual activity: Not on file

## 2018-09-17 ENCOUNTER — Ambulatory Visit (HOSPITAL_COMMUNITY)
Admission: RE | Admit: 2018-09-17 | Discharge: 2018-09-17 | Disposition: A | Discharge status: Home / Self Care | Payer: Medicare Other | Source: Ambulatory Visit | Attending: Family Medicine | Admitting: Family Medicine

## 2018-09-17 DIAGNOSIS — Z1231 Encounter for screening mammogram for malignant neoplasm of breast: Secondary | ICD-10-CM | POA: Insufficient documentation

## 2018-09-19 ENCOUNTER — Other Ambulatory Visit: Payer: Self-pay | Admitting: Family Medicine

## 2018-09-24 DIAGNOSIS — M546 Pain in thoracic spine: Secondary | ICD-10-CM | POA: Diagnosis not present

## 2018-09-24 DIAGNOSIS — M47812 Spondylosis without myelopathy or radiculopathy, cervical region: Secondary | ICD-10-CM | POA: Diagnosis not present

## 2018-09-24 DIAGNOSIS — M47816 Spondylosis without myelopathy or radiculopathy, lumbar region: Secondary | ICD-10-CM | POA: Diagnosis not present

## 2018-09-24 DIAGNOSIS — M9901 Segmental and somatic dysfunction of cervical region: Secondary | ICD-10-CM | POA: Diagnosis not present

## 2018-09-24 DIAGNOSIS — M9903 Segmental and somatic dysfunction of lumbar region: Secondary | ICD-10-CM | POA: Diagnosis not present

## 2018-09-24 DIAGNOSIS — M9902 Segmental and somatic dysfunction of thoracic region: Secondary | ICD-10-CM | POA: Diagnosis not present

## 2018-10-04 NOTE — Pre-Procedure Instructions (Signed)
Kimberly King  10/04/2018      [id number] [address], [place] - [place] [address] Phone: [phone] Fax: [phone]  [id number] - [place], [place] [address] [place] [address] Phone: [phone] Fax: [phone]    Your procedure is scheduled on 10/15/2018.  Report to [facility] Admitting at 0900 A.M.  Call this number if you have problems the morning of surgery:  [phone]   Remember:  Do not eat or drink after midnight.     Take these medicines the morning of surgery with A SIP OF WATER: Acetaminophen (Tylenol) - if needed Fluticasone (Flonase) Pantoprazole (Protonix) Polyethyl Glycol-Propyl Glycol (Systane OP) eye drops - if needed Sumatriptan (Imitrex) - if needed Tramadol (Ultram) - if needed  7 days prior to surgery STOP taking any Diclofenac (Voltaren), Turmeric Curcumin, Aspirin (unless otherwise instructed by your surgeon), Aleve, Naproxen, Ibuprofen, Motrin, Advil, Goody's, BC's, all herbal medications, fish oil, and all vitamins.     Do not wear jewelry, make-up or nail polish.  Do not wear lotions, powders, or perfumes, or deodorant.  Do not shave 48 hours prior to surgery.   Do not bring valuables to the hospital.  [facility] is not responsible for any belongings or valuables.  Contacts, eyeglasses, Hearing aids, dentures or bridgework may not be worn into surgery.  Leave your suitcase in the car.  After surgery it may be brought to your room.  For patients admitted to the hospital, discharge time will be determined by your treatment team.  Patients discharged the day of surgery will not be allowed to drive home.   Name and phone number of your driver:    Special instructions:   [place]- Preparing For Surgery  Before surgery, you can play an important role. Because skin is not sterile, your skin needs to be as free of  germs as possible. You can reduce the number of germs on your skin by washing with CHG (chlorahexidine gluconate) Soap before surgery.  CHG is an antiseptic cleaner which kills germs and bonds with the skin to continue killing germs even after washing.    Oral Hygiene is also important to reduce your risk of infection.  Remember - BRUSH YOUR TEETH THE MORNING OF SURGERY WITH YOUR REGULAR TOOTHPASTE  Please do not use if you have an allergy to CHG or antibacterial soaps. If your skin becomes reddened/irritated stop using the CHG.  Do not shave (including legs and underarms) for at least 48 hours prior to first CHG shower. It is OK to shave your face.  Please follow these instructions carefully.   1. Shower the NIGHT BEFORE SURGERY and the MORNING OF SURGERY with CHG.   2. If you chose to wash your hair, wash your hair first as usual with your normal shampoo.  3. After you shampoo, rinse your hair and body thoroughly to remove the shampoo.  4. Use CHG as you would any other liquid soap. You can apply CHG directly to the skin and wash gently with a scrungie or a clean washcloth.   5. Apply the CHG Soap to your body ONLY FROM THE NECK DOWN.  Do not use on open wounds or open sores. Avoid contact with your eyes, ears, mouth and genitals (private parts). Wash Face and genitals (private parts)  with your normal soap.  6. Wash thoroughly, paying special attention to the area where your  surgery will be performed.  7. Thoroughly rinse your body with warm water from the neck down.  8. DO NOT shower/wash with your normal soap after using and rinsing off the CHG Soap.  9. Pat yourself dry with a CLEAN TOWEL.  10. Wear CLEAN PAJAMAS to bed the night before surgery, wear comfortable clothes the morning of surgery  11. Place CLEAN SHEETS on your bed the night of your first shower and DO NOT SLEEP WITH PETS.    Day of Surgery: Shower as stated above. Do not apply any deodorants/lotions.  Please  wear clean clothes to the hospital/surgery center.   Remember to brush your teeth WITH YOUR REGULAR TOOTHPASTE.    Please read over the following fact sheets that you were given.

## 2018-10-05 ENCOUNTER — Other Ambulatory Visit: Payer: Self-pay

## 2018-10-05 ENCOUNTER — Encounter (HOSPITAL_COMMUNITY)
Admission: RE | Admit: 2018-10-05 | Discharge: 2018-10-05 | Disposition: A | Discharge status: Home / Self Care | Payer: Medicare Other | Source: Ambulatory Visit | Attending: Neurosurgery | Admitting: Neurosurgery

## 2018-10-05 ENCOUNTER — Encounter (HOSPITAL_COMMUNITY): Payer: Self-pay

## 2018-10-05 DIAGNOSIS — Z01812 Encounter for preprocedural laboratory examination: Secondary | ICD-10-CM | POA: Insufficient documentation

## 2018-10-05 LAB — BASIC METABOLIC PANEL
Anion gap: 9 (ref 5–15)
BUN: 16 mg/dL (ref 8–23)
CHLORIDE: 104 mmol/L (ref 98–111)
CO2: 26 mmol/L (ref 22–32)
Calcium: 9.6 mg/dL (ref 8.9–10.3)
Creatinine, Ser: 0.67 mg/dL (ref 0.44–1.00)
GFR calc Af Amer: >60 mL/min (ref >60)
GFR calc non Af Amer: >60 mL/min (ref >60)
Glucose, Bld: 94 mg/dL (ref 70–99)
POTASSIUM: 4.2 mmol/L (ref 3.5–5.1)
SODIUM: 139 mmol/L (ref 135–145)

## 2018-10-05 LAB — CBC
HEMATOCRIT: 37.9 % (ref 36.0–46.0)
HEMOGLOBIN: 11.4 g/dL — AB (ref 12.0–15.0)
MCH: 26.5 pg (ref 26.0–34.0)
MCHC: 30.1 g/dL (ref 30.0–36.0)
MCV: 87.9 fL (ref 80.0–100.0)
NRBC: 0 % (ref 0.0–0.2)
Platelets: 372 10*3/uL (ref 150–400)
RBC: 4.31 MIL/uL (ref 3.87–5.11)
RDW: 15 % (ref 11.5–15.5)
WBC: 15.4 10*3/uL — AB (ref 4.0–10.5)

## 2018-10-05 LAB — TYPE AND SCREEN
ABO/RH(D): A POS
ANTIBODY SCREEN: NEGATIVE

## 2018-10-05 LAB — SURGICAL PCR SCREEN
MRSA, PCR: NEGATIVE
Staphylococcus aureus: NEGATIVE

## 2018-10-05 NOTE — Progress Notes (Signed)
PCP - Dr. Buelah Manis, [facility] Medicine Cardiologist - Patient denies  Chest x-ray - n/a EKG - 12/29/2017 Stress Test - patient states it was many many years ago, 10+, not sure of exact date, no follow up needed ECHO - Patient denies Cardiac Cath - Patient denies  Sleep Study - Patient denies CPAP - n/a  Blood Thinner Instructions: patient instructed to stop Diclofenac, Turmeric and NSAIDS 7 days prior to surgery Aspirin Instructions: n/a  Anesthesia review: n/a  Patient denies shortness of breath, fever, cough and chest pain at PAT appointment   Patient verbalized understanding of instructions that were given to them at the PAT appointment. Patient was also instructed that they will need to review over the PAT instructions again at home before surgery.

## 2018-10-15 ENCOUNTER — Ambulatory Visit (HOSPITAL_COMMUNITY): Payer: Medicare Other | Admitting: Certified Registered Nurse Anesthetist

## 2018-10-15 ENCOUNTER — Encounter (HOSPITAL_COMMUNITY): Payer: Self-pay | Admitting: *Deleted

## 2018-10-15 ENCOUNTER — Ambulatory Visit (HOSPITAL_COMMUNITY): Payer: Medicare Other

## 2018-10-15 ENCOUNTER — Observation Stay (HOSPITAL_COMMUNITY)
Admission: RE | Admit: 2018-10-15 | Discharge: 2018-10-16 | Disposition: A | Discharge status: Home / Self Care | Payer: Medicare Other | Source: Ambulatory Visit | Attending: Neurosurgery | Admitting: Neurosurgery

## 2018-10-15 ENCOUNTER — Encounter (HOSPITAL_COMMUNITY)
Admission: RE | Disposition: A | Discharge status: Home / Self Care | Payer: Self-pay | Source: Ambulatory Visit | Attending: Neurosurgery

## 2018-10-15 DIAGNOSIS — K219 Gastro-esophageal reflux disease without esophagitis: Secondary | ICD-10-CM | POA: Insufficient documentation

## 2018-10-15 DIAGNOSIS — Z79899 Other long term (current) drug therapy: Secondary | ICD-10-CM | POA: Diagnosis not present

## 2018-10-15 DIAGNOSIS — M5011 Cervical disc disorder with radiculopathy,  high cervical region: Secondary | ICD-10-CM | POA: Diagnosis not present

## 2018-10-15 DIAGNOSIS — M50121 Cervical disc disorder at C4-C5 level with radiculopathy: Secondary | ICD-10-CM | POA: Diagnosis not present

## 2018-10-15 DIAGNOSIS — C9110 Chronic lymphocytic leukemia of B-cell type not having achieved remission: Secondary | ICD-10-CM | POA: Insufficient documentation

## 2018-10-15 DIAGNOSIS — M4322 Fusion of spine, cervical region: Secondary | ICD-10-CM | POA: Diagnosis not present

## 2018-10-15 DIAGNOSIS — M5012 Mid-cervical disc disorder, unspecified level: Secondary | ICD-10-CM | POA: Diagnosis not present

## 2018-10-15 DIAGNOSIS — M50122 Cervical disc disorder at C5-C6 level with radiculopathy: Secondary | ICD-10-CM | POA: Diagnosis not present

## 2018-10-15 DIAGNOSIS — Z87891 Personal history of nicotine dependence: Secondary | ICD-10-CM | POA: Insufficient documentation

## 2018-10-15 DIAGNOSIS — M1711 Unilateral primary osteoarthritis, right knee: Secondary | ICD-10-CM | POA: Insufficient documentation

## 2018-10-15 DIAGNOSIS — M5020 Other cervical disc displacement, unspecified cervical region: Secondary | ICD-10-CM | POA: Diagnosis present

## 2018-10-15 DIAGNOSIS — M4722 Other spondylosis with radiculopathy, cervical region: Secondary | ICD-10-CM | POA: Diagnosis not present

## 2018-10-15 DIAGNOSIS — Z419 Encounter for procedure for purposes other than remedying health state, unspecified: Secondary | ICD-10-CM

## 2018-10-15 HISTORY — PX: ANTERIOR CERVICAL DECOMP/DISCECTOMY FUSION: SHX1161

## 2018-10-15 SURGERY — ANTERIOR CERVICAL DECOMPRESSION/DISCECTOMY FUSION 2 LEVEL/HARDWARE REMOVAL
Anesthesia: General | Site: Spine Cervical

## 2018-10-15 MED ORDER — BUPIVACAINE HCL (PF) 0.5 % IJ SOLN
INTRAMUSCULAR | Status: AC
  Filled 2018-10-15: qty 30

## 2018-10-15 MED ORDER — LIDOCAINE-EPINEPHRINE 1 %-1:100000 IJ SOLN
INTRAMUSCULAR | Status: DC | PRN
  Administered 2018-10-15: 20 mL

## 2018-10-15 MED ORDER — HEMOSTATIC AGENTS (NO CHARGE) OPTIME
TOPICAL | Status: DC | PRN
  Administered 2018-10-15: 1

## 2018-10-15 MED ORDER — SODIUM CHLORIDE 0.9 % IV SOLN
INTRAVENOUS | Status: DC | PRN
  Administered 2018-10-15: 13:00:00

## 2018-10-15 MED ORDER — MORPHINE SULFATE (PF) 4 MG/ML IV SOLN: 4.0000 mg | INTRAVENOUS | Status: DC | PRN

## 2018-10-15 MED ORDER — MAGNESIUM HYDROXIDE 400 MG/5ML PO SUSP: 30.0000 mL | Freq: Every day | ORAL | Status: DC | PRN

## 2018-10-15 MED ORDER — MIDAZOLAM HCL 5 MG/5ML IJ SOLN
INTRAMUSCULAR | Status: DC | PRN
  Administered 2018-10-15: 2 mg via INTRAVENOUS

## 2018-10-15 MED ORDER — HYDROCODONE-ACETAMINOPHEN 5-325 MG PO TABS
1.0000 | ORAL_TABLET | ORAL | Status: DC | PRN
  Administered 2018-10-15 – 2018-10-16 (×3): 2 via ORAL
  Filled 2018-10-15 (×2): qty 2

## 2018-10-15 MED ORDER — THROMBIN 5000 UNITS EX SOLR
CUTANEOUS | Status: AC
  Filled 2018-10-15: qty 15000

## 2018-10-15 MED ORDER — SODIUM CHLORIDE 0.9 % IV SOLN: 250.0000 mL | INTRAVENOUS | Status: DC

## 2018-10-15 MED ORDER — PHENYLEPHRINE 40 MCG/ML (10ML) SYRINGE FOR IV PUSH (FOR BLOOD PRESSURE SUPPORT)
PREFILLED_SYRINGE | INTRAVENOUS | Status: AC
  Filled 2018-10-15: qty 10

## 2018-10-15 MED ORDER — CYCLOBENZAPRINE HCL 5 MG PO TABS
5.0000 mg | ORAL_TABLET | Freq: Three times a day (TID) | ORAL | Status: DC | PRN
  Administered 2018-10-15: 10 mg via ORAL

## 2018-10-15 MED ORDER — THROMBIN 5000 UNITS EX SOLR
OROMUCOSAL | Status: DC | PRN
  Administered 2018-10-15: 13:00:00

## 2018-10-15 MED ORDER — DEXAMETHASONE SODIUM PHOSPHATE 10 MG/ML IJ SOLN
INTRAMUSCULAR | Status: DC | PRN
  Administered 2018-10-15: 10 mg via INTRAVENOUS

## 2018-10-15 MED ORDER — FENTANYL CITRATE (PF) 100 MCG/2ML IJ SOLN: 25.0000 ug | INTRAMUSCULAR | Status: DC | PRN

## 2018-10-15 MED ORDER — ROCURONIUM BROMIDE 100 MG/10ML IV SOLN
INTRAVENOUS | Status: DC | PRN
  Administered 2018-10-15: 20 mg via INTRAVENOUS
  Administered 2018-10-15: 10 mg via INTRAVENOUS
  Administered 2018-10-15: 20 mg via INTRAVENOUS
  Administered 2018-10-15: 50 mg via INTRAVENOUS
  Administered 2018-10-15: 10 mg via INTRAVENOUS

## 2018-10-15 MED ORDER — KETOROLAC TROMETHAMINE 30 MG/ML IJ SOLN
15.0000 mg | Freq: Once | INTRAMUSCULAR | Status: AC
  Administered 2018-10-15: 15 mg via INTRAVENOUS

## 2018-10-15 MED ORDER — FENTANYL CITRATE (PF) 100 MCG/2ML IJ SOLN
INTRAMUSCULAR | Status: DC | PRN
  Administered 2018-10-15: 50 ug via INTRAVENOUS
  Administered 2018-10-15: 100 ug via INTRAVENOUS
  Administered 2018-10-15 (×3): 25 ug via INTRAVENOUS

## 2018-10-15 MED ORDER — LACTATED RINGERS IV SOLN
INTRAVENOUS | Status: DC
  Administered 2018-10-15 (×3): via INTRAVENOUS

## 2018-10-15 MED ORDER — ACETAMINOPHEN 10 MG/ML IV SOLN
INTRAVENOUS | Status: AC
  Filled 2018-10-15: qty 100

## 2018-10-15 MED ORDER — PHENYLEPHRINE HCL 10 MG/ML IJ SOLN
INTRAMUSCULAR | Status: DC | PRN
  Administered 2018-10-15 (×6): 80 ug via INTRAVENOUS

## 2018-10-15 MED ORDER — PANTOPRAZOLE SODIUM 40 MG PO TBEC: 40.0000 mg | DELAYED_RELEASE_TABLET | Freq: Every day | ORAL | Status: DC

## 2018-10-15 MED ORDER — KETOROLAC TROMETHAMINE 30 MG/ML IJ SOLN
INTRAMUSCULAR | Status: AC
  Filled 2018-10-15: qty 1

## 2018-10-15 MED ORDER — PHENOL 1.4 % MT LIQD: 1.0000 | OROMUCOSAL | Status: DC | PRN

## 2018-10-15 MED ORDER — BISACODYL 10 MG RE SUPP: 10.0000 mg | Freq: Every day | RECTAL | Status: DC | PRN

## 2018-10-15 MED ORDER — ACETAMINOPHEN 325 MG PO TABS: 650.0000 mg | ORAL_TABLET | ORAL | Status: DC | PRN

## 2018-10-15 MED ORDER — BUPIVACAINE HCL (PF) 0.5 % IJ SOLN
INTRAMUSCULAR | Status: DC | PRN
  Administered 2018-10-15: 20 mL

## 2018-10-15 MED ORDER — ROCURONIUM BROMIDE 50 MG/5ML IV SOSY
PREFILLED_SYRINGE | INTRAVENOUS | Status: AC
  Filled 2018-10-15: qty 15

## 2018-10-15 MED ORDER — CEFAZOLIN SODIUM-DEXTROSE 2-4 GM/100ML-% IV SOLN
2.0000 g | INTRAVENOUS | Status: AC
  Administered 2018-10-15: 2 g via INTRAVENOUS
  Filled 2018-10-15: qty 100

## 2018-10-15 MED ORDER — FLUTICASONE PROPIONATE 50 MCG/ACT NA SUSP
1.0000 | Freq: Every day | NASAL | Status: DC
  Filled 2018-10-15: qty 16

## 2018-10-15 MED ORDER — SODIUM CHLORIDE 0.9% FLUSH: 3.0000 mL | INTRAVENOUS | Status: DC | PRN

## 2018-10-15 MED ORDER — SODIUM CHLORIDE 0.9% FLUSH
3.0000 mL | Freq: Two times a day (BID) | INTRAVENOUS | Status: DC
  Administered 2018-10-15: 3 mL via INTRAVENOUS

## 2018-10-15 MED ORDER — ACETAMINOPHEN 10 MG/ML IV SOLN
INTRAVENOUS | Status: DC | PRN
  Administered 2018-10-15: 1000 mg via INTRAVENOUS

## 2018-10-15 MED ORDER — THROMBIN 5000 UNITS EX SOLR
CUTANEOUS | Status: DC | PRN
  Administered 2018-10-15 (×2): 5000 [IU] via TOPICAL

## 2018-10-15 MED ORDER — MIDAZOLAM HCL 2 MG/2ML IJ SOLN
INTRAMUSCULAR | Status: AC
  Filled 2018-10-15: qty 2

## 2018-10-15 MED ORDER — ONDANSETRON HCL 4 MG/2ML IJ SOLN
INTRAMUSCULAR | Status: DC | PRN
  Administered 2018-10-15: 4 mg via INTRAVENOUS

## 2018-10-15 MED ORDER — MENTHOL 3 MG MT LOZG
1.0000 | LOZENGE | OROMUCOSAL | Status: DC | PRN
  Filled 2018-10-15: qty 9

## 2018-10-15 MED ORDER — KETOROLAC TROMETHAMINE 30 MG/ML IJ SOLN
15.0000 mg | Freq: Four times a day (QID) | INTRAMUSCULAR | Status: DC
  Administered 2018-10-15 – 2018-10-16 (×2): 15 mg via INTRAVENOUS
  Filled 2018-10-15 (×2): qty 1

## 2018-10-15 MED ORDER — FLEET ENEMA 7-19 GM/118ML RE ENEM: 1.0000 | ENEMA | Freq: Once | RECTAL | Status: DC | PRN

## 2018-10-15 MED ORDER — HYDROCODONE-ACETAMINOPHEN 5-325 MG PO TABS
ORAL_TABLET | ORAL | Status: AC
  Filled 2018-10-15: qty 2

## 2018-10-15 MED ORDER — LIDOCAINE-EPINEPHRINE 1 %-1:100000 IJ SOLN
INTRAMUSCULAR | Status: AC
  Filled 2018-10-15: qty 1

## 2018-10-15 MED ORDER — FAMOTIDINE 20 MG PO TABS
20.0000 mg | ORAL_TABLET | Freq: Every day | ORAL | Status: DC
  Administered 2018-10-15: 20 mg via ORAL
  Filled 2018-10-15: qty 1

## 2018-10-15 MED ORDER — PROPOFOL 10 MG/ML IV BOLUS
INTRAVENOUS | Status: DC | PRN
  Administered 2018-10-15: 160 mg via INTRAVENOUS

## 2018-10-15 MED ORDER — LIDOCAINE HCL (CARDIAC) PF 100 MG/5ML IV SOSY
PREFILLED_SYRINGE | INTRAVENOUS | Status: DC | PRN
  Administered 2018-10-15: 60 mg via INTRAVENOUS

## 2018-10-15 MED ORDER — CYCLOBENZAPRINE HCL 10 MG PO TABS
ORAL_TABLET | ORAL | Status: AC
  Filled 2018-10-15: qty 1

## 2018-10-15 MED ORDER — FENTANYL CITRATE (PF) 100 MCG/2ML IJ SOLN
INTRAMUSCULAR | Status: AC
  Filled 2018-10-15: qty 2

## 2018-10-15 MED ORDER — FENTANYL CITRATE (PF) 250 MCG/5ML IJ SOLN
INTRAMUSCULAR | Status: AC
  Filled 2018-10-15: qty 5

## 2018-10-15 MED ORDER — FENTANYL CITRATE (PF) 100 MCG/2ML IJ SOLN
25.0000 ug | INTRAMUSCULAR | Status: DC | PRN
  Administered 2018-10-15: 50 ug via INTRAVENOUS

## 2018-10-15 MED ORDER — ALUM & MAG HYDROXIDE-SIMETH 200-200-20 MG/5ML PO SUSP: 30.0000 mL | Freq: Four times a day (QID) | ORAL | Status: DC | PRN

## 2018-10-15 MED ORDER — KCL IN DEXTROSE-NACL 20-5-0.45 MEQ/L-%-% IV SOLN: INTRAVENOUS | Status: DC

## 2018-10-15 MED ORDER — HYDROXYZINE HCL 50 MG/ML IM SOLN: 50.0000 mg | INTRAMUSCULAR | Status: DC | PRN

## 2018-10-15 MED ORDER — 0.9 % SODIUM CHLORIDE (POUR BTL) OPTIME
TOPICAL | Status: DC | PRN
  Administered 2018-10-15: 1000 mL

## 2018-10-15 MED ORDER — ACETAMINOPHEN 650 MG RE SUPP: 650.0000 mg | RECTAL | Status: DC | PRN

## 2018-10-15 MED ORDER — SUGAMMADEX SODIUM 200 MG/2ML IV SOLN
INTRAVENOUS | Status: DC | PRN
  Administered 2018-10-15: 200 mg via INTRAVENOUS
  Administered 2018-10-15: 100 mg via INTRAVENOUS

## 2018-10-15 MED ORDER — HYDROXYZINE HCL 25 MG PO TABS
50.0000 mg | ORAL_TABLET | ORAL | Status: DC | PRN
  Administered 2018-10-15: 50 mg via ORAL
  Filled 2018-10-15: qty 2

## 2018-10-15 SURGICAL SUPPLY — 68 items
ADH SKN CLS APL DERMABOND .7 (GAUZE/BANDAGES/DRESSINGS) ×1
ALLOGRAFT 7X14X11 (Bone Implant) ×1 IMPLANT
ALLOGRAFT CA 6X14X11 (Bone Implant) ×1 IMPLANT
BAG DECANTER FOR FLEXI CONT (MISCELLANEOUS) ×2 IMPLANT
BIT DRILL 12X2.5XAVTR (BIT) IMPLANT
BIT DRILL AVIATOR 12 (BIT) ×2
BIT DRILL NEURO 2X3.1 SFT TUCH (MISCELLANEOUS) ×1 IMPLANT
BIT DRL 12X2.5XAVTR (BIT) ×1
BLADE ULTRA TIP 2M (BLADE) IMPLANT
BUR PRESCISION 1.7 ELITE (BURR) IMPLANT
CANISTER SUCT 3000ML PPV (MISCELLANEOUS) ×2 IMPLANT
CARTRIDGE OIL MAESTRO DRILL (MISCELLANEOUS) ×1 IMPLANT
COVER MAYO STAND STRL (DRAPES) ×2 IMPLANT
COVER WAND RF STERILE (DRAPES) ×2 IMPLANT
DECANTER SPIKE VIAL GLASS SM (MISCELLANEOUS) ×2 IMPLANT
DERMABOND ADVANCED (GAUZE/BANDAGES/DRESSINGS) ×1
DERMABOND ADVANCED .7 DNX12 (GAUZE/BANDAGES/DRESSINGS) ×1 IMPLANT
DIFFUSER DRILL AIR PNEUMATIC (MISCELLANEOUS) ×2 IMPLANT
DRAPE HALF SHEET 40X57 (DRAPES) IMPLANT
DRAPE LAPAROTOMY 100X72 PEDS (DRAPES) ×2 IMPLANT
DRAPE MICROSCOPE LEICA (MISCELLANEOUS) ×2 IMPLANT
DRAPE POUCH INSTRU U-SHP 10X18 (DRAPES) ×2 IMPLANT
DRILL NEURO 2X3.1 SOFT TOUCH (MISCELLANEOUS) ×2
ELECT COATED BLADE 2.86 ST (ELECTRODE) ×2 IMPLANT
ELECT REM PT RETURN 9FT ADLT (ELECTROSURGICAL) ×2
ELECTRODE REM PT RTRN 9FT ADLT (ELECTROSURGICAL) ×1 IMPLANT
GLOVE BIO SURGEON STRL SZ7.5 (GLOVE) ×1 IMPLANT
GLOVE BIOGEL M 6.5 STRL (GLOVE) ×4 IMPLANT
GLOVE BIOGEL PI IND STRL 7.0 (GLOVE) IMPLANT
GLOVE BIOGEL PI IND STRL 7.5 (GLOVE) IMPLANT
GLOVE BIOGEL PI IND STRL 8 (GLOVE) ×1 IMPLANT
GLOVE BIOGEL PI INDICATOR 7.0 (GLOVE) ×1
GLOVE BIOGEL PI INDICATOR 7.5 (GLOVE) ×1
GLOVE BIOGEL PI INDICATOR 8 (GLOVE) ×1
GLOVE ECLIPSE 7.5 STRL STRAW (GLOVE) ×2 IMPLANT
GLOVE EXAM NITRILE XL STR (GLOVE) IMPLANT
GOWN STRL REUS W/ TWL LRG LVL3 (GOWN DISPOSABLE) IMPLANT
GOWN STRL REUS W/ TWL XL LVL3 (GOWN DISPOSABLE) ×1 IMPLANT
GOWN STRL REUS W/TWL 2XL LVL3 (GOWN DISPOSABLE) ×1 IMPLANT
GOWN STRL REUS W/TWL LRG LVL3 (GOWN DISPOSABLE) ×4
GOWN STRL REUS W/TWL XL LVL3 (GOWN DISPOSABLE) ×2
HALTER HD/CHIN CERV TRACTION D (MISCELLANEOUS) ×2 IMPLANT
HEMOSTAT POWDER KIT SURGIFOAM (HEMOSTASIS) ×2 IMPLANT
KIT BASIN OR (CUSTOM PROCEDURE TRAY) ×2 IMPLANT
KIT TURNOVER KIT B (KITS) ×2 IMPLANT
NDL HYPO 25X1 1.5 SAFETY (NEEDLE) ×1 IMPLANT
NDL SPNL 22GX3.5 QUINCKE BK (NEEDLE) ×1 IMPLANT
NEEDLE HYPO 25X1 1.5 SAFETY (NEEDLE) ×2 IMPLANT
NEEDLE SPNL 22GX3.5 QUINCKE BK (NEEDLE) ×2 IMPLANT
NS IRRIG 1000ML POUR BTL (IV SOLUTION) ×2 IMPLANT
OIL CARTRIDGE MAESTRO DRILL (MISCELLANEOUS) ×2
PACK LAMINECTOMY NEURO (CUSTOM PROCEDURE TRAY) ×2 IMPLANT
PAD ARMBOARD 7.5X6 YLW CONV (MISCELLANEOUS) ×6 IMPLANT
PLATE AVIATOR ASSY 2LVL SZ 34 (Plate) ×1 IMPLANT
RASP 3.0MM (RASP) ×1 IMPLANT
RUBBERBAND STERILE (MISCELLANEOUS) ×4 IMPLANT
SCREW AVIAT VAR SLFTAP 4.35X12 (Screw) ×2 IMPLANT
SCREW AVIAT VAR SLFTAP 4.35X14 (Screw) ×2 IMPLANT
SCREW AVIATOR VAR SELFTAP 4X12 (Screw) ×1 IMPLANT
SCREW AVIATOR VAR SELFTAP 4X14 (Screw) ×1 IMPLANT
SPONGE INTESTINAL PEANUT (DISPOSABLE) ×2 IMPLANT
SPONGE SURGIFOAM ABS GEL 100 (HEMOSTASIS) ×2 IMPLANT
STAPLER SKIN PROX WIDE 3.9 (STAPLE) IMPLANT
SUT VIC AB 2-0 CP2 18 (SUTURE) ×2 IMPLANT
SUT VIC AB 3-0 SH 8-18 (SUTURE) ×2 IMPLANT
TOWEL GREEN STERILE (TOWEL DISPOSABLE) ×2 IMPLANT
TOWEL GREEN STERILE FF (TOWEL DISPOSABLE) ×2 IMPLANT
WATER STERILE IRR 1000ML POUR (IV SOLUTION) ×2 IMPLANT

## 2018-10-15 NOTE — H&P (Signed)
Subjective: Patient is a 68 y.o. right-handed white female who is admitted for treatment of multilevel cervical spondylitic disc herniation, cervical spondylosis, cervical degenerative disease, and bilateral cervical radiculopathy.  Patient is status post a C5-6 and C6-7 ACDF with tether cervical plating in 2008; she developed nonunion pseudoarthrosis, contributed to by a smoking habit, and required a C5-7 posterior cervical arthrodesis.  Both fusions went on and healed well.  She is been having difficulties with her neck for the past 4 years, with increasing posterior neck pain and numbness and tilling through the upper extremities bilaterally.  X-rays and MRI scan of showed progressive arthritic degeneration at the C3-4 and C4-5 levels, with spondylitic disc herniation, spondylosis, and degenerative disc disease resulting in canal stenosis, but no alteration of cord signal.  The patient is admitted now for a two-level C3-4 and C4-5 anterior cervical decompression arthrodesis with structural allograft and cervical plating.  We will be removing the upper portion of her existing anterior cervical plate.   Patient Active Problem List   Diagnosis Date Noted  . Mild hyperlipidemia 05/23/2018  . S/P TKR (total knee replacement), left 03/19/2018  . Obesity (BMI 30-39.9) 12/31/2017  . Primary localized osteoarthritis of knees, bilateral 12/05/2017  . Primary localized osteoarthritis of right hip 12/05/2017  . Migraines 07/01/2017  . Chronic neck and back pain 07/01/2017  . Thyroid nodule 06/30/2017  . GERD (gastroesophageal reflux disease) 06/30/2017  . Vaginal atrophy 06/30/2017  . Subclinical hyperthyroidism 12/06/2016  . CLL (chronic lymphocytic leukemia) ([place]) 12/17/2013  . COLONIC POLYPS, ADENOMATOUS 10/26/2007   Past Medical History:  Diagnosis Date  . Allergy   . Anxiety   . Arthritis    knees hips neck  . Cancer [facility])    leukemia dx 06/2013 - no current treatment - being monitored  .  Cataract    bilateral - being monitored, no treatment currently  . CLL (chronic lymphocytic leukemia) ([place])   . Depression   . GERD (gastroesophageal reflux disease)   . Leukocytosis    and low blood sodium   . Migraines   . Thyroid nodule    JUST WATCHING    Past Surgical History:  Procedure Laterality Date  . APPENDECTOMY    . BLADDER SURGERY     tack  . CERVICAL FUSION     x2  -  C5-C6  . COLONOSCOPY  2014   pyrtle - hx polyps  . DILATION AND CURETTAGE OF UTERUS     x 2  . JOINT REPLACEMENT    . knee     arthroscopy    left   07/2017  . TONSILLECTOMY    . TOTAL KNEE ARTHROPLASTY Left 03/19/2018   Procedure: LEFT TOTAL KNEE ARTHROPLASTY;  Surgeon: Leandrew Koyanagi, MD;  Location: [facility];  Service: Orthopedics;  Laterality: Left;    Medications Prior to Admission  Medication Sig Dispense Refill Last Dose  . acetaminophen (TYLENOL) 325 MG tablet Take 650 mg by mouth every 6 (six) hours as needed for moderate pain or headache.   10/15/2018 at 0630  . DENTA 5000 PLUS 1.1 % CREA dental cream Place 1 application onto teeth at bedtime. brush teeth for 2 minutes and spit out do not rinse  2 10/14/2018 at Unknown time  . diclofenac (VOLTAREN) 75 MG EC tablet Take 75 mg by mouth daily.  3 Past Week at Unknown time  . fluticasone (FLONASE) 50 MCG/ACT nasal spray Place 1 spray into both nostrils daily.    10/15/2018 at 0630  .  GLUCOSAMINE-CHONDROITIN PO Take 1 tablet by mouth daily.    Past Month at Unknown time  . Lifitegrast (XIIDRA) 5 % SOLN Place 1 drop into both eyes once a week.    Past Month at Unknown time  . pantoprazole (PROTONIX) 40 MG tablet TAKE 1 TABLET BY MOUTH EVERY DAY (Patient taking differently: Take 40 mg by mouth daily. ) 90 tablet 3 10/15/2018 at 0630  . Polyethyl Glycol-Propyl Glycol (SYSTANE OP) Place 1 drop into both eyes 2 (two) times daily as needed (for dry eyes).    10/15/2018 at 0630  . ranitidine (ZANTAC) 150 MG tablet TAKE 1 TABLET (150 MG TOTAL) BY MOUTH AT BEDTIME. 90  tablet 2 10/14/2018 at Unknown time  . Specialty Vitamins Products (ICAPS LUTEIN & ZEAXANTHIN PO) Take 1 capsule by mouth daily.   Past Week at Unknown time  . traMADol (ULTRAM) 50 MG tablet Take 1 tablet (50 mg total) by mouth 2 (two) times daily as needed for moderate pain. 120 tablet 0 10/14/2018 at Unknown time  . TURMERIC CURCUMIN PO Take 1 capsule by mouth daily.   Past Week at Unknown time  . conjugated estrogens (PREMARIN) vaginal cream Place 1 Applicatorful vaginally daily. (Patient not taking: Reported on 08/16/2018) 42.5 g 12 Not Taking at Unknown time  . SUMAtriptan (IMITREX) 50 MG tablet Take 50 mg by mouth every 2 (two) hours as needed for migraine.    More than a month at Unknown time   No Known Allergies  Social History   Tobacco Use  . Smoking status: Former Smoker    Packs/day: 0.25    Years: 30.00    Pack years: 7.50    Types: Cigarettes    Last attempt to quit: 04/22/2015    Years since quitting: 3.4  . Smokeless tobacco: Never Used  Substance Use Topics  . Alcohol use: Yes    Comment: Occasional    Family History  Problem Relation Age of Onset  . Colon cancer Paternal Grandmother 2  . Esophageal cancer Paternal Uncle   . Arthritis Mother   . Heart disease Father   . Lung cancer Father   . Lung cancer Maternal Grandfather   . Breast cancer Maternal Grandmother   . Cirrhosis Brother   . Rectal cancer Neg Hx   . Stomach cancer Neg Hx      Review of Systems Pertinent items noted in HPI and remainder of comprehensive ROS otherwise negative.  Objective: Vital signs in last 24 hours: Temp:  [98.8 F (37.1 C)] 98.8 F (37.1 C) (01/06 0925) Pulse Rate:  [83] 83 (01/06 0925) Resp:  [18] 18 (01/06 0925) BP: (160)/(84) 160/84 (01/06 0925) SpO2:  [99 %] 99 % (01/06 0925) Weight:  [79.4 kg] 79.4 kg (01/06 0925)  EXAM: Patient is well-developed well-nourished white female in no acute distress.   Lungs are clear to auscultation , the patient has symmetrical  respiratory excursion. Heart has a regular rate and rhythm normal S1 and S2 no murmur.   Abdomen is soft nontender nondistended bowel sounds are present. Extremity examination shows no clubbing cyanosis or edema. Motor examination shows 5 over 5 strength in the upper extremities including the deltoid biceps triceps and intrinsics and grip. Sensation is intact to pinprick throughout the digits of the upper extremities. Reflexes are symmetrical and without evidence of pathologic reflexes. Patient has a normal gait and stance.   Data Review:CBC    Component Value Date/Time   WBC 15.4 (H) 10/05/2018 0833   RBC  4.31 10/05/2018 0833   HGB 11.4 (L) 10/05/2018 0833   HGB 10.9 (L) 06/25/2018 1316   HGB 11.3 (L) 07/10/2017 1316   HCT 37.9 10/05/2018 0833   HCT 35.6 07/10/2017 1316   PLT 372 10/05/2018 0833   PLT 309 06/25/2018 1316   PLT 315 07/10/2017 1316   MCV 87.9 10/05/2018 0833   MCV 88.1 07/10/2017 1316   MCH 26.5 10/05/2018 0833   MCHC 30.1 10/05/2018 0833   RDW 15.0 10/05/2018 0833   RDW 15.7 (H) 07/10/2017 1316   LYMPHSABS 10.2 (H) 06/25/2018 1316   LYMPHSABS 8.9 (H) 07/10/2017 1316   MONOABS 1.0 (H) 06/25/2018 1316   MONOABS 1.0 (H) 07/10/2017 1316   EOSABS 0.3 06/25/2018 1316   EOSABS 0.3 07/10/2017 1316   BASOSABS 0.1 06/25/2018 1316   BASOSABS 0.1 07/10/2017 1316                          BMET    Component Value Date/Time   NA 139 10/05/2018 0833   NA 139 06/27/2017 1324   K 4.2 10/05/2018 0833   K 3.5 06/27/2017 1324   CL 104 10/05/2018 0833   CO2 26 10/05/2018 0833   CO2 26 06/27/2017 1324   GLUCOSE 94 10/05/2018 0833   GLUCOSE 132 06/27/2017 1324   BUN 16 10/05/2018 0833   BUN 22.4 06/27/2017 1324   CREATININE 0.67 10/05/2018 0833   CREATININE 0.91 05/07/2018 1255   CREATININE 0.8 06/27/2017 1324   CALCIUM 9.6 10/05/2018 0833   CALCIUM 9.7 06/27/2017 1324   GFRNONAA >60 10/05/2018 0833   GFRNONAA 66 05/07/2018 1255   GFRAA >60 10/05/2018 0833   GFRAA 76  05/07/2018 1255     Assessment/Plan: Patient with advanced degeneration at the C3-4 and C4-5 levels who is admitted for two-level C3-4 and C4-5 ACDF with partial removal of the upper portion of her existing anterior cervical plate.  I've discussed with the patient the nature of his condition, the nature the surgical procedure, the typical length of surgery, hospital stay, and overall recuperation. We discussed limitations postoperatively. I discussed risks of surgery including risks of infection, bleeding, possibly need for transfusion, the risk of nerve root dysfunction with pain, weakness, numbness, or paresthesias, the risk of spinal cord dysfunction with paralysis of all 4 limbs and quadriplegia, and the risk of dural tear and CSF leakage and possible need for further surgery, the risk of esophageal dysfunction causing dysphagia and the risk of laryngeal dysfunction causing hoarseness of the voice, the risk of failure of the arthrodesis and the possible need for further surgery, and the risk of anesthetic complications including myocardial infarction, stroke, pneumonia, and death. We also discussed the need for postoperative immobilization in a cervical collar. Understanding all this the patient does wish to proceed with surgery and is admitted for such.    Hosie Spangle, MD 10/15/2018 11:41 AM

## 2018-10-15 NOTE — Op Note (Signed)
10/15/2018  2:42 PM  PATIENT:  Kimberly King  68 y.o. female  PRE-OPERATIVE DIAGNOSIS: C3-4 and C4-5 spondylitic cervical disc herniation, cervical spondylosis, cervical degenerative disease, cervical radiculopathy  POST-OPERATIVE DIAGNOSIS:  C3-4 and C4-5 spondylitic cervical disc herniation, cervical spondylosis, cervical degenerative disease, cervical radiculopathy  PROCEDURE:  Procedure(s):  1) removal of superior portion of existing C5-C7 tethered cervical plate; 2) C3-4 and C4-5 anterior cervical decompression arthrodesis with structural allograft and aviator cervical plating  SURGEON: Jovita Gamma, MD  ASSISTANTS: Emelda Brothers, MD  ANESTHESIA:   general  EBL:  Total I/O In: -  Out: 150 [Blood:150]  BLOOD ADMINISTERED:none  COUNT:  Correct per nursing staff  DICTATION: Patient was brought to the operating room placed under general endotracheal anesthesia. Patient was placed in 10 pounds of halter traction. The neck was prepped with Betadine soap and solution and draped in a sterile fashion. A horizontal incision was made on the left side of the neck. The line of the incision was infiltrated with local anesthetic with epinephrine. Dissection was carried down thru the subcutaneous tissue and platysma, bipolar cautery was used to maintain hemostasis. Dissection was then carried out thru an avascular plane leaving the sternocleidomastoid carotid artery and jugular vein laterally and the trachea and esophagus medially. The ventral aspect of the vertebral column was identified and we identified the superior aspect of the existing C5-C7 tether anterior cervical plate.  Scar tissue overlying it was cleaned away, and then using the metal cutting bur we cut the upper 2 arms of the plate.  We then remove the superior third of the plate and its 2 screws.  We then identified the C3-4 and C4-5 anterior annuluses.  The annulus at each level was incised and the disc space entered. Discectomy  was performed with micro-curettes and pituitary rongeurs. The operating microscope was draped and brought into the field provided additional magnification illumination and visualization. Discectomy was continued posteriorly thru the disc space and then the cartilaginous endplate was removed using micro-curettes along with the high-speed drill. Posterior osteophytic overgrowth was removed each level using the high-speed drill along with a 2 mm thin footplated Kerrison punch. Posterior longitudinal ligament along with disc herniation was carefully removed, decompressing the spinal canal and thecal sac. We then continued to remove osteophytic overgrowth and disc material decompressing the neural foramina and exiting nerve roots bilaterally. Once the decompression was completed hemostasis was established at each level with the use of Gelfoam with thrombin and bipolar cautery. The Gelfoam was removed, a thin layer of Surgifoam applied, the wound irrigated and hemostasis confirmed. We then measured the height of the intravertebral disc space level and selected a 7 millimeter in height structural allograft for the C3-4 level and a 6 millimeter in height structural allograft for the C4-5 level . Each was hydrated and saline solution and then gently positioned in the intravertebral disc space and countersunk. We then selected a 34 millimeter in height Aviator cervical plate. It was positioned over the fusion construct and secured to the vertebra with a pair of 4.35 x 12 mm screws at the 3 level, a 4 x 12 mm and a 4 x 14 mm screws at the C4 level, and a pair of 4.35 x 14 mm screws at the C5 level.  We used the existing screw holes at C5.  The other screw holes were started with the high-speed drill and then the screws placed, once all the screws were placed, the locking system was secured.  Overall  the vertebral body bone had decreased density, suggestive of being osteoporotic.  The wound was irrigated with bacitracin  solution checked for hemostasis which was established and confirmed. An x-ray was taken which showed the grafts in good physician, the plate and screws in condition, and the overall construct looked good. We then proceeded with closure. The platysma was closed with interrupted inverted 2-0 undyed Vicryl suture, the subcutaneous and subcuticular closed with interrupted inverted 3-0 undyed Vicryl suture. The skin edges were approximated with Dermabond. Following surgery the patient was taken out of cervical traction. To be reversed and the anesthetic and taken to the recovery room for further care.  PLAN OF CARE: Admit for overnight observation  PATIENT DISPOSITION:  PACU - hemodynamically stable.   Delay start of Pharmacological VTE agent (>24hrs) due to surgical blood loss or risk of bleeding:  yes

## 2018-10-15 NOTE — Anesthesia Procedure Notes (Signed)
Procedure Name: Intubation Date/Time: 10/15/2018 11:58 AM Performed by: Oletta Lamas, CRNA Pre-anesthesia Checklist: Patient identified, Emergency Drugs available, Suction available and Patient being monitored Patient Re-evaluated:Patient Re-evaluated prior to induction Oxygen Delivery Method: Circle System Utilized Preoxygenation: Pre-oxygenation with 100% oxygen Induction Type: IV induction Ventilation: Mask ventilation without difficulty Laryngoscope Size: Miller and 2 Grade View: Grade I Tube type: Oral Tube size: 7.0 mm Number of attempts: 1 Airway Equipment and Method: Stylet and Oral airway Placement Confirmation: ETT inserted through vocal cords under direct vision,  positive ETCO2 and breath sounds checked- equal and bilateral Secured at: 22 cm Tube secured with: Tape Dental Injury: Teeth and Oropharynx as per pre-operative assessment

## 2018-10-15 NOTE — Transfer of Care (Signed)
Immediate Anesthesia Transfer of Care Note  Patient: Kimberly King  Procedure(s) Performed: PARTIAL REMOVAL OF ANTERIOR CERVICAL PLATE,ANTERIOR CERVICAL DECOMPRESSION/DISCECTOMY FUSION TWO  CERVICAL THREE- CERVICAL FOUR, CERVICAL FOUR- CERVICAL FIVE (N/A Spine Cervical)  Patient Location: PACU  Anesthesia Type:General  Level of Consciousness: drowsy and responds to stimulation  Airway & Oxygen Therapy: Patient Spontanous Breathing and Patient connected to nasal cannula oxygen  Post-op Assessment: Report given to RN and Post -op Vital signs reviewed and stable  Post vital signs: Reviewed and stable  Last Vitals:  Vitals Value Taken Time  BP 154/90 10/15/2018  2:55 PM  Temp    Pulse 92 10/15/2018  2:58 PM  Resp 15 10/15/2018  2:58 PM  SpO2 96 % 10/15/2018  2:58 PM  Vitals shown include unvalidated device data.  Last Pain:  Vitals:   10/15/18 0931  TempSrc:   PainSc: 3       Patients Stated Pain Goal: 3 (74/08/14 4818)  Complications: No apparent anesthesia complications

## 2018-10-15 NOTE — Anesthesia Postprocedure Evaluation (Signed)
Anesthesia Post Note  Patient: Kimberly King  Procedure(s) Performed: PARTIAL REMOVAL OF ANTERIOR CERVICAL PLATE,ANTERIOR CERVICAL DECOMPRESSION/DISCECTOMY FUSION TWO  CERVICAL THREE- CERVICAL FOUR, CERVICAL FOUR- CERVICAL FIVE (N/A Spine Cervical)     Patient location during evaluation: PACU Anesthesia Type: General Level of consciousness: awake Pain management: pain level controlled Vital Signs Assessment: post-procedure vital signs reviewed and stable Respiratory status: spontaneous breathing Cardiovascular status: stable Postop Assessment: no apparent nausea or vomiting Anesthetic complications: no    Last Vitals:  Vitals:   10/15/18 1600 10/15/18 1615  BP:  (!) 156/90  Pulse: 100 90  Resp: (!) 21 20  Temp:  36.6 C  SpO2: 96% 97%    Last Pain:  Vitals:   10/15/18 1615  TempSrc: Oral  PainSc:                  Makita Blow

## 2018-10-15 NOTE — Anesthesia Preprocedure Evaluation (Signed)
Anesthesia Evaluation  Patient identified by MRN, date of birth, ID band Patient awake    Reviewed: Allergy & Precautions, NPO status , Patient's Chart, lab work & pertinent test results  Airway Mallampati: II  TM Distance: >3 FB     Dental   Pulmonary former smoker,    breath sounds clear to auscultation       Cardiovascular negative cardio ROS   Rhythm:Regular Rate:Normal     Neuro/Psych  Headaches,    GI/Hepatic GERD  ,  Endo/Other  Hyperthyroidism   Renal/GU      Musculoskeletal  (+) Arthritis ,   Abdominal   Peds  Hematology   Anesthesia Other Findings   Reproductive/Obstetrics                             Anesthesia Physical Anesthesia Plan  ASA: III  Anesthesia Plan: General   Post-op Pain Management:    Induction: Intravenous  PONV Risk Score and Plan: Ondansetron, Dexamethasone and Midazolam  Airway Management Planned: Oral ETT  Additional Equipment:   Intra-op Plan:   Post-operative Plan: Possible Post-op intubation/ventilation  Informed Consent: I have reviewed the patients History and Physical, chart, labs and discussed the procedure including the risks, benefits and alternatives for the proposed anesthesia with the patient or authorized representative who has indicated his/her understanding and acceptance.   Dental advisory given  Plan Discussed with: CRNA and Anesthesiologist  Anesthesia Plan Comments:         Anesthesia Quick Evaluation

## 2018-10-15 NOTE — Progress Notes (Signed)
Vitals:   10/15/18 1540 10/15/18 1555 10/15/18 1600 10/15/18 1615  BP: (!) 149/83 121/78  (!) 156/90  Pulse: 95 93 100 90  Resp: 17 (!) 23 (!) 21 20  Temp:    97.8 F (36.6 C)  TempSrc:    Oral  SpO2: 95% 94% 96% 97%  Weight:      Height:        Patient sitting up in bed, eating.  Comfortable.  Patient has voided, but she has not yet ambulated in the halls.  Incision clean and dry.  Plan: Spoke with the patient and her daughter regarding her decreased bone density.  Encouraged to ambulate in the halls.  Continue to progress through postoperative recovery.  Hosie Spangle, MD 10/15/2018, 6:04 PM

## 2018-10-16 ENCOUNTER — Encounter (HOSPITAL_COMMUNITY): Payer: Self-pay | Admitting: Neurosurgery

## 2018-10-16 DIAGNOSIS — M5011 Cervical disc disorder with radiculopathy,  high cervical region: Secondary | ICD-10-CM | POA: Diagnosis not present

## 2018-10-16 MED ORDER — HYDROCODONE-ACETAMINOPHEN 5-325 MG PO TABS: 1.0000 | ORAL_TABLET | ORAL | 0 refills | Status: DC | PRN

## 2018-10-16 NOTE — Discharge Summary (Signed)
Physician Discharge Summary  Patient ID: Kimberly King MRN: [id number] DOB/AGE: 1951/07/07 68 y.o.  Admit date: 10/15/2018 Discharge date: 10/16/2018  Admission Diagnoses:  C3-4 and C4-5 spondylitic cervical disc herniation, cervical spondylosis, cervical degenerative disease, cervical radiculopathy  Discharge Diagnoses:  C3-4 and C4-5 spondylitic cervical disc herniation, cervical spondylosis, cervical degenerative disease, cervical radiculopathy Active Problems:   HNP (herniated nucleus pulposus), cervical  Discharged Condition: good  Hospital Course: Patient was admitted, underwent a C3-4 and C4-5 ACDF (we did remove the upper portion of her existing C5-7 plate).  She has done well following surgery.  She is up and ambulating actively.  She is being discharged home with instructions regarding wound care and activities.  She is scheduled follow-up with me in about 3 weeks.  Discharge Exam: Blood pressure (!) 141/85, pulse 81, temperature 98 F (36.7 C), temperature source Oral, resp. rate 16, height 5\' 4"  (1.626 m), weight 79.4 kg, SpO2 99 %.  Disposition: Discharge disposition: 01-Home or Self Care       Discharge Instructions    Discharge wound care:   Complete by:  As directed    Leave the wound open to air. Shower daily with the wound uncovered. Water and soapy water should run over the incision area. Do not wash directly on the incision for 2 weeks. Remove the glue after 2 weeks.   Driving Restrictions   Complete by:  As directed    No driving for 2 weeks. May ride in the car locally now. May begin to drive locally in 2 weeks.   Other Restrictions   Complete by:  As directed    Walk gradually increasing distances out in the fresh air at least twice a day. Walking additional 6 times inside the house, gradually increasing distances, daily. No bending, lifting, or twisting. Perform activities between shoulder and waist height (that is at counter height when standing or table  height when sitting).     Allergies as of 10/16/2018   No Known Allergies     Medication List    STOP taking these medications   conjugated estrogens vaginal cream Commonly known as:  PREMARIN     TAKE these medications   acetaminophen 325 MG tablet Commonly known as:  TYLENOL Take 650 mg by mouth every 6 (six) hours as needed for moderate pain or headache.   DENTA 5000 PLUS 1.1 % Crea dental cream Generic drug:  sodium fluoride Place 1 application onto teeth at bedtime. brush teeth for 2 minutes and spit out do not rinse   diclofenac 75 MG EC tablet Commonly known as:  VOLTAREN Take 75 mg by mouth daily.   fluticasone 50 MCG/ACT nasal spray Commonly known as:  FLONASE Place 1 spray into both nostrils daily.   GLUCOSAMINE-CHONDROITIN PO Take 1 tablet by mouth daily.   HYDROcodone-acetaminophen 5-325 MG tablet Commonly known as:  NORCO/VICODIN Take 1-2 tablets by mouth every 4 (four) hours as needed (pain).   ICAPS LUTEIN & ZEAXANTHIN PO Take 1 capsule by mouth daily.   pantoprazole 40 MG tablet Commonly known as:  PROTONIX TAKE 1 TABLET BY MOUTH EVERY DAY   ranitidine 150 MG tablet Commonly known as:  ZANTAC TAKE 1 TABLET (150 MG TOTAL) BY MOUTH AT BEDTIME.   SUMAtriptan 50 MG tablet Commonly known as:  IMITREX Take 50 mg by mouth every 2 (two) hours as needed for migraine.   SYSTANE OP Place 1 drop into both eyes 2 (two) times daily as needed (for dry eyes).  traMADol 50 MG tablet Commonly known as:  ULTRAM Take 1 tablet (50 mg total) by mouth 2 (two) times daily as needed for moderate pain.   TURMERIC CURCUMIN PO Take 1 capsule by mouth daily.   XIIDRA 5 % Soln Generic drug:  Lifitegrast Place 1 drop into both eyes once a week.            Discharge Care Instructions  (From admission, onward)         Start     Ordered   10/16/18 0000  Discharge wound care:    Comments:  Leave the wound open to air. Shower daily with the wound uncovered.  Water and soapy water should run over the incision area. Do not wash directly on the incision for 2 weeks. Remove the glue after 2 weeks.   10/16/18 0854           Signed: Hosie Spangle 10/16/2018, 9:02 AM

## 2018-10-16 NOTE — Discharge Instructions (Signed)
°  Call Your Doctor If Any of These Occur Redness, drainage, or swelling at the wound.  Temperature greater than 101 degrees. Severe pain not relieved by pain medication. Increased difficulty swallowing. Incision starts to come apart. Follow Up Appt Call for problems.  If you have any hardware placed in your spine, you will need an x-ray before your appointment.

## 2018-10-16 NOTE — Progress Notes (Signed)
Patient alert and oriented, mae's well, voiding adequate amount of urine, swallowing without difficulty, no c/o pain at time of discharge. Patient discharged home with family. Script and discharged instructions given to patient. Patient and family stated understanding of instructions given. Patient has an appointment with Dr. Nudelman 

## 2018-10-17 ENCOUNTER — Telehealth: Payer: Self-pay | Admitting: *Deleted

## 2018-10-17 NOTE — Telephone Encounter (Signed)
TC from patient stating that she had cervical spine surgery by Dr. Sherwood Gambler on 10/15/18 and was told she had decreased bone density and wanted Dr. Julien Nordmann to be aware of this.  Dr. Sherwood Gambler noted this in his notes.

## 2018-10-29 NOTE — Progress Notes (Signed)
Office Visit Note  Patient: Kimberly King             Date of Birth: 1951-08-30           MRN: [id number]             PCP: Alycia Rossetti, MD Referring: Alycia Rossetti, MD Visit Date: 11/12/2018 Occupation: [employer] at [employer]  Subjective:  Pain in multiple joints.   History of Present Illness: Kimberly King is a 68 y.o. female seen in consultation per request of Dr.Xu.  According to patient she has had history of arthritis for many years.  She has had cervical spine surgery x3 so far.  Her last surgery of the cervical spine was October 15, 2018 by Dr. Sherwood Gambler which was a cervical fusion.  She is gradually recovering from that.  She is also been diagnosed with osteoarthritis of her high right hip joint about 3 years ago.  She states she has a spur in that area.  She has had injections by Dr. Ernestina Patches in her hip.  She also has osteoarthritis in her knee joints and underwent left total knee replacement by Dr.Xu on March 19, 2018 and she is recovering well from that.  She states she has been having hand pain for the last few months and she has noticed some swelling and prominence of some of her finger joints.  Complains of discomfort in her right CMC joint.  None of the other joints are painful.  Activities of Daily Living:  Patient reports morning stiffness for 5 minutes.   Patient Reports nocturnal pain.  Difficulty dressing/grooming: Denies Difficulty climbing stairs: Denies Difficulty getting out of chair: Reports Difficulty using hands for taps, buttons, cutlery, and/or writing: Denies  Review of Systems  Constitutional: Positive for fatigue. Negative for night sweats, weight gain and weight loss.  HENT: Positive for mouth dryness. Negative for mouth sores, trouble swallowing, trouble swallowing and nose dryness.   Eyes: Positive for dryness. Negative for pain, redness and visual disturbance.  Respiratory: Negative for cough, shortness of breath and difficulty breathing.     Cardiovascular: Negative for chest pain, palpitations, hypertension, irregular heartbeat and swelling in legs/feet.  Gastrointestinal: Negative for blood in stool, constipation and diarrhea.  Endocrine: Negative for increased urination.  Genitourinary: Negative for vaginal dryness.  Musculoskeletal: Positive for arthralgias, joint pain, joint swelling and morning stiffness. Negative for myalgias, muscle weakness, muscle tenderness and myalgias.  Skin: Negative for color change, rash, hair loss, skin tightness, ulcers and sensitivity to sunlight.  Allergic/Immunologic: Negative for susceptible to infections.  Neurological: Negative for dizziness, memory loss, night sweats and weakness.  Hematological: Negative for swollen glands.  Psychiatric/Behavioral: Positive for depressed mood. Negative for sleep disturbance. The patient is nervous/anxious.     PMFS History:  Patient Active Problem List   Diagnosis Date Noted  . DDD (degenerative disc disease), lumbar 11/12/2018  . HNP (herniated nucleus pulposus), cervical 10/15/2018  . Mild hyperlipidemia 05/23/2018  . S/P TKR (total knee replacement), left 03/19/2018  . Obesity (BMI 30-39.9) 12/31/2017  . Primary localized osteoarthritis of knees, bilateral 12/05/2017  . Primary localized osteoarthritis of right hip 12/05/2017  . Migraines 07/01/2017  . Chronic neck and back pain 07/01/2017  . Thyroid nodule 06/30/2017  . GERD (gastroesophageal reflux disease) 06/30/2017  . Vaginal atrophy 06/30/2017  . Subclinical hyperthyroidism 12/06/2016  . CLL (chronic lymphocytic leukemia) ([place]) 12/17/2013  . COLONIC POLYPS, ADENOMATOUS 10/26/2007    Past Medical History:  Diagnosis Date  .  Allergy   . Anxiety   . Arthritis    knees hips neck  . Cancer [facility])    leukemia dx 06/2013 - no current treatment - being monitored  . Cataract    bilateral - being monitored, no treatment currently  . CLL (chronic lymphocytic leukemia) ([place])   . Depression    . GERD (gastroesophageal reflux disease)   . Leukocytosis    and low blood sodium   . Migraines   . Thyroid nodule    JUST WATCHING    Family History  Problem Relation Age of Onset  . Colon cancer Paternal Grandmother 27  . Esophageal cancer Paternal Uncle   . Arthritis Mother   . Heart disease Father   . Lung cancer Father   . Lung cancer Maternal Grandfather   . Breast cancer Maternal Grandmother   . Cirrhosis Brother   . Rectal cancer Neg Hx   . Stomach cancer Neg Hx    Past Surgical History:  Procedure Laterality Date  . ANTERIOR CERVICAL DECOMP/DISCECTOMY FUSION N/A 10/15/2018   Procedure: PARTIAL REMOVAL OF ANTERIOR CERVICAL PLATE,ANTERIOR CERVICAL DECOMPRESSION/DISCECTOMY FUSION TWO  CERVICAL THREE- CERVICAL FOUR, CERVICAL FOUR- CERVICAL FIVE;  Surgeon: Jovita Gamma, MD;  Location: [place];  Service: Neurosurgery;  Laterality: N/A;  PARTIAL REMOVAL OF ANTERIOR CERVICAL PLATE,ANTERIOR CERVICAL DECOMPRESSION/DISCECTOMY FUSION TWO  CERVICAL THREE- CERVICAL FOUR, CERVICAL   . APPENDECTOMY    . BLADDER SURGERY     tack  . CERVICAL FUSION     x2  -  C5-C6  . COLONOSCOPY  2014   pyrtle - hx polyps  . DILATION AND CURETTAGE OF UTERUS     x 2  . JOINT REPLACEMENT    . knee     arthroscopy    left   07/2017  . KNEE SURGERY Left 2018  . TONSILLECTOMY    . TOTAL KNEE ARTHROPLASTY Left 03/19/2018   Procedure: LEFT TOTAL KNEE ARTHROPLASTY;  Surgeon: Leandrew Koyanagi, MD;  Location: [place];  Service: Orthopedics;  Laterality: Left;   Social History   Social History Narrative  . Not on file   Immunization History  Administered Date(s) Administered  . Influenza,inj,Quad PF,6+ Mos 06/19/2013, 07/12/2014, 08/09/2015, 06/23/2016, 06/10/2017, 07/04/2018  . Pneumococcal Conjugate-13 06/10/2017  . Pneumococcal Polysaccharide-23 06/19/2013  . Td 12/31/2007     Objective: Vital Signs: BP (!) 141/86 (BP Location: Right Arm, Patient Position: Sitting, Cuff Size: Normal)   Pulse 91    Resp 13   Ht 5\' 4"  (1.626 m)   Wt 182 lb (82.6 kg)   BMI 31.24 kg/m    Physical Exam Vitals signs and nursing note reviewed.  Constitutional:      Appearance: She is well-developed.  HENT:     Head: Normocephalic and atraumatic.  Eyes:     Conjunctiva/sclera: Conjunctivae normal.  Neck:     Musculoskeletal: Normal range of motion.  Cardiovascular:     Rate and Rhythm: Normal rate and regular rhythm.     Heart sounds: Normal heart sounds.  Pulmonary:     Effort: Pulmonary effort is normal.     Breath sounds: Normal breath sounds.  Abdominal:     General: Bowel sounds are normal.     Palpations: Abdomen is soft.  Lymphadenopathy:     Cervical: No cervical adenopathy.  Skin:    General: Skin is warm and dry.     Capillary Refill: Capillary refill takes less than 2 seconds.  Neurological:     Mental Status:  She is alert and oriented to person, place, and time.  Psychiatric:        Behavior: Behavior normal.      Musculoskeletal Exam: C-spine limited range of motion she is in a brace currently.  Shoulder joints elbow joints wrist joint MCPs with good range of motion with no synovitis or synovial thickening.  She has PIP DIP thickening bilaterally and tenderness over right CMC joint.  No synovitis was noted.  She has limited range of motion of her right hip joint.  Right knee joint was in good range of motion.  Left knee joint is replaced and had some warmth.  She has no ankle joint MTP PIP changes.  CDAI Exam: CDAI Score: Not documented Patient Global Assessment: Not documented; Provider Global Assessment: Not documented Swollen: Not documented; Tender: Not documented Joint Exam   Not documented   There is currently no information documented on the homunculus. Go to the Rheumatology activity and complete the homunculus joint exam.  Investigation: No additional findings. Component     Latest Ref Rng & Units 08/06/2018  T4,Free(Direct)     0.8 - 1.8 ng/dL 1.1   Triiodothyronine,Free,Serum     2.3 - 4.2 pg/mL 2.4  TSH     0.40 - 4.50 mIU/L 0.64   Imaging: Dg Cervical Spine 2-3 Views  Result Date: 10/15/2018 CLINICAL DATA:  C3-4 and C4-5 anterior cervical decompression and plating. EXAM: CERVICAL SPINE - 2-3 VIEW COMPARISON:  07/30/2018 FINDINGS: Cross-table portable view of the cervical spine obtained at 1405 hours is labeled ?#1?Marland Kitchen This shows interval revision of the patient's previously existing C5-7 anterior plate now with the cranial most screw in the C6 vertebral body. There is been interval discectomy and interbody graft placed at C3-4 and C4-5. New anterior plate bridges from W5-I6 with screws in the C3 and C5 vertebral bodies. C3 screws are at and potentially involve the inferior endplate. IMPRESSION: New anterior cervical discectomy and interbody fusion at C3-4 and C4-5 with revision of previous C5-7 anterior fusion. Electronically Signed   By: Misty Stanley M.D.   On: 10/15/2018 14:46   Xr Hand 2 View Left  Result Date: 11/12/2018 PIP, DIP, CMC narrowing was noted.  No MCP, intercarpal, radiocarpal joint space narrowing was noted.  No erosive changes were noted. Impression: These findings are consistent with osteoarthritis of the hand.  Xr Hand 2 View Right  Result Date: 11/12/2018 PIP, DIP, CMC narrowing was noted.  No MCP, intercarpal, radiocarpal joint space narrowing was noted.  No erosive changes were noted. Impression: These findings are consistent with osteoarthritis of the hand.   Recent Labs: Lab Results  Component Value Date   WBC 15.4 (H) 10/05/2018   HGB 11.4 (L) 10/05/2018   PLT 372 10/05/2018   NA 139 10/05/2018   K 4.2 10/05/2018   CL 104 10/05/2018   CO2 26 10/05/2018   GLUCOSE 94 10/05/2018   BUN 16 10/05/2018   CREATININE 0.67 10/05/2018   BILITOT 0.3 05/07/2018   ALKPHOS 94 03/13/2018   AST 13 05/07/2018   ALT 12 05/07/2018   PROT 6.8 05/07/2018   ALBUMIN 4.3 03/13/2018   CALCIUM 9.6 10/05/2018   GFRAA >60  10/05/2018   I reviewed x-rays of her right knee joint which showed only mild osteoarthritic changes.  Her right hip joint x-ray showed moderate osteoarthritis.  Lumbar spine x-ray showed multilevel spondylosis and facet joint arthropathy.  C-spine x-ray showed postsurgical changes. Speciality Comments: No specialty comments available.  Procedures:  No  procedures performed Allergies: Patient has no known allergies.   Assessment / Plan:     Visit Diagnoses: Bilateral hand pain - ANA negative 05/07/18 -patient has no synovitis on examination.  Clinical findings are consistent with osteoarthritis of bilateral hands.  She has DIP PIP and CMC thickening and discomfort.  No synovitis was noted.  X-rays obtained today also are consistent with osteoarthritis.  Joint protection muscle strengthening was discussed.  Handout on exercises was given.  We also went over natural anti-inflammatories and prescription for Voltaren gel was given.  Side effects were discussed.  Use of oral diclofenac was discouraged.  Plan: XR Hand 2 View Right, XR Hand 2 View Left  S/P TKR (total knee replacement), left - Dr. Erlinda Hong 03/19/18.  Doing well  Primary localized osteoarthritis of right hip-she has moderate osteoarthritis in her right hip.  She has limited range of motion.  X-rays were reviewed and discussed.  DDD (degenerative disc disease), cervical - s/p fusion 10/15/18 by Dr. Sherwood Gambler.  She is gradually recovering from it.  DDD (degenerative disc disease), lumbar-she also have disc disease and facet joint arthropathy in her lower back.  CLL (chronic lymphocytic leukemia) (HCC)-patient states this is stable and she did not require any treatment.  Class I obesity with BMI 31.24-weight loss diet and exercise was discussed at length.  This should be helpful for her lower back pain and her hip pain.  And also should help osteoarthritis in her knee joints.  Other medical problems are listed as follows:  Thyroid  nodule  History of gastroesophageal reflux (GERD)  Hx of migraines  Mild hyperlipidemia  Anxiety and depression   Orders: Orders Placed This Encounter  Procedures  . XR Hand 2 View Right  . XR Hand 2 View Left   Meds ordered this encounter  Medications  . diclofenac sodium (VOLTAREN) 1 % GEL    Sig: Apply 2 to 4 grams topically to affected joint up to 4 times a day as needed.    Dispense:  4 Tube    Refill:  3    Face-to-face time spent with patient was 45 minutes. Greater than 50% of time was spent in counseling and coordination of care.  Follow-Up Instructions: Return if symptoms worsen or fail to improve, for Osteoarthritis.   Bo Merino, MD  Note - This record has been created using Editor, commissioning.  Chart creation errors have been sought, but may not always  have been located. Such creation errors do not reflect on  the standard of medical care.

## 2018-11-05 ENCOUNTER — Other Ambulatory Visit: Payer: Self-pay | Admitting: Family Medicine

## 2018-11-05 DIAGNOSIS — C9110 Chronic lymphocytic leukemia of B-cell type not having achieved remission: Secondary | ICD-10-CM

## 2018-11-06 ENCOUNTER — Other Ambulatory Visit: Payer: Self-pay | Admitting: Family Medicine

## 2018-11-06 DIAGNOSIS — C9110 Chronic lymphocytic leukemia of B-cell type not having achieved remission: Secondary | ICD-10-CM

## 2018-11-08 DIAGNOSIS — M542 Cervicalgia: Secondary | ICD-10-CM | POA: Diagnosis not present

## 2018-11-08 DIAGNOSIS — M47812 Spondylosis without myelopathy or radiculopathy, cervical region: Secondary | ICD-10-CM | POA: Diagnosis not present

## 2018-11-08 DIAGNOSIS — M5030 Other cervical disc degeneration, unspecified cervical region: Secondary | ICD-10-CM | POA: Diagnosis not present

## 2018-11-08 DIAGNOSIS — Z981 Arthrodesis status: Secondary | ICD-10-CM | POA: Diagnosis not present

## 2018-11-12 ENCOUNTER — Encounter: Payer: Self-pay | Admitting: Rheumatology

## 2018-11-12 ENCOUNTER — Ambulatory Visit (INDEPENDENT_AMBULATORY_CARE_PROVIDER_SITE_OTHER): Payer: Self-pay

## 2018-11-12 ENCOUNTER — Ambulatory Visit (INDEPENDENT_AMBULATORY_CARE_PROVIDER_SITE_OTHER): Payer: Medicare Other

## 2018-11-12 ENCOUNTER — Ambulatory Visit (INDEPENDENT_AMBULATORY_CARE_PROVIDER_SITE_OTHER): Payer: Medicare Other | Admitting: Rheumatology

## 2018-11-12 VITALS — BP 141/86 | HR 91 | Resp 13 | Ht 64.0 in | Wt 182.0 lb

## 2018-11-12 DIAGNOSIS — M5030 Other cervical disc degeneration, unspecified cervical region: Secondary | ICD-10-CM | POA: Diagnosis not present

## 2018-11-12 DIAGNOSIS — M79641 Pain in right hand: Secondary | ICD-10-CM

## 2018-11-12 DIAGNOSIS — F419 Anxiety disorder, unspecified: Secondary | ICD-10-CM | POA: Diagnosis not present

## 2018-11-12 DIAGNOSIS — Z96652 Presence of left artificial knee joint: Secondary | ICD-10-CM

## 2018-11-12 DIAGNOSIS — M1611 Unilateral primary osteoarthritis, right hip: Secondary | ICD-10-CM

## 2018-11-12 DIAGNOSIS — F32A Depression, unspecified: Secondary | ICD-10-CM

## 2018-11-12 DIAGNOSIS — Z6831 Body mass index (BMI) 31.0-31.9, adult: Secondary | ICD-10-CM

## 2018-11-12 DIAGNOSIS — M5136 Other intervertebral disc degeneration, lumbar region: Secondary | ICD-10-CM | POA: Diagnosis not present

## 2018-11-12 DIAGNOSIS — E6609 Other obesity due to excess calories: Secondary | ICD-10-CM | POA: Diagnosis not present

## 2018-11-12 DIAGNOSIS — C9110 Chronic lymphocytic leukemia of B-cell type not having achieved remission: Secondary | ICD-10-CM

## 2018-11-12 DIAGNOSIS — Z8669 Personal history of other diseases of the nervous system and sense organs: Secondary | ICD-10-CM | POA: Diagnosis not present

## 2018-11-12 DIAGNOSIS — M51369 Other intervertebral disc degeneration, lumbar region without mention of lumbar back pain or lower extremity pain: Secondary | ICD-10-CM

## 2018-11-12 DIAGNOSIS — Z8719 Personal history of other diseases of the digestive system: Secondary | ICD-10-CM | POA: Diagnosis not present

## 2018-11-12 DIAGNOSIS — E785 Hyperlipidemia, unspecified: Secondary | ICD-10-CM | POA: Diagnosis not present

## 2018-11-12 DIAGNOSIS — M79642 Pain in left hand: Secondary | ICD-10-CM

## 2018-11-12 DIAGNOSIS — E66811 Obesity, class 1: Secondary | ICD-10-CM

## 2018-11-12 DIAGNOSIS — E041 Nontoxic single thyroid nodule: Secondary | ICD-10-CM

## 2018-11-12 DIAGNOSIS — F329 Major depressive disorder, single episode, unspecified: Secondary | ICD-10-CM

## 2018-11-12 MED ORDER — DICLOFENAC SODIUM 1 % TD GEL: TRANSDERMAL | 3 refills | Status: DC

## 2018-11-12 NOTE — Patient Instructions (Signed)

## 2018-11-12 NOTE — Progress Notes (Signed)
Pharmacy Note  Patient presents to [facility] for initial consultation with Dr. Estanislado Pandy.  Patient seen by pharmacist for counseling on Voltaren gel and natural anti-inflammatories for osteoarthritis.   Has patient tried NSAID's previously?  Yes, Diclofenac  Patient on the purpose, proper use, and adverse effects of Voltaren gel including headache, increased blood pressure, and risk of GI bleed.  Instructed patient to avoid applying to open skin wound, or on areas of infection, rash, burn, or peeling skin.  Advised  patient wait at least 10 minutes before dressing or wearing gloves and wait at least 1 hour before you bathe or shower.  Counseled patient to wash hands after application and avoid contact with face/eyes.  Advised patient to apply with q-tip if applying to hands to minimize absorption on palms.  Patient given GoodRx coupon to help with cost as it is not routinely covered by insurance.    She is currently taking diclofenac 75 mg daily.  Instructed patient that she can not use diclofenac tablets in combination with Voltaren gel.  She will stop tablets and try gel tomorrow.  If she does not find gel effective she may resume tablets.  Counseled on the purpose, proper use, and adverse effects of natural anti-inflammatories including upset stomach and increased bleeding risk.  Encouraged patient to add one medication at a time and to include on medication list to monitor for adverse effects and drug interactions.  Given educational handout with recommended doses.  All questions encouraged and answered.  Instructed patient to call with any further questions or concerns.  Mariella Saa, PharmD, [facility] Rheumatology Clinical Pharmacist  11/12/2018 8:49 AM

## 2018-11-14 ENCOUNTER — Telehealth (INDEPENDENT_AMBULATORY_CARE_PROVIDER_SITE_OTHER): Payer: Self-pay | Admitting: Physical Medicine and Rehabilitation

## 2018-11-14 ENCOUNTER — Other Ambulatory Visit: Payer: Self-pay

## 2018-11-14 ENCOUNTER — Encounter: Payer: Self-pay | Admitting: Family Medicine

## 2018-11-14 ENCOUNTER — Ambulatory Visit (INDEPENDENT_AMBULATORY_CARE_PROVIDER_SITE_OTHER): Payer: Medicare Other | Admitting: Family Medicine

## 2018-11-14 VITALS — BP 138/68 | HR 82 | Temp 98.1°F | Resp 14 | Ht 64.0 in | Wt 182.0 lb

## 2018-11-14 DIAGNOSIS — E559 Vitamin D deficiency, unspecified: Secondary | ICD-10-CM | POA: Diagnosis not present

## 2018-11-14 DIAGNOSIS — C9110 Chronic lymphocytic leukemia of B-cell type not having achieved remission: Secondary | ICD-10-CM | POA: Diagnosis not present

## 2018-11-14 DIAGNOSIS — M8589 Other specified disorders of bone density and structure, multiple sites: Secondary | ICD-10-CM

## 2018-11-14 MED ORDER — TRAMADOL HCL 50 MG PO TABS: ORAL_TABLET | ORAL | 0 refills | Status: DC

## 2018-11-14 NOTE — Patient Instructions (Addendum)
Bone density- call and schedule  F/U 4 months

## 2018-11-14 NOTE — Telephone Encounter (Signed)
Yes ok 

## 2018-11-14 NOTE — Telephone Encounter (Signed)
Scheduled for 11/16/18 at [place].

## 2018-11-14 NOTE — Progress Notes (Signed)
   Subjective:    Patient ID: Kimberly King, female    DOB: September 30, 1951, 68 y.o.   MRN: [id number]  Patient presents for Bone Density   Dr. Yancey Flemings - told her she needs bone density     Ostepenia noted during her C spine fusion. She is taking vitamin D, had normal bone density a few years ago.   Now taking her ultram for pain and tylenol, needs to be refilled  CLL followed by oncology, doing well  Review Of Systems:  GEN- denies fatigue, fever, weight loss,weakness, recent illness HEENT- denies eye drainage, change in vision, nasal discharge, CVS- denies chest pain, palpitations RESP- denies SOB, cough, wheeze ABD- denies N/V, change in stools, abd pain GU- denies dysuria, hematuria, dribbling, incontinence MSK- denies joint pain, muscle aches, injury Neuro- denies headache, dizziness, syncope, seizure activity       Objective:    BP 138/68   Pulse 82   Temp 98.1 F (36.7 C) (Oral)   Resp 14   Ht 5\' 4"  (1.626 m)   Wt 182 lb (82.6 kg)   SpO2 96%   BMI 31.24 kg/m  GEN- NAD, alert and oriented x3 HEENT- PERRL, EOMI, non injected sclera, pink conjunctiva, MMM, oropharynx clear Neck- in neck brace CVS- RRR, no murmur RESP-CTAB EXT- No edema Pulses- Radial  2+        Assessment & Plan:      Problem List Items Addressed This Visit      Unprioritized   CLL (chronic lymphocytic leukemia) ([place])    Followed by oncology, has had recent labs      Relevant Medications   traMADol (ULTRAM) 50 MG tablet    Other Visit Diagnoses    Osteopenia of multiple sites    -  Primary   obtain bone density and check vitamin D level, treatment pending results    Relevant Orders   DG Bone Density   Vitamin D deficiency       Relevant Orders   Vitamin D, 25-hydroxy (Completed)      Note: This dictation was prepared with Dragon dictation along with smaller phrase technology. Any transcriptional errors that result from this process are unintentional.

## 2018-11-15 ENCOUNTER — Encounter: Payer: Self-pay | Admitting: Family Medicine

## 2018-11-15 LAB — VITAMIN D 25 HYDROXY (VIT D DEFICIENCY, FRACTURES): Vit D, 25-Hydroxy: 47 ng/mL (ref 30–100)

## 2018-11-15 NOTE — Assessment & Plan Note (Signed)
Followed by oncology, has had recent labs

## 2018-11-16 ENCOUNTER — Ambulatory Visit (INDEPENDENT_AMBULATORY_CARE_PROVIDER_SITE_OTHER): Payer: Self-pay

## 2018-11-16 ENCOUNTER — Ambulatory Visit (INDEPENDENT_AMBULATORY_CARE_PROVIDER_SITE_OTHER): Payer: Medicare Other | Admitting: Physical Medicine and Rehabilitation

## 2018-11-16 ENCOUNTER — Encounter (INDEPENDENT_AMBULATORY_CARE_PROVIDER_SITE_OTHER): Payer: Self-pay | Admitting: Physical Medicine and Rehabilitation

## 2018-11-16 DIAGNOSIS — M25551 Pain in right hip: Secondary | ICD-10-CM

## 2018-11-16 NOTE — Progress Notes (Signed)
 .  Numeric Pain Rating Scale and Functional Assessment Average Pain 2   In the last MONTH (on 0-10 scale) has pain interfered with the following?  1. General activity like being  able to carry out your everyday physical activities such as walking, climbing stairs, carrying groceries, or moving a chair?  Rating(1)   -Dye Allergies.

## 2018-11-16 NOTE — Progress Notes (Signed)
   Kimberly King - 68 y.o. female MRN [id number]  Date of birth: Feb 02, 1951  Office Visit Note: Visit Date: 11/16/2018 PCP: Alycia Rossetti, MD Referred by: Alycia Rossetti, MD  Subjective: Chief Complaint  Patient presents with  . Right Hip - Pain   HPI:  Kimberly King is a 68 y.o. female who comes in today For planned repeat right hip intra-articular injection of fluoroscopic imaging.  She is followed by Dr. Eduard Roux from orthopedic standpoint.  Prior injection in July gave her quite a bit of relief up until just a few weeks ago with no new trauma or other red flag complaints.  Since we last seen her she has had hardware removed from an anterior cervical discectomy.  She sees Dr. Sherwood Gambler for this.  ROS Otherwise per HPI.  Assessment & Plan: Visit Diagnoses:  1. Pain in right hip     Plan: No additional findings.   Meds & Orders: No orders of the defined types were placed in this encounter.   Orders Placed This Encounter  Procedures  . Large Joint Inj: R hip joint  . XR C-ARM NO REPORT    Follow-up: No follow-ups on file.   Procedures: Large Joint Inj: R hip joint on 11/16/2018 8:51 AM Indications: pain and diagnostic evaluation Details: 22 G needle, anterior approach  Arthrogram: Yes  Medications: 80 mg triamcinolone acetonide 40 MG/ML; 3 mL bupivacaine 0.5 % Outcome: tolerated well, no immediate complications  Arthrogram demonstrated excellent flow of contrast throughout the joint surface without extravasation or obvious defect.  The patient had relief of symptoms during the anesthetic phase of the injection.  Procedure, treatment alternatives, risks and benefits explained, specific risks discussed. Consent was given by the patient. Immediately prior to procedure a time out was called to verify the correct patient, procedure, equipment, support staff and site/side marked as required. Patient was prepped and draped in the usual sterile fashion.      No notes  on file   Clinical History: No specialty comments available.     Objective:  VS:  HT:    WT:   BMI:     BP:   HR: bpm  TEMP: ( )  RESP:  Physical Exam  Ortho Exam Imaging: No results found.

## 2018-11-20 ENCOUNTER — Encounter: Payer: Self-pay | Admitting: *Deleted

## 2018-11-23 ENCOUNTER — Ambulatory Visit
Admission: RE | Admit: 2018-11-23 | Discharge: 2018-11-23 | Disposition: A | Discharge status: Home / Self Care | Payer: Medicare Other | Source: Ambulatory Visit | Attending: Family Medicine | Admitting: Family Medicine

## 2018-11-23 DIAGNOSIS — M8589 Other specified disorders of bone density and structure, multiple sites: Secondary | ICD-10-CM

## 2018-11-23 DIAGNOSIS — M85852 Other specified disorders of bone density and structure, left thigh: Secondary | ICD-10-CM | POA: Diagnosis not present

## 2018-11-23 DIAGNOSIS — Z780 Asymptomatic menopausal state: Secondary | ICD-10-CM | POA: Diagnosis not present

## 2018-11-29 ENCOUNTER — Other Ambulatory Visit: Payer: Self-pay | Admitting: *Deleted

## 2018-11-29 MED ORDER — ALENDRONATE SODIUM 70 MG PO TABS: 70.0000 mg | ORAL_TABLET | ORAL | 11 refills | Status: DC

## 2018-12-03 MED ORDER — TRIAMCINOLONE ACETONIDE 40 MG/ML IJ SUSP
80.0000 mg | INTRAMUSCULAR | Status: AC | PRN
  Administered 2018-11-16: 80 mg via INTRA_ARTICULAR

## 2018-12-03 MED ORDER — BUPIVACAINE HCL 0.5 % IJ SOLN
3.0000 mL | INTRAMUSCULAR | Status: AC | PRN
  Administered 2018-11-16: 3 mL via INTRA_ARTICULAR

## 2018-12-10 ENCOUNTER — Ambulatory Visit: Payer: Medicare Other | Admitting: Rheumatology

## 2018-12-12 DIAGNOSIS — M9902 Segmental and somatic dysfunction of thoracic region: Secondary | ICD-10-CM | POA: Diagnosis not present

## 2018-12-12 DIAGNOSIS — M47816 Spondylosis without myelopathy or radiculopathy, lumbar region: Secondary | ICD-10-CM | POA: Diagnosis not present

## 2018-12-12 DIAGNOSIS — M546 Pain in thoracic spine: Secondary | ICD-10-CM | POA: Diagnosis not present

## 2018-12-12 DIAGNOSIS — M9903 Segmental and somatic dysfunction of lumbar region: Secondary | ICD-10-CM | POA: Diagnosis not present

## 2018-12-12 DIAGNOSIS — M9901 Segmental and somatic dysfunction of cervical region: Secondary | ICD-10-CM | POA: Diagnosis not present

## 2018-12-12 DIAGNOSIS — M47812 Spondylosis without myelopathy or radiculopathy, cervical region: Secondary | ICD-10-CM | POA: Diagnosis not present

## 2018-12-24 ENCOUNTER — Encounter: Payer: Self-pay | Admitting: Internal Medicine

## 2018-12-24 ENCOUNTER — Inpatient Hospital Stay (HOSPITAL_BASED_OUTPATIENT_CLINIC_OR_DEPARTMENT_OTHER): Payer: Medicare Other | Admitting: Internal Medicine

## 2018-12-24 ENCOUNTER — Telehealth: Payer: Self-pay | Admitting: Internal Medicine

## 2018-12-24 ENCOUNTER — Other Ambulatory Visit: Payer: Self-pay

## 2018-12-24 ENCOUNTER — Inpatient Hospital Stay: Payer: Medicare Other | Attending: Internal Medicine

## 2018-12-24 VITALS — BP 136/81 | HR 89 | Temp 98.7°F | Resp 20 | Ht 64.0 in | Wt 185.4 lb

## 2018-12-24 DIAGNOSIS — F329 Major depressive disorder, single episode, unspecified: Secondary | ICD-10-CM

## 2018-12-24 DIAGNOSIS — Z791 Long term (current) use of non-steroidal anti-inflammatories (NSAID): Secondary | ICD-10-CM | POA: Diagnosis not present

## 2018-12-24 DIAGNOSIS — Z79899 Other long term (current) drug therapy: Secondary | ICD-10-CM | POA: Diagnosis not present

## 2018-12-24 DIAGNOSIS — F419 Anxiety disorder, unspecified: Secondary | ICD-10-CM

## 2018-12-24 DIAGNOSIS — C9110 Chronic lymphocytic leukemia of B-cell type not having achieved remission: Secondary | ICD-10-CM

## 2018-12-24 LAB — CBC WITH DIFFERENTIAL (CANCER CENTER ONLY)
Abs Immature Granulocytes: 0.07 10*3/uL (ref 0.00–0.07)
BASOS ABS: 0.1 10*3/uL (ref 0.0–0.1)
Basophils Relative: 1 %
EOS PCT: 1 %
Eosinophils Absolute: 0.2 10*3/uL (ref 0.0–0.5)
HCT: 36.4 % (ref 36.0–46.0)
Hemoglobin: 11 g/dL — ABNORMAL LOW (ref 12.0–15.0)
Immature Granulocytes: 0 %
Lymphocytes Relative: 48 %
Lymphs Abs: 8.4 10*3/uL — ABNORMAL HIGH (ref 0.7–4.0)
MCH: 26.1 pg (ref 26.0–34.0)
MCHC: 30.2 g/dL (ref 30.0–36.0)
MCV: 86.5 fL (ref 80.0–100.0)
MONO ABS: 0.9 10*3/uL (ref 0.1–1.0)
Monocytes Relative: 5 %
NRBC: 0 % (ref 0.0–0.2)
Neutro Abs: 8 10*3/uL — ABNORMAL HIGH (ref 1.7–7.7)
Neutrophils Relative %: 45 %
Platelet Count: 467 10*3/uL — ABNORMAL HIGH (ref 150–400)
RBC: 4.21 MIL/uL (ref 3.87–5.11)
RDW: 15.1 % (ref 11.5–15.5)
WBC: 17.6 10*3/uL — AB (ref 4.0–10.5)

## 2018-12-24 LAB — LACTATE DEHYDROGENASE: LDH: 188 U/L (ref 98–192)

## 2018-12-24 NOTE — Telephone Encounter (Signed)
Scheduled appt per 3/16 los.  Patient declined calendar and avs.

## 2018-12-24 NOTE — Progress Notes (Signed)
[facility] [phone]   [phone]  OFFICE PROGRESS NOTE  Alycia Rossetti, MD [id number] [place] [address]  DIAGNOSIS: Stage 0 chronic lymphocytic leukemia  PRIOR THERAPY: None  CURRENT THERAPY: Observation.  INTERVAL HISTORY: Kimberly King 68 y.o. female returns to the clinic today for 6 months follow-up visit.  The patient is feeling fine today with no concerning complaints except for fatigue.  She denied having any chest pain, shortness of breath, cough or hemoptysis.  She denied having any fever or chills.  She has no weight loss or night sweats.  She has no headache or visual changes.  He has no nausea, vomiting, diarrhea or constipation.  The patient is here today for evaluation with repeat blood work.  MEDICAL HISTORY: Past Medical History:  Diagnosis Date   Allergy    Anxiety    Arthritis    knees hips neck   Cancer ([facility])    leukemia dx 06/2013 - no current treatment - being monitored   Cataract    bilateral - being monitored, no treatment currently   CLL (chronic lymphocytic leukemia) (HCC)    Depression    GERD (gastroesophageal reflux disease)    Leukocytosis    and low blood sodium    Migraines    Thyroid nodule    JUST WATCHING    ALLERGIES:  has No Known Allergies.  MEDICATIONS:  Current Outpatient Medications  Medication Sig Dispense Refill   acetaminophen (TYLENOL) 325 MG tablet Take 650 mg by mouth every 6 (six) hours as needed for moderate pain or headache.     alendronate (FOSAMAX) 70 MG tablet Take 1 tablet (70 mg total) by mouth every 7 (seven) days. Take with a full glass of water on an empty stomach. 4 tablet 11   Calcium Carb-Cholecalciferol (CALCIUM 600 + D PO) Take by mouth daily.     Cholecalciferol (VITAMIN D3) 125 MCG (5000 UT) CAPS Take by mouth daily.     DENTA 5000 PLUS 1.1 % CREA dental cream Place 1 application onto teeth at bedtime. brush teeth for 2 minutes and  spit out do not rinse  2   diclofenac (VOLTAREN) 75 MG EC tablet Take 75 mg by mouth daily.  3   diclofenac sodium (VOLTAREN) 1 % GEL Apply 2 to 4 grams topically to affected joint up to 4 times a day as needed. 4 Tube 3   fluticasone (FLONASE) 50 MCG/ACT nasal spray Place 1 spray into both nostrils daily.      Ginger Root POWD by Does not apply route.     GLUCOSAMINE-CHONDROITIN PO Take 1 tablet by mouth daily.      Lifitegrast (XIIDRA) 5 % SOLN Place 1 drop into both eyes once a week.      pantoprazole (PROTONIX) 40 MG tablet TAKE 1 TABLET BY MOUTH EVERY DAY (Patient taking differently: Take 40 mg by mouth daily. ) 90 tablet 3   Polyethyl Glycol-Propyl Glycol (SYSTANE OP) Place 1 drop into both eyes 2 (two) times daily as needed (for dry eyes).      ranitidine (ZANTAC) 150 MG tablet TAKE 1 TABLET (150 MG TOTAL) BY MOUTH AT BEDTIME. 90 tablet 2   Specialty Vitamins Products (ICAPS LUTEIN & ZEAXANTHIN PO) Take 1 capsule by mouth daily.     SUMAtriptan (IMITREX) 50 MG tablet Take 50 mg by mouth every 2 (two) hours as needed for migraine.      traMADol (  ULTRAM) 50 MG tablet TAKE 1 TABLET BY MOUTH 2 TIMES DAILY AS NEEDED FOR MODERATE PAIN. 60 tablet 0   TURMERIC CURCUMIN PO Take 1 capsule by mouth daily.     No current facility-administered medications for this visit.     SURGICAL HISTORY:  Past Surgical History:  Procedure Laterality Date   ANTERIOR CERVICAL DECOMP/DISCECTOMY FUSION N/A 10/15/2018   Procedure: PARTIAL REMOVAL OF ANTERIOR CERVICAL PLATE,ANTERIOR CERVICAL DECOMPRESSION/DISCECTOMY FUSION TWO  CERVICAL THREE- CERVICAL FOUR, CERVICAL FOUR- CERVICAL FIVE;  Surgeon: Jovita Gamma, MD;  Location: [facility];  Service: Neurosurgery;  Laterality: N/A;  PARTIAL REMOVAL OF ANTERIOR CERVICAL PLATE,ANTERIOR CERVICAL DECOMPRESSION/DISCECTOMY FUSION TWO  CERVICAL THREE- CERVICAL FOUR, CERVICAL    APPENDECTOMY     BLADDER SURGERY     tack   CERVICAL FUSION     x2  -  C5-C6    COLONOSCOPY  2014   pyrtle - hx polyps   DILATION AND CURETTAGE OF UTERUS     x 2   JOINT REPLACEMENT     knee     arthroscopy    left   07/2017   KNEE SURGERY Left 2018   TONSILLECTOMY     TOTAL KNEE ARTHROPLASTY Left 03/19/2018   Procedure: LEFT TOTAL KNEE ARTHROPLASTY;  Surgeon: Leandrew Koyanagi, MD;  Location: [facility];  Service: Orthopedics;  Laterality: Left;    REVIEW OF SYSTEMS:  A comprehensive review of systems was negative except for: Constitutional: positive for fatigue   PHYSICAL EXAMINATION: General appearance: alert, cooperative and no distress Head: Normocephalic, without obvious abnormality, atraumatic Neck: no adenopathy, no JVD, supple, symmetrical, trachea midline and thyroid not enlarged, symmetric, no tenderness/mass/nodules Lymph nodes: Cervical, supraclavicular, and axillary nodes normal. Resp: clear to auscultation bilaterally Back: symmetric, no curvature. ROM normal. No CVA tenderness. Cardio: regular rate and rhythm, S1, S2 normal, no murmur, click, rub or gallop GI: soft, non-tender; bowel sounds normal; no masses,  no organomegaly Extremities: extremities normal, atraumatic, no cyanosis or edema  ECOG PERFORMANCE STATUS: 0 - Asymptomatic  Blood pressure 136/81, pulse 89, temperature 98.7 F (37.1 C), temperature source Oral, resp. rate 20, height 5\' 4"  (1.626 m), weight 185 lb 6.4 oz (84.1 kg), SpO2 97 %.  LABORATORY DATA: Lab Results  Component Value Date   WBC 17.6 (H) 12/24/2018   HGB 11.0 (L) 12/24/2018   HCT 36.4 12/24/2018   MCV 86.5 12/24/2018   PLT 467 (H) 12/24/2018      Chemistry      Component Value Date/Time   NA 139 10/05/2018 0833   NA 139 06/27/2017 1324   K 4.2 10/05/2018 0833   K 3.5 06/27/2017 1324   CL 104 10/05/2018 0833   CO2 26 10/05/2018 0833   CO2 26 06/27/2017 1324   BUN 16 10/05/2018 0833   BUN 22.4 06/27/2017 1324   CREATININE 0.67 10/05/2018 0833   CREATININE 0.91 05/07/2018 1255   CREATININE 0.8 06/27/2017  1324      Component Value Date/Time   CALCIUM 9.6 10/05/2018 0833   CALCIUM 9.7 06/27/2017 1324   ALKPHOS 94 03/13/2018 0951   ALKPHOS 87 06/27/2017 1324   AST 13 05/07/2018 1255   AST 21 06/27/2017 1324   ALT 12 05/07/2018 1255   ALT 32 06/27/2017 1324   BILITOT 0.3 05/07/2018 1255   BILITOT 0.32 06/27/2017 1324       RADIOGRAPHIC STUDIES: No results found.  ASSESSMENT AND PLAN:  This is a very pleasant 68 years old white female with a  stage 0 chronic lymphocytic leukemia and currently on observation. The patient is feeling fine today with no concerning complaints. Repeat CBC showed a stable white blood count of 17,600. I recommended for the patient to continue on observation with repeat CBC, comprehensive metabolic panel and LDH in 6 months. The patient was advised to call immediately if she has any concerning symptoms in the interval. The patient voices understanding of current disease status and treatment options and is in agreement with the current care plan. All questions were answered. The patient knows to call the clinic with any problems, questions or concerns. We can certainly see the patient much sooner if necessary. I spent 10 minutes counseling the patient face to face. The total time spent in the appointment was 15 minutes.  Disclaimer: This note was dictated with voice recognition software. Similar sounding words can inadvertently be transcribed and may not be corrected upon review.

## 2018-12-30 ENCOUNTER — Other Ambulatory Visit: Payer: Self-pay | Admitting: Family Medicine

## 2018-12-30 DIAGNOSIS — C9110 Chronic lymphocytic leukemia of B-cell type not having achieved remission: Secondary | ICD-10-CM

## 2018-12-31 NOTE — Telephone Encounter (Signed)
Ok to refill??  Last office visit/ refill 11/14/2018.

## 2019-01-08 DIAGNOSIS — I10 Essential (primary) hypertension: Secondary | ICD-10-CM | POA: Diagnosis not present

## 2019-01-08 DIAGNOSIS — Z6831 Body mass index (BMI) 31.0-31.9, adult: Secondary | ICD-10-CM | POA: Diagnosis not present

## 2019-01-08 DIAGNOSIS — Z981 Arthrodesis status: Secondary | ICD-10-CM | POA: Diagnosis not present

## 2019-02-01 IMAGING — MG DIGITAL SCREENING BILATERAL MAMMOGRAM WITH TOMO AND CAD
8 series · 8 of 24 positions shown · non-contrast
Comparison: Previous exam(s).

CLINICAL DATA: Screening.

EXAM:
DIGITAL SCREENING BILATERAL MAMMOGRAM WITH TOMO AND CAD

[R CC synth-2D]
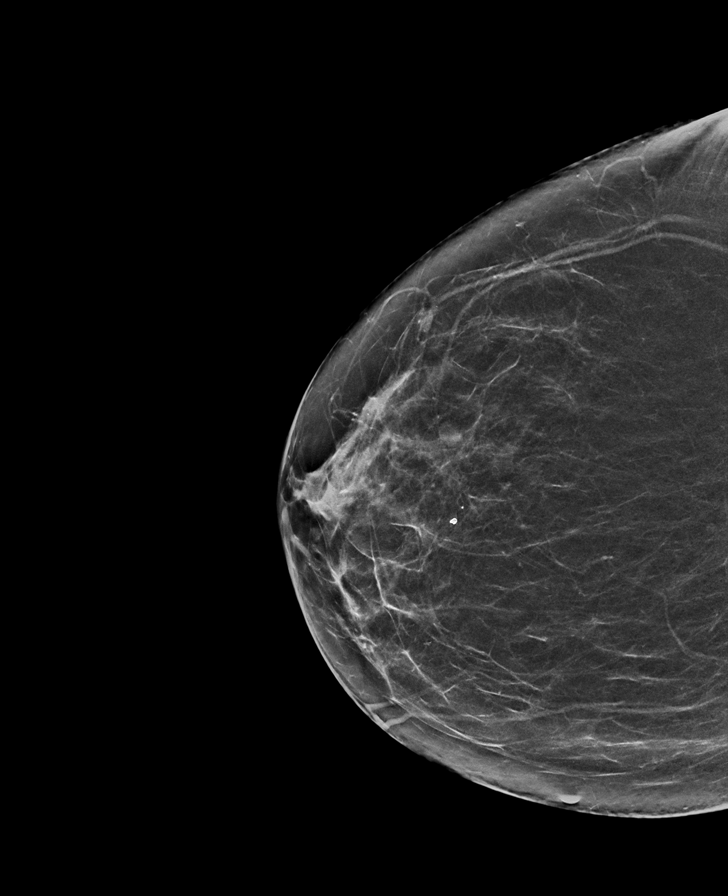

[L CC synth-2D]
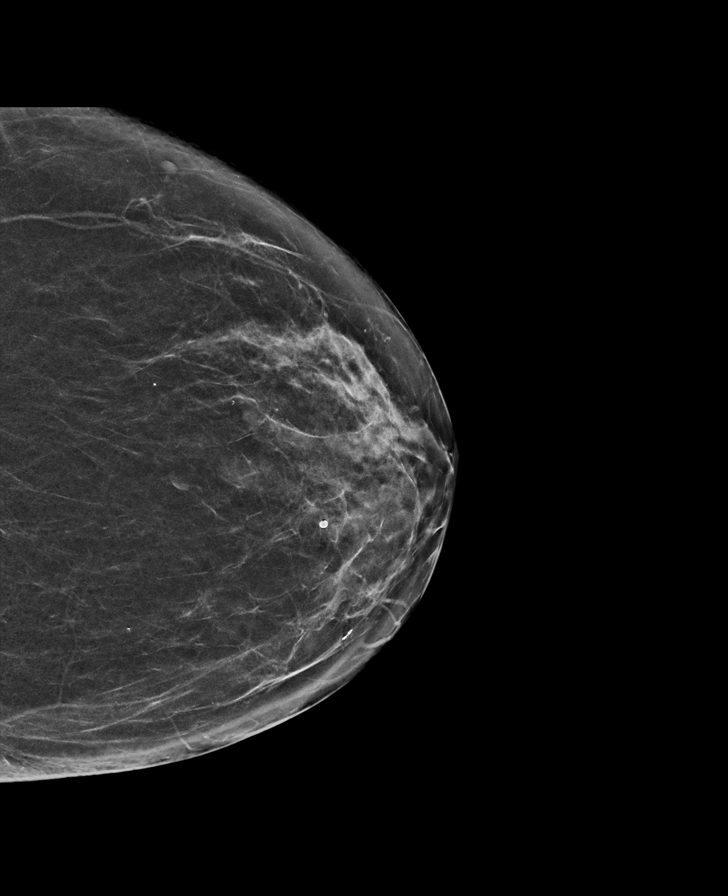

[R MLO synth-2D]
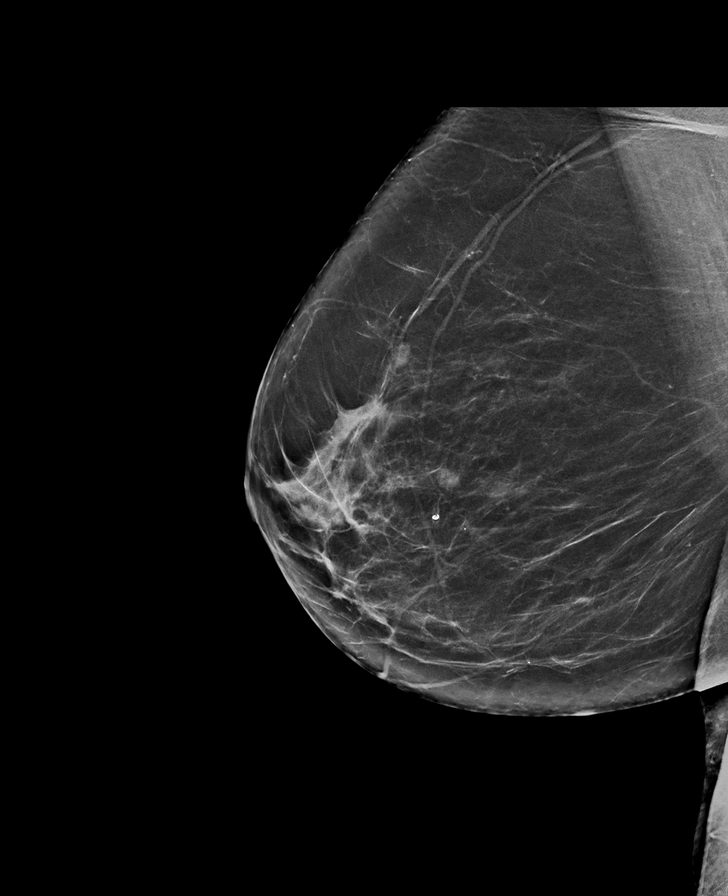

[L MLO synth-2D]
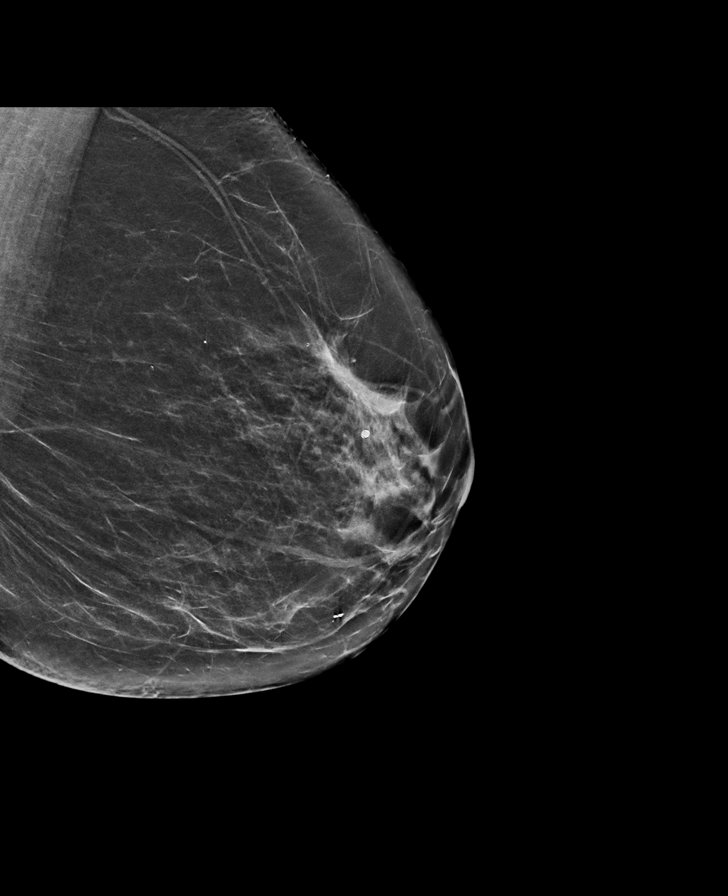

[R MLO tomo · tomo slice 36/71.0]
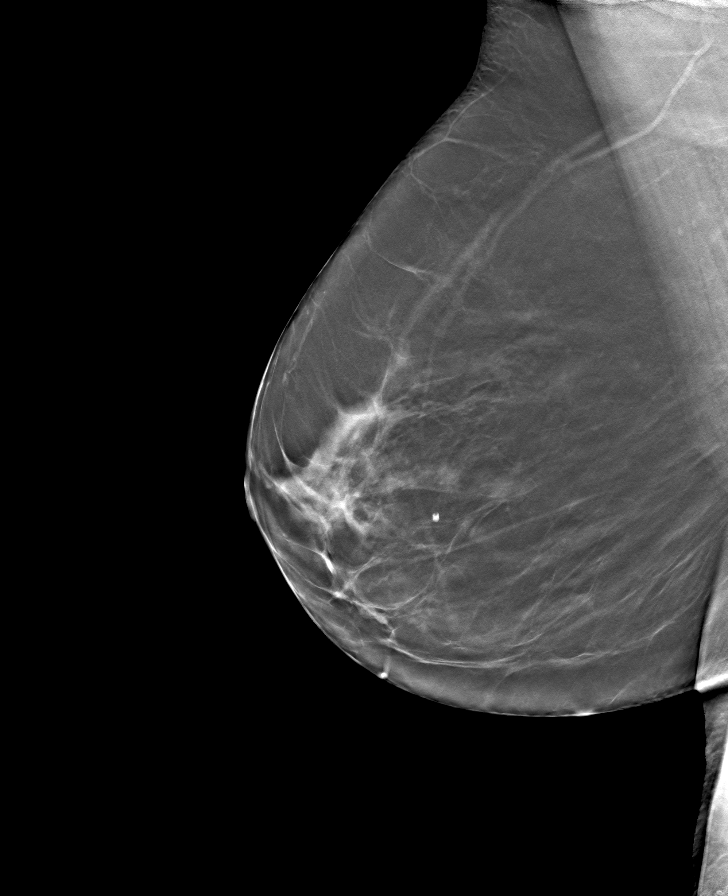

[L MLO tomo · tomo slice 34/67.0]
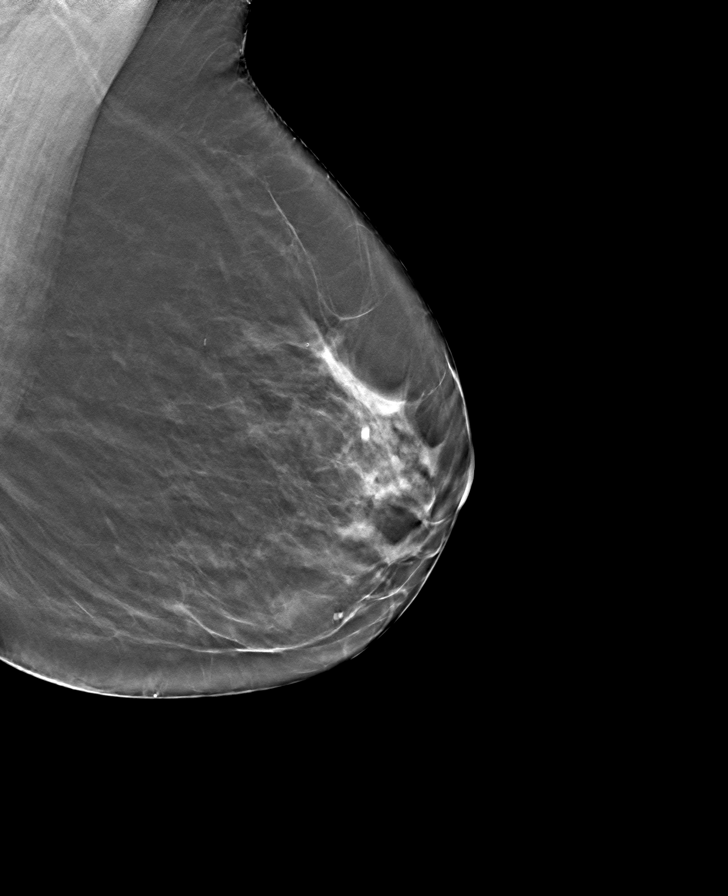

[L CC tomo · tomo slice 31/61.0]
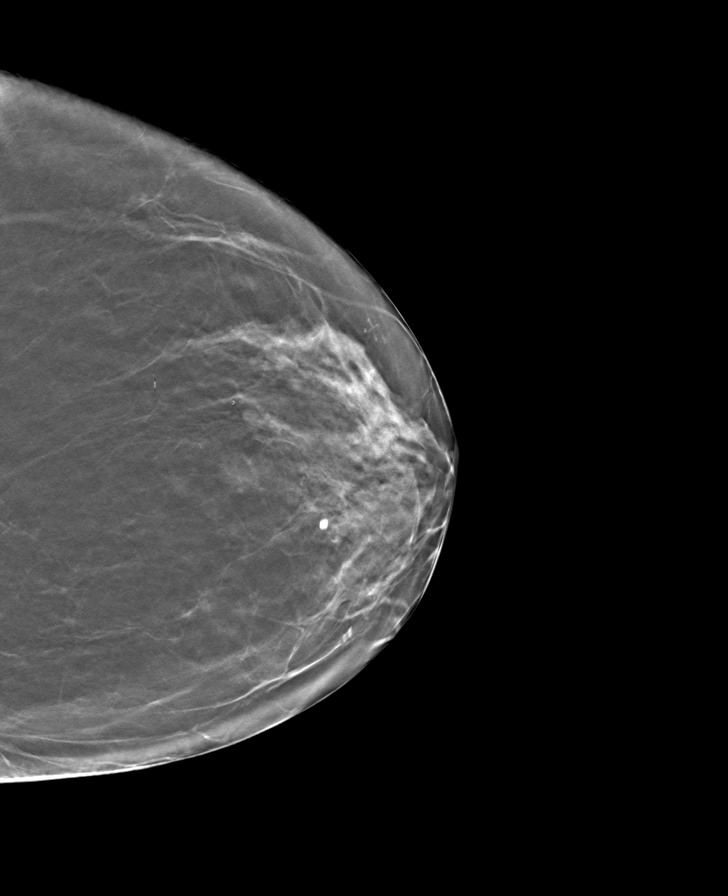

[R CC tomo · tomo slice 33/65.0]
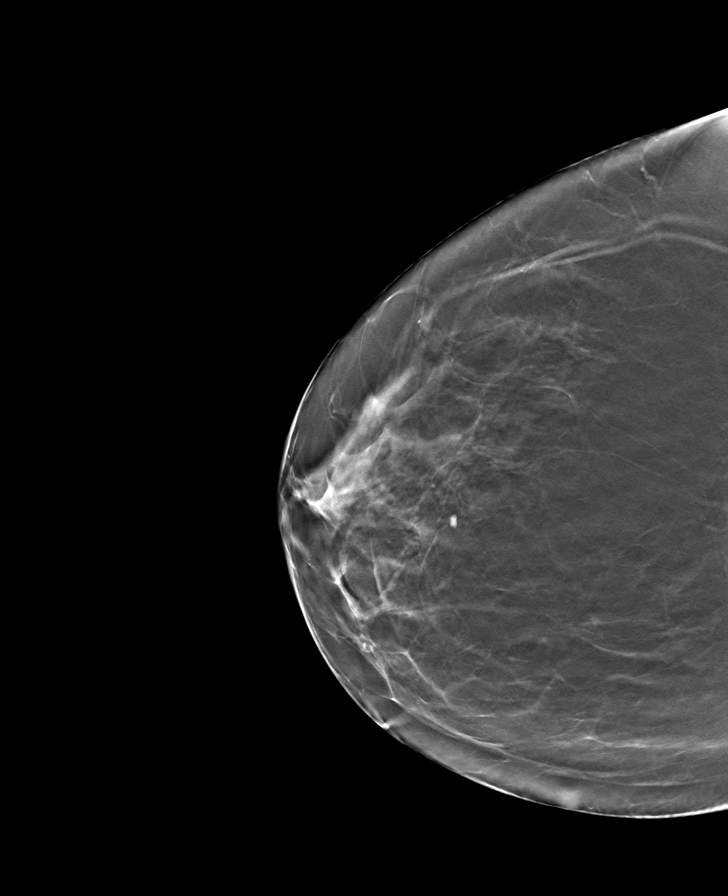

[8 of 24 positions shown; findings below may reference images not displayed]

ACR Breast Density Category b: There are scattered areas of
fibroglandular density.
FINDINGS: There are no findings suspicious for malignancy. Images were
processed with CAD.
IMPRESSION: No mammographic evidence of malignancy. A result letter of this
screening mammogram will be mailed directly to the patient.

RECOMMENDATION:
Screening mammogram in one year. (Code:[id number])

BI-RADS CATEGORY  1: Negative.

## 2019-02-11 ENCOUNTER — Other Ambulatory Visit: Payer: Self-pay | Admitting: Family Medicine

## 2019-02-11 DIAGNOSIS — C9110 Chronic lymphocytic leukemia of B-cell type not having achieved remission: Secondary | ICD-10-CM

## 2019-02-11 NOTE — Telephone Encounter (Signed)
Requesting refill     Tramadol  LOV: 11/14/2018  LRF:  12/31/18

## 2019-03-15 ENCOUNTER — Ambulatory Visit (INDEPENDENT_AMBULATORY_CARE_PROVIDER_SITE_OTHER): Payer: Medicare Other | Admitting: Family Medicine

## 2019-03-15 ENCOUNTER — Other Ambulatory Visit: Payer: Self-pay

## 2019-03-15 VITALS — BP 128/70 | HR 83 | Temp 98.7°F | Resp 18 | Ht 64.0 in | Wt 184.6 lb

## 2019-03-15 DIAGNOSIS — E669 Obesity, unspecified: Secondary | ICD-10-CM

## 2019-03-15 DIAGNOSIS — M8589 Other specified disorders of bone density and structure, multiple sites: Secondary | ICD-10-CM

## 2019-03-15 DIAGNOSIS — M8580 Other specified disorders of bone density and structure, unspecified site: Secondary | ICD-10-CM | POA: Insufficient documentation

## 2019-03-15 DIAGNOSIS — K219 Gastro-esophageal reflux disease without esophagitis: Secondary | ICD-10-CM

## 2019-03-15 DIAGNOSIS — G43709 Chronic migraine without aura, not intractable, without status migrainosus: Secondary | ICD-10-CM

## 2019-03-15 MED ORDER — FAMOTIDINE 20 MG PO TABS: 20.0000 mg | ORAL_TABLET | Freq: Two times a day (BID) | ORAL | 6 refills | Status: DC

## 2019-03-15 NOTE — Patient Instructions (Addendum)
Try pepcid  20mg  twice a day  Hold on protonix and see how your stomach dose  F/U Oct for Physical

## 2019-03-15 NOTE — Progress Notes (Signed)
   Subjective:    Patient ID: Kimberly Groleau King, female    DOB: [birth year], 68 y.o.   MRN: [id number]  Patient presents for Follow-up (4 month)    Pt here to f/u chronic medical problems    Followed by oncology CLL    Osteopenia-  Taking fosamax, calcium and vitamin D    OA- taking diclofenac/ Ultram used daily    Gerd- reflux is severe off PPI, but she did stop bedtime zantac , pepcid 20mg      Migraines- has not had any recent migraines, has not used imitrex    Review Of Systems:  GEN- denies fatigue, fever, weight loss,weakness, recent illness HEENT- denies eye drainage, change in vision, nasal discharge, CVS- denies chest pain, palpitations RESP- denies SOB, cough, wheeze ABD- denies N/V, change in stools, abd pain GU- denies dysuria, hematuria, dribbling, incontinence MSK- + joint pain, muscle aches, injury Neuro- denies headache, dizziness, syncope, seizure activity       Objective:    BP 128/70   Pulse 83   Temp 98.7 F (37.1 C)   Resp 18   Ht 5\' 4"  (1.626 m)   Wt 184 lb 9.6 oz (83.7 kg)   SpO2 97%   BMI 31.69 kg/m  GEN- NAD, alert and oriented x3 HEENT- PERRL, EOMI, non injected sclera, pink conjunctiva, MMM, oropharynx clear Neck- Supple, no thyromegaly CVS- RRR, no murmur RESP-CTAB ABD-NABS,soft,NT,ND EXT- No edema Pulses- Radial, DP- 2+        Assessment & Plan:      Problem List Items Addressed This Visit      Unprioritized   GERD (gastroesophageal reflux disease) - Primary    Discussed risk of long term use of PPI in setting of her osteopenia She will try PEPCID 20mg  BID, if this does not control symptoms return to PPI in AM , pepcid at bedtime       Relevant Medications   famotidine (PEPCID) 20 MG tablet   Migraines    Well controlled, has not required imitrex      Obesity (BMI 30-39.9)   Osteopenia    Continue fosamax, vitamin D, calcium History of fractures         Note: This dictation was prepared with Dragon dictation  along with smaller phrase technology. Any transcriptional errors that result from this process are unintentional.

## 2019-03-17 ENCOUNTER — Encounter: Payer: Self-pay | Admitting: Family Medicine

## 2019-03-17 NOTE — Assessment & Plan Note (Signed)
Continue fosamax, vitamin D, calcium History of fractures

## 2019-03-17 NOTE — Assessment & Plan Note (Signed)
Discussed risk of long term use of PPI in setting of her osteopenia She will try PEPCID 20mg  BID, if this does not control symptoms return to PPI in AM , pepcid at bedtime

## 2019-03-17 NOTE — Assessment & Plan Note (Signed)
Well controlled, has not required imitrex

## 2019-03-20 ENCOUNTER — Ambulatory Visit (INDEPENDENT_AMBULATORY_CARE_PROVIDER_SITE_OTHER): Payer: Medicare Other

## 2019-03-20 ENCOUNTER — Other Ambulatory Visit: Payer: Self-pay

## 2019-03-20 ENCOUNTER — Ambulatory Visit (INDEPENDENT_AMBULATORY_CARE_PROVIDER_SITE_OTHER): Payer: Medicare Other | Admitting: Orthopaedic Surgery

## 2019-03-20 ENCOUNTER — Encounter: Payer: Self-pay | Admitting: Orthopaedic Surgery

## 2019-03-20 DIAGNOSIS — Z96652 Presence of left artificial knee joint: Secondary | ICD-10-CM

## 2019-03-20 NOTE — Progress Notes (Signed)
Office Visit Note   Patient: Kimberly King           Date of Birth: 1950-12-07           MRN: [id number] Visit Date: 03/20/2019              Requested by: Alycia Rossetti, MD [address] [place], [place] [address] PCP: Alycia Rossetti, MD   Assessment & Plan: Visit Diagnoses:  1. S/P TKR (total knee replacement), left     Plan: 1 year TKA follow up plan  Patient is now one year out from a left total knee arthroplasty. Patient is doing very well and very pleased with the results. Radiographs reveal a total knee arthroplasty in good position, with no evidence of subsidence, loosening, or complicating features. It was reinforced that prophylactic antibiotics should be taken with any procedure including but not limited to dental work or colonoscopies.  We will see her back in another year with 2 view x-rays of the left knee.  She knows that she can call anytime should she have any issues.  Follow-Up Instructions: Return in about 1 year (around 03/19/2020).   Orders:  Orders Placed This Encounter  Procedures  . XR Knee 1-2 Views Left   No orders of the defined types were placed in this encounter.     Procedures: No procedures performed   Clinical Data: No additional findings.   Subjective: Chief Complaint  Patient presents with  . Left Knee - Follow-up    Patient follows up today for her one-year visit status post left total knee replacement.  Overall she is doing well.  She has been exercising on the exercise bike.  She takes at most of tramadol a day.  She has resumed her normal activities.  She has no real complaints today.   Review of Systems   Objective: Vital Signs: There were no vitals taken for this visit.  Physical Exam  Ortho Exam Surgical scar is fully healed.  Excellent range of motion.  Collateral stable. Specialty Comments:  No specialty comments available.  Imaging: Xr Knee 1-2 Views Left  Result Date: 03/20/2019 Stable total knee  replacement in good alignment.     PMFS History: Patient Active Problem List   Diagnosis Date Noted  . Osteopenia 03/15/2019  . DDD (degenerative disc disease), lumbar 11/12/2018  . HNP (herniated nucleus pulposus), cervical 10/15/2018  . Mild hyperlipidemia 05/23/2018  . S/P TKR (total knee replacement), left 03/19/2018  . Obesity (BMI 30-39.9) 12/31/2017  . Primary localized osteoarthritis of knees, bilateral 12/05/2017  . Primary localized osteoarthritis of right hip 12/05/2017  . Migraines 07/01/2017  . Chronic neck and back pain 07/01/2017  . Thyroid nodule 06/30/2017  . GERD (gastroesophageal reflux disease) 06/30/2017  . Vaginal atrophy 06/30/2017  . Subclinical hyperthyroidism 12/06/2016  . CLL (chronic lymphocytic leukemia) ([facility]) 12/17/2013  . COLONIC POLYPS, ADENOMATOUS 10/26/2007   Past Medical History:  Diagnosis Date  . Allergy   . Anxiety   . Arthritis    knees hips neck  . Cancer [facility])    leukemia dx 06/2013 - no current treatment - being monitored  . Cataract    bilateral - being monitored, no treatment currently  . CLL (chronic lymphocytic leukemia) ([facility])   . Depression   . GERD (gastroesophageal reflux disease)   . Leukocytosis    and low blood sodium   . Migraines   . Thyroid nodule    JUST WATCHING  Family History  Problem Relation Age of Onset  . Colon cancer Paternal Grandmother 35  . Esophageal cancer Paternal Uncle   . Arthritis Mother   . Heart disease Father   . Lung cancer Father   . Lung cancer Maternal Grandfather   . Breast cancer Maternal Grandmother   . Cirrhosis Brother   . Rectal cancer Neg Hx   . Stomach cancer Neg Hx     Past Surgical History:  Procedure Laterality Date  . ANTERIOR CERVICAL DECOMP/DISCECTOMY FUSION N/A 10/15/2018   Procedure: PARTIAL REMOVAL OF ANTERIOR CERVICAL PLATE,ANTERIOR CERVICAL DECOMPRESSION/DISCECTOMY FUSION TWO  CERVICAL THREE- CERVICAL FOUR, CERVICAL FOUR- CERVICAL FIVE;  Surgeon: Jovita Gamma, MD;  Location: [facility];  Service: Neurosurgery;  Laterality: N/A;  PARTIAL REMOVAL OF ANTERIOR CERVICAL PLATE,ANTERIOR CERVICAL DECOMPRESSION/DISCECTOMY FUSION TWO  CERVICAL THREE- CERVICAL FOUR, CERVICAL   . APPENDECTOMY    . BLADDER SURGERY     tack  . CERVICAL FUSION     x2  -  C5-C6  . COLONOSCOPY  2014   pyrtle - hx polyps  . DILATION AND CURETTAGE OF UTERUS     x 2  . JOINT REPLACEMENT    . knee     arthroscopy    left   07/2017  . KNEE SURGERY Left 2018  . TONSILLECTOMY    . TOTAL KNEE ARTHROPLASTY Left 03/19/2018   Procedure: LEFT TOTAL KNEE ARTHROPLASTY;  Surgeon: Leandrew Koyanagi, MD;  Location: [facility];  Service: Orthopedics;  Laterality: Left;   Social History   Occupational History  . Not on file  Tobacco Use  . Smoking status: Former Smoker    Packs/day: 0.25    Years: 30.00    Pack years: 7.50    Types: Cigarettes    Last attempt to quit: 04/22/2015    Years since quitting: 3.9  . Smokeless tobacco: Never Used  Substance and Sexual Activity  . Alcohol use: Yes    Comment: Occasional  . Drug use: No  . Sexual activity: Not on file

## 2019-04-01 ENCOUNTER — Other Ambulatory Visit: Payer: Self-pay | Admitting: Family Medicine

## 2019-04-01 DIAGNOSIS — C9110 Chronic lymphocytic leukemia of B-cell type not having achieved remission: Secondary | ICD-10-CM

## 2019-04-01 NOTE — Telephone Encounter (Signed)
Ok to refill??  Last office visit 03/15/2019.  Last refill 02/11/2019.

## 2019-04-19 DIAGNOSIS — H2513 Age-related nuclear cataract, bilateral: Secondary | ICD-10-CM | POA: Diagnosis not present

## 2019-04-19 DIAGNOSIS — H25013 Cortical age-related cataract, bilateral: Secondary | ICD-10-CM | POA: Diagnosis not present

## 2019-04-19 DIAGNOSIS — H40013 Open angle with borderline findings, low risk, bilateral: Secondary | ICD-10-CM | POA: Diagnosis not present

## 2019-04-19 DIAGNOSIS — H04123 Dry eye syndrome of bilateral lacrimal glands: Secondary | ICD-10-CM | POA: Diagnosis not present

## 2019-04-24 ENCOUNTER — Other Ambulatory Visit: Payer: Self-pay | Admitting: Family Medicine

## 2019-04-24 ENCOUNTER — Encounter: Payer: Self-pay | Admitting: Family Medicine

## 2019-04-24 ENCOUNTER — Other Ambulatory Visit: Payer: Self-pay

## 2019-04-24 ENCOUNTER — Ambulatory Visit (INDEPENDENT_AMBULATORY_CARE_PROVIDER_SITE_OTHER): Payer: Medicare Other | Admitting: Family Medicine

## 2019-04-24 DIAGNOSIS — Z20822 Contact with and (suspected) exposure to covid-19: Secondary | ICD-10-CM

## 2019-04-24 DIAGNOSIS — R6889 Other general symptoms and signs: Secondary | ICD-10-CM | POA: Diagnosis not present

## 2019-04-24 NOTE — Progress Notes (Signed)
Virtual Visit via Telephone Note  I connected with Kimberly King on 04/24/19 at 10:58am by telephone and verified that I am speaking with the correct person using two identifiers.   I discussed the limitations, risks, security and privacy concerns of performing an evaluation and management service by telephone and the availability of in person appointments. I also discussed with the patient that there may be a patient responsible charge related to this service. The patient expressed understanding and agreed to proceed.   History of Present Illness:  Monday night started with sweats, bodyachesTemp spiked to  100.49F, sore throat/ bad headache. Taking tyllenol sinus.  This AM fever 99.49F. Decreased appetite. No vomiting or diarrhea No SOB  Works at [employer] place no known direct exposure    Observations/Objective: NAD noted on phone   Assessment and Plan: Viral symptoms concerning for COVID-19, works in healthcare setting  discussed red flags, SOB, Chest pain to go to ER  Continue fever reducer, keep hydrated COVID testing ordered   Follow Up Instructions:    I discussed the assessment and treatment plan with the patient. The patient was provided an opportunity to ask questions and all were answered. The patient agreed with the plan and demonstrated an understanding of the instructions.   The patient was advised to call back or seek an in-person evaluation if the symptoms worsen or if the condition fails to improve as anticipated.  I provided 7 minutes of non-face-to-face time during this encounter. End Time 11:05am  Vic Blackbird, MD

## 2019-05-01 ENCOUNTER — Telehealth: Payer: Self-pay | Admitting: *Deleted

## 2019-05-01 NOTE — Telephone Encounter (Signed)
She has to have results, I can not use the non tested guidelines

## 2019-05-01 NOTE — Telephone Encounter (Signed)
Received call from patient.   Inquired as to the status of COVID testing. Advised that [facility] does have a backlog they are working through.  Reports that she is scheduled for work this weekend. Reports that she is feeling much better now and inquired about return to work process. Advised that patient must have at least 10 days following possible exposure, improvement in symptoms and have gone >72 hours with out a fever or the use of APAP/IBU. States that she has met all above criteria.   Inquired as to if she can have letter to return to work based on non-testing criteria from CDC even though testing has not resulted.   MD please advise.  

## 2019-05-01 NOTE — Telephone Encounter (Signed)
Call placed to patient and patient made aware.  

## 2019-05-01 NOTE — Telephone Encounter (Signed)
Call placed to [facility].   Patient testing has resulted and is not detected. Will fax report to office.

## 2019-05-01 NOTE — Telephone Encounter (Signed)
Negative Covid Symptoms resolved can return to work

## 2019-05-02 ENCOUNTER — Other Ambulatory Visit: Payer: Self-pay | Admitting: Family Medicine

## 2019-05-02 DIAGNOSIS — C9110 Chronic lymphocytic leukemia of B-cell type not having achieved remission: Secondary | ICD-10-CM

## 2019-05-02 NOTE — Telephone Encounter (Signed)
Ok to refill??  Last office visit 04/24/2019.  Last refill 04/01/2019.

## 2019-05-17 DIAGNOSIS — M4712 Other spondylosis with myelopathy, cervical region: Secondary | ICD-10-CM | POA: Diagnosis not present

## 2019-05-17 DIAGNOSIS — G5603 Carpal tunnel syndrome, bilateral upper limbs: Secondary | ICD-10-CM | POA: Diagnosis not present

## 2019-05-17 DIAGNOSIS — Z981 Arthrodesis status: Secondary | ICD-10-CM | POA: Diagnosis not present

## 2019-05-24 DIAGNOSIS — Z23 Encounter for immunization: Secondary | ICD-10-CM | POA: Diagnosis not present

## 2019-06-03 ENCOUNTER — Other Ambulatory Visit: Payer: Self-pay | Admitting: Family Medicine

## 2019-06-03 DIAGNOSIS — C9110 Chronic lymphocytic leukemia of B-cell type not having achieved remission: Secondary | ICD-10-CM

## 2019-06-03 NOTE — Telephone Encounter (Signed)
Ok to refill??  Last office visit 04/24/2019.  Last refill 05/02/2019.

## 2019-06-10 DIAGNOSIS — G5603 Carpal tunnel syndrome, bilateral upper limbs: Secondary | ICD-10-CM | POA: Diagnosis not present

## 2019-06-12 ENCOUNTER — Other Ambulatory Visit: Payer: Self-pay | Admitting: Family Medicine

## 2019-06-12 DIAGNOSIS — M47812 Spondylosis without myelopathy or radiculopathy, cervical region: Secondary | ICD-10-CM | POA: Diagnosis not present

## 2019-06-12 DIAGNOSIS — M9902 Segmental and somatic dysfunction of thoracic region: Secondary | ICD-10-CM | POA: Diagnosis not present

## 2019-06-12 DIAGNOSIS — M546 Pain in thoracic spine: Secondary | ICD-10-CM | POA: Diagnosis not present

## 2019-06-12 DIAGNOSIS — M9901 Segmental and somatic dysfunction of cervical region: Secondary | ICD-10-CM | POA: Diagnosis not present

## 2019-06-12 DIAGNOSIS — M47816 Spondylosis without myelopathy or radiculopathy, lumbar region: Secondary | ICD-10-CM | POA: Diagnosis not present

## 2019-06-12 DIAGNOSIS — M9903 Segmental and somatic dysfunction of lumbar region: Secondary | ICD-10-CM | POA: Diagnosis not present

## 2019-06-20 ENCOUNTER — Telehealth: Payer: Self-pay | Admitting: Internal Medicine

## 2019-06-20 NOTE — Telephone Encounter (Signed)
Returned patient's phone call regarding rescheduling an appointment, per patient's request 09/16 appointments have been moved to 09/21 due to patient being in quarantine.

## 2019-06-24 DIAGNOSIS — Z20828 Contact with and (suspected) exposure to other viral communicable diseases: Secondary | ICD-10-CM | POA: Diagnosis not present

## 2019-06-26 ENCOUNTER — Other Ambulatory Visit: Payer: Medicare Other

## 2019-06-26 ENCOUNTER — Ambulatory Visit: Payer: Medicare Other | Admitting: Internal Medicine

## 2019-07-01 ENCOUNTER — Inpatient Hospital Stay (HOSPITAL_BASED_OUTPATIENT_CLINIC_OR_DEPARTMENT_OTHER): Payer: Medicare Other | Admitting: Physician Assistant

## 2019-07-01 ENCOUNTER — Encounter: Payer: Self-pay | Admitting: Physician Assistant

## 2019-07-01 ENCOUNTER — Inpatient Hospital Stay: Payer: Medicare Other | Attending: Internal Medicine

## 2019-07-01 ENCOUNTER — Other Ambulatory Visit: Payer: Self-pay

## 2019-07-01 VITALS — BP 156/80 | HR 76 | Temp 98.2°F | Resp 18 | Ht 64.0 in | Wt 184.9 lb

## 2019-07-01 DIAGNOSIS — R030 Elevated blood-pressure reading, without diagnosis of hypertension: Secondary | ICD-10-CM | POA: Insufficient documentation

## 2019-07-01 DIAGNOSIS — C9110 Chronic lymphocytic leukemia of B-cell type not having achieved remission: Secondary | ICD-10-CM | POA: Diagnosis not present

## 2019-07-01 DIAGNOSIS — F419 Anxiety disorder, unspecified: Secondary | ICD-10-CM | POA: Diagnosis not present

## 2019-07-01 DIAGNOSIS — Z79899 Other long term (current) drug therapy: Secondary | ICD-10-CM | POA: Diagnosis not present

## 2019-07-01 LAB — CBC WITH DIFFERENTIAL (CANCER CENTER ONLY)
Abs Immature Granulocytes: 0.08 10*3/uL — ABNORMAL HIGH (ref 0.00–0.07)
Basophils Absolute: 0.1 10*3/uL (ref 0.0–0.1)
Basophils Relative: 1 %
Eosinophils Absolute: 0.3 10*3/uL (ref 0.0–0.5)
Eosinophils Relative: 2 %
HCT: 36.2 % (ref 36.0–46.0)
Hemoglobin: 11.1 g/dL — ABNORMAL LOW (ref 12.0–15.0)
Immature Granulocytes: 0 %
Lymphocytes Relative: 54 %
Lymphs Abs: 9.8 10*3/uL — ABNORMAL HIGH (ref 0.7–4.0)
MCH: 25.6 pg — ABNORMAL LOW (ref 26.0–34.0)
MCHC: 30.7 g/dL (ref 30.0–36.0)
MCV: 83.4 fL (ref 80.0–100.0)
Monocytes Absolute: 1.7 10*3/uL — ABNORMAL HIGH (ref 0.1–1.0)
Monocytes Relative: 10 %
Neutro Abs: 5.8 10*3/uL (ref 1.7–7.7)
Neutrophils Relative %: 33 %
Platelet Count: 378 10*3/uL (ref 150–400)
RBC: 4.34 MIL/uL (ref 3.87–5.11)
RDW: 15.9 % — ABNORMAL HIGH (ref 11.5–15.5)
WBC Count: 17.8 10*3/uL — ABNORMAL HIGH (ref 4.0–10.5)
nRBC: 0 % (ref 0.0–0.2)

## 2019-07-01 LAB — CMP (CANCER CENTER ONLY)
ALT: 32 U/L (ref 0–44)
AST: 22 U/L (ref 15–41)
Albumin: 4.5 g/dL (ref 3.5–5.0)
Alkaline Phosphatase: 83 U/L (ref 38–126)
Anion gap: 9 (ref 5–15)
BUN: 17 mg/dL (ref 8–23)
CO2: 28 mmol/L (ref 22–32)
Calcium: 9.5 mg/dL (ref 8.9–10.3)
Chloride: 103 mmol/L (ref 98–111)
Creatinine: 0.8 mg/dL (ref 0.44–1.00)
GFR, Est AFR Am: >60 mL/min (ref >60)
GFR, Estimated: >60 mL/min (ref >60)
Glucose, Bld: 95 mg/dL (ref 70–99)
Potassium: 4.2 mmol/L (ref 3.5–5.1)
Sodium: 140 mmol/L (ref 135–145)
Total Bilirubin: 0.3 mg/dL (ref 0.3–1.2)
Total Protein: 7.1 g/dL (ref 6.5–8.1)

## 2019-07-01 LAB — LACTATE DEHYDROGENASE: LDH: 148 U/L (ref 98–192)

## 2019-07-01 NOTE — Progress Notes (Signed)
[facility] OFFICE PROGRESS NOTE  [place], [place]ena Nunnery, MD [address] [place] [address]  DIAGNOSIS: Stage 0 chronic lymphocytic leukemia  PRIOR THERAPY: None    CURRENT THERAPY: Observation  INTERVAL HISTORY: Kimberly King 68 y.o. female returns to the clinic for a follow-up visit.  The patient is feeling well today without any concerning complaints except for fatigue. She also expresses that because of COVID that she has been unable to see her family/mother in assisted living, which has been difficult for her. She has several follow up appointments with her other providers such as her PCP, dermatologist, as well as eye doctor to follow up on her other health concerns and preventative screenings. She denies any fever, chills, night sweats, or weight loss. She denies any lymphadenopathy.  She denies any chest pain, shortness of breath, cough, or hemoptysis.  She denies any nausea, vomiting, diarrhea, constipation, or early satiety.  She denies any recent infections.  She denies any bleeding or bruising including melena, hematochezia, hematuria, gingival bleeding, and epistaxis. She is here today for a 6 month follow up evaluation and repeat blood work.   MEDICAL HISTORY: Past Medical History:  Diagnosis Date  . Allergy   . Anxiety   . Arthritis    knees hips neck  . Cancer [facility])    leukemia dx 06/2013 - no current treatment - being monitored  . Cataract    bilateral - being monitored, no treatment currently  . CLL (chronic lymphocytic leukemia) ([place])   . Depression   . GERD (gastroesophageal reflux disease)   . Leukocytosis    and low blood sodium   . Migraines   . Thyroid nodule    JUST WATCHING    ALLERGIES:  has No Known Allergies.  MEDICATIONS:  Current Outpatient Medications  Medication Sig Dispense Refill  . acetaminophen (TYLENOL) 325 MG tablet Take 650 mg by mouth every 6 (six) hours as needed for moderate pain or headache.    . alendronate  (FOSAMAX) 70 MG tablet Take 1 tablet (70 mg total) by mouth every 7 (seven) days. Take with a full glass of water on an empty stomach. 4 tablet 11  . Calcium Carb-Cholecalciferol (CALCIUM 600 + D PO) Take by mouth daily.    . Cholecalciferol (VITAMIN D3) 125 MCG (5000 UT) CAPS Take by mouth daily.    . DENTA 5000 PLUS 1.1 % CREA dental cream Place 1 application onto teeth at bedtime. brush teeth for 2 minutes and spit out do not rinse  2  . diclofenac (VOLTAREN) 75 MG EC tablet Take 75 mg by mouth daily.  3  . famotidine (PEPCID) 20 MG tablet Take 1 tablet (20 mg total) by mouth 2 (two) times daily. 60 tablet 6  . fluticasone (FLONASE) 50 MCG/ACT nasal spray Place 1 spray into both nostrils daily.     . Ginger Root POWD by Does not apply route.    Marland Kitchen GLUCOSAMINE-CHONDROITIN PO Take 1 tablet by mouth daily.     . Misc Natural Products (TART CHERRY ADVANCED) CAPS Take by mouth.    . Omega-3 Fatty Acids (FISH OIL) 1000 MG CAPS Take by mouth.    . pantoprazole (PROTONIX) 40 MG tablet TAKE 1 TABLET BY MOUTH EVERY DAY 90 tablet 3  . Polyethyl Glycol-Propyl Glycol (SYSTANE OP) Place 1 drop into both eyes 2 (two) times daily as needed (for dry eyes).     Marland Kitchen Specialty Vitamins Products (ICAPS LUTEIN & ZEAXANTHIN PO)  Take 1 capsule by mouth daily.    . SUMAtriptan (IMITREX) 50 MG tablet Take 50 mg by mouth every 2 (two) hours as needed for migraine.     . traMADol (ULTRAM) 50 MG tablet TAKE 1 TABLET BY MOUTH 2 TIMES DAILY AS NEEDED FOR MODERATE PAIN. 60 tablet 2  . TURMERIC CURCUMIN PO Take 1 capsule by mouth daily.     No current facility-administered medications for this visit.     SURGICAL HISTORY:  Past Surgical History:  Procedure Laterality Date  . ANTERIOR CERVICAL DECOMP/DISCECTOMY FUSION N/A 10/15/2018   Procedure: PARTIAL REMOVAL OF ANTERIOR CERVICAL PLATE,ANTERIOR CERVICAL DECOMPRESSION/DISCECTOMY FUSION TWO  CERVICAL THREE- CERVICAL FOUR, CERVICAL FOUR- CERVICAL FIVE;  Surgeon: Jovita Gamma, MD;  Location: [facility];  Service: Neurosurgery;  Laterality: N/A;  PARTIAL REMOVAL OF ANTERIOR CERVICAL PLATE,ANTERIOR CERVICAL DECOMPRESSION/DISCECTOMY FUSION TWO  CERVICAL THREE- CERVICAL FOUR, CERVICAL   . APPENDECTOMY    . BLADDER SURGERY     tack  . CERVICAL FUSION     x2  -  C5-C6  . COLONOSCOPY  2014   pyrtle - hx polyps  . DILATION AND CURETTAGE OF UTERUS     x 2  . JOINT REPLACEMENT    . knee     arthroscopy    left   07/2017  . KNEE SURGERY Left 2018  . TONSILLECTOMY    . TOTAL KNEE ARTHROPLASTY Left 03/19/2018   Procedure: LEFT TOTAL KNEE ARTHROPLASTY;  Surgeon: Leandrew Koyanagi, MD;  Location: [facility];  Service: Orthopedics;  Laterality: Left;    REVIEW OF SYSTEMS:   Review of Systems  Constitutional: Positive for fatigue. Negative for appetite change, chills, fever and unexpected weight change.  HENT: Negative for mouth sores, nosebleeds, sore throat and trouble swallowing.   Eyes: Positive for eye problems ("early" glaucoma followed by opthalmology). Negative for icterus.  Respiratory: Negative for cough, hemoptysis, shortness of breath and wheezing.   Cardiovascular: Negative for chest pain and leg swelling.  Gastrointestinal: Negative for abdominal pain, constipation, diarrhea, nausea and vomiting.  Genitourinary: Negative for bladder incontinence, difficulty urinating, dysuria, frequency and hematuria.   Musculoskeletal: Positive for numbness and tingling in the distribution of her first 4 fingers secondary to carpal tunnel. Negative for back pain, gait problem, neck pain and neck stiffness.  Skin: Positive for rash and frequent itching (followed by dermatology).   Neurological: Negative for dizziness, extremity weakness, gait problem, headaches, light-headedness and seizures.  Hematological: Negative for adenopathy. Does not bruise/bleed easily.  Psychiatric/Behavioral: Negative for confusion, depression and sleep disturbance. The patient is not nervous/anxious.      PHYSICAL EXAMINATION:  Blood pressure (!) 156/80, pulse 76, temperature 98.2 F (36.8 C), temperature source Temporal, resp. rate 18, height 5\' 4"  (1.626 m), weight 184 lb 14.4 oz (83.9 kg), SpO2 99 %.  ECOG PERFORMANCE STATUS: 1 - Symptomatic but completely ambulatory  Physical Exam  Constitutional: Oriented to person, place, and time and well-developed, well-nourished, and in no distress.  HENT:  Head: Normocephalic and atraumatic.  Mouth/Throat: Oropharynx is clear and moist. No oropharyngeal exudate.  Eyes: Conjunctivae are normal. Right eye exhibits no discharge. Left eye exhibits no discharge. No scleral icterus.  Neck: Normal range of motion. Neck supple.  Cardiovascular: Normal rate, regular rhythm, normal heart sounds and intact distal pulses.   Pulmonary/Chest: Effort normal and breath sounds normal. No respiratory distress. No wheezes. No rales.  Abdominal: Soft. Bowel sounds are normal. Exhibits no distension and no mass. There is no tenderness.  Musculoskeletal: Normal range of motion. Exhibits no edema.  Lymphadenopathy:    No cervical adenopathy.  Neurological: Alert and oriented to person, place, and time. Exhibits normal muscle tone. Gait normal. Coordination normal.  Skin: Rash around her neck and upper chest. Skin is warm and dry. Not diaphoretic. No erythema. No pallor.  Psychiatric: Mood, memory and judgment normal.  Vitals reviewed.  LABORATORY DATA: Lab Results  Component Value Date   WBC 17.8 (H) 07/01/2019   HGB 11.1 (L) 07/01/2019   HCT 36.2 07/01/2019   MCV 83.4 07/01/2019   PLT 378 07/01/2019      Chemistry      Component Value Date/Time   NA 140 07/01/2019 0833   NA 139 06/27/2017 1324   K 4.2 07/01/2019 0833   K 3.5 06/27/2017 1324   CL 103 07/01/2019 0833   CO2 28 07/01/2019 0833   CO2 26 06/27/2017 1324   BUN 17 07/01/2019 0833   BUN 22.4 06/27/2017 1324   CREATININE 0.80 07/01/2019 0833   CREATININE 0.91 05/07/2018 1255    CREATININE 0.8 06/27/2017 1324      Component Value Date/Time   CALCIUM 9.5 07/01/2019 0833   CALCIUM 9.7 06/27/2017 1324   ALKPHOS 83 07/01/2019 0833   ALKPHOS 87 06/27/2017 1324   AST 22 07/01/2019 0833   AST 21 06/27/2017 1324   ALT 32 07/01/2019 0833   ALT 32 06/27/2017 1324   BILITOT 0.3 07/01/2019 0833   BILITOT 0.32 06/27/2017 1324       RADIOGRAPHIC STUDIES:  No results found.   ASSESSMENT/PLAN:  This is a very pleasant 68 year old Caucasian female with stage 0 chronic lymphocytic leukemia.  She is currently on observation.  The patient is feeling well today without any concerning complaints. The patient was seen with Dr. Julien Nordmann today.  Labs were reviewed.  Repeat CBC shows stable white blood cell count of 17.8.   Dr. Julien Nordmann recommends that the patient continue on observation with repeat CBC, CMP, and LDH in 6 months.  The patient will follow up with her primary doctor on 07/15/2019 for her routine wellness visit. The patient's blood pressure was noted to be elevated today. She was advised to monitor her blood pressure at home and keep a log of her readings to share with her PCP on 07/15/2019 to determine if she should be on anti-hypertensives.  She will follow up with dermatology for her persistent rash on 07/22/2019.  The patient was advised to call immediately if she has any concerning symptoms in the interval. The patient voices understanding of current disease status and treatment options and is in agreement with the current care plan. All questions were answered. The patient knows to call the clinic with any problems, questions or concerns. We can certainly see the patient much sooner if necessary  Orders Placed This Encounter  Procedures  . CMP ([facility] only)    Standing Status:   Future    Standing Expiration Date:   06/30/2020  . CBC with Differential (Cancer Center Only)    Standing Status:   Future    Standing Expiration Date:   06/30/2020  .  Lactate dehydrogenase (LDH)    Standing Status:   Future    Standing Expiration Date:   06/30/2020     Tobe Sos , PA-C 07/01/19  ADDENDUM: Hematology/Oncology Attending: I had a face-to-face encounter with the patient today.  I recommended her care plan.  This is a very pleasant 68 years old white female with stage  0 chronic lymphocytic leukemia.  The patient is currently on observation and she is feeling fine. She had repeat CBC performed earlier today.  Her CBC showed no concerning findings for progression. I recommended for the patient to continue on observation with repeat CBC, comprehensive metabolic panel and LDH in 6 months. The patient was advised to call immediately if she has any concerning symptoms in the interval.  Disclaimer: This note was dictated with voice recognition software. Similar sounding words can inadvertently be transcribed and may be missed upon review. Eilleen Kempf, MD 07/01/19

## 2019-07-02 ENCOUNTER — Telehealth: Payer: Self-pay | Admitting: Internal Medicine

## 2019-07-02 NOTE — Telephone Encounter (Signed)
Scheduled appt per 9/21 los - mailed letter with appt date and time   

## 2019-07-10 ENCOUNTER — Other Ambulatory Visit: Payer: Medicare Other

## 2019-07-10 ENCOUNTER — Other Ambulatory Visit: Payer: Self-pay

## 2019-07-10 DIAGNOSIS — Z79899 Other long term (current) drug therapy: Secondary | ICD-10-CM | POA: Diagnosis not present

## 2019-07-10 DIAGNOSIS — Z0000 Encounter for general adult medical examination without abnormal findings: Secondary | ICD-10-CM | POA: Diagnosis not present

## 2019-07-10 DIAGNOSIS — E785 Hyperlipidemia, unspecified: Secondary | ICD-10-CM | POA: Diagnosis not present

## 2019-07-10 DIAGNOSIS — E0590 Thyrotoxicosis, unspecified without thyrotoxic crisis or storm: Secondary | ICD-10-CM | POA: Diagnosis not present

## 2019-07-11 ENCOUNTER — Other Ambulatory Visit: Payer: Self-pay | Admitting: *Deleted

## 2019-07-11 DIAGNOSIS — E041 Nontoxic single thyroid nodule: Secondary | ICD-10-CM

## 2019-07-11 LAB — CBC WITH DIFFERENTIAL/PLATELET
Absolute Monocytes: 740 cells/uL (ref 200–950)
Basophils Absolute: 123 cells/uL (ref 0–200)
Basophils Relative: 0.9 %
Eosinophils Absolute: 247 cells/uL (ref 15–500)
Eosinophils Relative: 1.8 %
HCT: 35.9 % (ref 35.0–45.0)
Hemoglobin: 11.2 g/dL — ABNORMAL LOW (ref 11.7–15.5)
Lymphs Abs: 8604 cells/uL — ABNORMAL HIGH (ref 850–3900)
MCH: 26.2 pg — ABNORMAL LOW (ref 27.0–33.0)
MCHC: 31.2 g/dL — ABNORMAL LOW (ref 32.0–36.0)
MCV: 84.1 fL (ref 80.0–100.0)
MPV: 10.2 fL (ref 7.5–12.5)
Monocytes Relative: 5.4 %
Neutro Abs: 3987 cells/uL (ref 1500–7800)
Neutrophils Relative %: 29.1 %
Platelets: 447 10*3/uL — ABNORMAL HIGH (ref 140–400)
RBC: 4.27 10*6/uL (ref 3.80–5.10)
RDW: 14.5 % (ref 11.0–15.0)
Total Lymphocyte: 62.8 %
WBC: 13.7 10*3/uL — ABNORMAL HIGH (ref 3.8–10.8)

## 2019-07-11 LAB — LIPID PANEL
Cholesterol: 191 mg/dL (ref <200)
HDL: 53 mg/dL (ref >=50)
LDL Cholesterol (Calc): 118 mg/dL (calc) — ABNORMAL HIGH
Non-HDL Cholesterol (Calc): 138 mg/dL (calc) — ABNORMAL HIGH (ref <130)
Total CHOL/HDL Ratio: 3.6 (calc) (ref <5.0)
Triglycerides: 102 mg/dL (ref <150)

## 2019-07-11 LAB — COMPREHENSIVE METABOLIC PANEL
AG Ratio: 2.2 (calc) (ref 1.0–2.5)
ALT: 36 U/L — ABNORMAL HIGH (ref 6–29)
AST: 27 U/L (ref 10–35)
Albumin: 4.6 g/dL (ref 3.6–5.1)
Alkaline phosphatase (APISO): 73 U/L (ref 37–153)
BUN: 14 mg/dL (ref 7–25)
CO2: 29 mmol/L (ref 20–32)
Calcium: 9.6 mg/dL (ref 8.6–10.4)
Chloride: 102 mmol/L (ref 98–110)
Creat: 0.74 mg/dL (ref 0.50–0.99)
Globulin: 2.1 g/dL (calc) (ref 1.9–3.7)
Glucose, Bld: 99 mg/dL (ref 65–99)
Potassium: 4.5 mmol/L (ref 3.5–5.3)
Sodium: 139 mmol/L (ref 135–146)
Total Bilirubin: 0.5 mg/dL (ref 0.2–1.2)
Total Protein: 6.7 g/dL (ref 6.1–8.1)

## 2019-07-11 LAB — TSH: TSH: 1.04 mIU/L (ref 0.40–4.50)

## 2019-07-12 ENCOUNTER — Other Ambulatory Visit: Payer: Self-pay

## 2019-07-15 ENCOUNTER — Telehealth: Payer: Self-pay | Admitting: Family Medicine

## 2019-07-15 ENCOUNTER — Ambulatory Visit (INDEPENDENT_AMBULATORY_CARE_PROVIDER_SITE_OTHER): Payer: Medicare Other | Admitting: Family Medicine

## 2019-07-15 ENCOUNTER — Encounter: Payer: Self-pay | Admitting: Family Medicine

## 2019-07-15 ENCOUNTER — Other Ambulatory Visit: Payer: Self-pay

## 2019-07-15 VITALS — BP 128/72 | HR 80 | Temp 98.9°F | Resp 16 | Ht 64.0 in | Wt 185.0 lb

## 2019-07-15 DIAGNOSIS — Z0001 Encounter for general adult medical examination with abnormal findings: Secondary | ICD-10-CM

## 2019-07-15 DIAGNOSIS — E041 Nontoxic single thyroid nodule: Secondary | ICD-10-CM | POA: Diagnosis not present

## 2019-07-15 DIAGNOSIS — K219 Gastro-esophageal reflux disease without esophagitis: Secondary | ICD-10-CM | POA: Diagnosis not present

## 2019-07-15 DIAGNOSIS — F4323 Adjustment disorder with mixed anxiety and depressed mood: Secondary | ICD-10-CM | POA: Diagnosis not present

## 2019-07-15 DIAGNOSIS — Z0000 Encounter for general adult medical examination without abnormal findings: Secondary | ICD-10-CM

## 2019-07-15 DIAGNOSIS — Z23 Encounter for immunization: Secondary | ICD-10-CM | POA: Diagnosis not present

## 2019-07-15 DIAGNOSIS — M5136 Other intervertebral disc degeneration, lumbar region: Secondary | ICD-10-CM | POA: Diagnosis not present

## 2019-07-15 DIAGNOSIS — M8589 Other specified disorders of bone density and structure, multiple sites: Secondary | ICD-10-CM

## 2019-07-15 MED ORDER — DULOXETINE HCL 30 MG PO CPEP: 30.0000 mg | ORAL_CAPSULE | Freq: Every day | ORAL | 3 refills | Status: DC

## 2019-07-15 NOTE — Progress Notes (Signed)
Subjective:   Patient presents for Medicare Annual/Subsequent preventive examination.    Pt here for annual physical  Medications reviewed Reviewed fasting labs at bedside    Followed by oncology for CLL    Ostepenia- started on fosamax, she is on vitamin D and Calcium    GERD- continues on protonix and pepcid as needed , continues to have sore throat    Chronic pain- DDD lumbar,chronic neck pain- ultram as needed , s/p knee replacement   Seen by Dr.Neudelman diagnosed with carpal tunnel, wearing braces during the day, but still has nerve pain.   She has had increased stressors recently.  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  This stresses her out throughout the day and she just feels on edge.  She would like to try something to help calm her nerves.  In the past she was on Lexapro but had side effects with this medication. Review Past Medical/Family/Social: Per EMR    Risk Factors  Current exercise habits: staying ac Dietary issues discussed:   Cardiac risk factors: Obesity (BMI >= 30 kg/m2).   Depression Screen  (Note: if answer to either of the following is "Yes", a more complete depression screening is indicated)  Over the past two weeks, have you felt down, depressed or hopeless? No Over the past two weeks, have you felt little interest or pleasure in doing things? No Have you lost interest or pleasure in daily life? No Do you often feel hopeless? No Do you cry easily over simple problems? No   Activities of Daily Living  In your present state of health, do you have any difficulty performing the following activities?:  Driving? No  Managing money? No  Feeding yourself? No  Getting from bed to chair? No  Climbing a flight of stairs? No  Preparing food and eating?: No  Bathing or showering? No  Getting dressed: No  Getting to the toilet? No  Using the toilet:No   Moving around from place to place: No  In the past year have you fallen or had a near fall?:No  Are you sexually active? No  Do you have more than one partner? No   Hearing Difficulties: No  Do you often ask people to speak up or repeat themselves? No  Do you experience ringing or noises in your ears? No Do you have difficulty understanding soft or whispered voices? No  Do you feel that you have a problem with memory? No Do you often misplace items? No  Do you feel safe at home? Yes  Cognitive Testing  Alert? Yes Normal Appearance?Yes  Oriented to person? Yes Place? Yes  Time? Yes  Recall of three objects? Yes  Can perform simple calculations? Yes  Displays appropriate judgment?Yes  Can read the correct time from a watch face?Yes   List the Names of Other Physician/Practitioners you currently use:   Dermatology, Oncology , eye doctor- Dr. Herbert Deaner  , GI - Dr. Hilarie Fredrickson   Screening Tests / Date Colonoscopy   UTD                  Zostavax  Mammogram UTD  Influenza Vaccine  Pneumonia vaccine- Prevnar 13 UTD, due to repeat pneumovax Tetanus/tdap - Due  Flu shot UTD   ROS: GEN- denies fatigue, fever, weight loss,weakness, recent illness HEENT- denies eye drainage, change in vision, nasal discharge, CVS-  denies chest pain, palpitations RESP- denies SOB, cough, wheeze ABD- denies N/V, change in stools, abd pain GU- denies dysuria, hematuria, dribbling, incontinence MSK- denies joint pain, muscle aches, injury Neuro- denies headache, dizziness, syncope, seizure activity  Physical: GEN- NAD, alert and oriented x3 HEENT- PERRL, EOMI, non injected sclera, pink conjunctiva, MMM, oropharynx clear Neck- Supple, no thryomegaly CVS- RRR, no murmur RESP-CTAB ABD-NABS,soft,NT,ND EXT- No edema Pulses- Radial, DP- 2+   Fall/Audit C neg Depressions creen PHQ 9 score  5   Assessment:    Annual wellness medicare exam   Plan:    During the course of the visit the patient was  educated and counseled about appropriate screening and preventive services including:    Osteopenia- continue calcium and vitamin D, fosamax  Hyperlipidemia- much improved with dietary changes   GERD- continue PPI and pepcid as needed   Thyroid nodule- [place] being set up, TFT normal   Chronic pain- ultram as needed  CLL- per oncology, WBC improved   Situational anxiety and depression we will start her on Cymbalta 30 mg once a day.  She does not have a known diagnosis of glaucoma but she will wait and start this after she sees her eye doctor.  I think that this may also help with some of the nerve pain with her carpal tunnel she does not want pursue surgery at this time. We will follow-up by phone in 6 weeks to see how the medication is doing and titrate as needed.       Medicare Attestation  I have personally reviewed:  The patient's medical and social history  Their use of alcohol, tobacco or illicit drugs  Their current medications and supplements  The patient's functional ability including ADLs,fall risks, home safety risks, cognitive, and hearing and visual impairment  Diet and physical activities  Evidence for depression or mood disorders  The patient's weight, height, BMI, and visual acuity have been recorded in the chart. I have made referrals, counseling, and provided education to the patient based on review of the above and I have provided the patient with a written personalized care plan for preventive services.

## 2019-07-15 NOTE — Patient Instructions (Addendum)
Try Cymbalta 30mg  once a day for pain and mood  Schedule mammogram in december Pneumonia vaccine F/U 6 weeks telephone

## 2019-07-15 NOTE — Telephone Encounter (Signed)
Patient called in stating that she read the medication information on cymbalta and it stated not to use if you have closed angle glaucoma. Patient states that she is actually being tested for glaucoma and has an appointment with the ophthalmologist next week. Patient wants to know if she should hold off on taking until after that appointment. Please advise?

## 2019-07-15 NOTE — Telephone Encounter (Signed)
Okay to see the ophthalmologist first,If she is not given that diagnosis go ahead and start the medication

## 2019-07-15 NOTE — Telephone Encounter (Signed)
Left message return call

## 2019-07-16 ENCOUNTER — Encounter: Payer: Self-pay | Admitting: Family Medicine

## 2019-07-16 NOTE — Telephone Encounter (Signed)
Spoke with patient and informed her of her Dr. Dorian Heckle recommendations. Patient verbalized understanding.

## 2019-07-17 DIAGNOSIS — G5603 Carpal tunnel syndrome, bilateral upper limbs: Secondary | ICD-10-CM | POA: Diagnosis not present

## 2019-07-17 DIAGNOSIS — Z981 Arthrodesis status: Secondary | ICD-10-CM | POA: Diagnosis not present

## 2019-07-22 ENCOUNTER — Other Ambulatory Visit: Payer: Medicare Other

## 2019-07-24 DIAGNOSIS — H21233 Degeneration of iris (pigmentary), bilateral: Secondary | ICD-10-CM | POA: Diagnosis not present

## 2019-07-24 DIAGNOSIS — H40013 Open angle with borderline findings, low risk, bilateral: Secondary | ICD-10-CM | POA: Diagnosis not present

## 2019-07-24 DIAGNOSIS — H04123 Dry eye syndrome of bilateral lacrimal glands: Secondary | ICD-10-CM | POA: Diagnosis not present

## 2019-07-26 DIAGNOSIS — Z85820 Personal history of malignant melanoma of skin: Secondary | ICD-10-CM | POA: Diagnosis not present

## 2019-07-26 DIAGNOSIS — Z85828 Personal history of other malignant neoplasm of skin: Secondary | ICD-10-CM | POA: Diagnosis not present

## 2019-07-26 DIAGNOSIS — D2271 Melanocytic nevi of right lower limb, including hip: Secondary | ICD-10-CM | POA: Diagnosis not present

## 2019-07-26 DIAGNOSIS — Z86018 Personal history of other benign neoplasm: Secondary | ICD-10-CM | POA: Diagnosis not present

## 2019-07-26 DIAGNOSIS — Z23 Encounter for immunization: Secondary | ICD-10-CM | POA: Diagnosis not present

## 2019-07-26 DIAGNOSIS — L816 Other disorders of diminished melanin formation: Secondary | ICD-10-CM | POA: Diagnosis not present

## 2019-07-31 ENCOUNTER — Ambulatory Visit
Admission: RE | Admit: 2019-07-31 | Discharge: 2019-07-31 | Disposition: A | Discharge status: Home / Self Care | Payer: Medicare Other | Source: Ambulatory Visit | Attending: Family Medicine | Admitting: Family Medicine

## 2019-07-31 DIAGNOSIS — E042 Nontoxic multinodular goiter: Secondary | ICD-10-CM | POA: Diagnosis not present

## 2019-07-31 DIAGNOSIS — E041 Nontoxic single thyroid nodule: Secondary | ICD-10-CM

## 2019-08-01 ENCOUNTER — Telehealth: Payer: Self-pay | Admitting: Physical Medicine and Rehabilitation

## 2019-08-01 NOTE — Telephone Encounter (Signed)
Scheduled for 11-11

## 2019-08-01 NOTE — Telephone Encounter (Signed)
ok 

## 2019-08-06 ENCOUNTER — Other Ambulatory Visit: Payer: Self-pay | Admitting: Family Medicine

## 2019-08-07 ENCOUNTER — Other Ambulatory Visit: Payer: Self-pay | Admitting: Family Medicine

## 2019-08-12 DIAGNOSIS — G5603 Carpal tunnel syndrome, bilateral upper limbs: Secondary | ICD-10-CM | POA: Diagnosis not present

## 2019-08-12 DIAGNOSIS — G5601 Carpal tunnel syndrome, right upper limb: Secondary | ICD-10-CM | POA: Diagnosis not present

## 2019-08-15 ENCOUNTER — Other Ambulatory Visit: Payer: Self-pay | Admitting: Family Medicine

## 2019-08-15 DIAGNOSIS — Z1231 Encounter for screening mammogram for malignant neoplasm of breast: Secondary | ICD-10-CM

## 2019-08-21 ENCOUNTER — Ambulatory Visit (INDEPENDENT_AMBULATORY_CARE_PROVIDER_SITE_OTHER): Payer: Medicare Other | Admitting: Physical Medicine and Rehabilitation

## 2019-08-21 ENCOUNTER — Other Ambulatory Visit: Payer: Self-pay

## 2019-08-21 ENCOUNTER — Ambulatory Visit: Payer: Medicare Other

## 2019-08-21 ENCOUNTER — Encounter: Payer: Self-pay | Admitting: Physical Medicine and Rehabilitation

## 2019-08-21 DIAGNOSIS — M25551 Pain in right hip: Secondary | ICD-10-CM | POA: Diagnosis not present

## 2019-08-21 NOTE — Progress Notes (Signed)
 .  Numeric Pain Rating Scale and Functional Assessment Average Pain 4   In the last MONTH (on 0-10 scale) has pain interfered with the following?  1. General activity like being  able to carry out your everyday physical activities such as walking, climbing stairs, carrying groceries, or moving a chair?  Rating(5)   , -Dye Allergies.

## 2019-08-21 NOTE — Progress Notes (Signed)
Kimberly King - 68 y.o. female MRN [id number]  Date of birth: 27-Feb-1951  Office Visit Note: Visit Date: 08/21/2019 PCP: Alycia Rossetti, MD Referred by: Alycia Rossetti, MD  Subjective: Chief Complaint  Patient presents with  . Right Hip - Pain   HPI:  Kimberly King is a 68 y.o. female who comes in today For reevaluation management of low back and right hip and groin pain.  She has been followed from a cervical and lumbar spine perspective by Dr. Sherwood Gambler at [facility].  She has had prior cervical fusion.  She is also been followed by Dr. Eduard Roux status post knee arthroplasty.  We saw her in Feb 2020 with diagnostic anesthetic hip arthrogram that was very beneficial to her.  She reports that it helped for a couple of months postinjection the pain slowly came back.  She reports average pain is 4 out of 10.  She does report hip and groin pain.  Denies any left-sided complaints.  She reports riding stationary bike seems to help.  She has a lot of questions today about her hip.  X-ray imaging shows end-stage arthritis of the hip.  She has not follow-up with Dr. Wyline Copas recently.  She has had no recent trauma.  She has no paresthesias or radicular pain.  Review of Systems  Constitutional: Negative for chills, fever, malaise/fatigue and weight loss.  HENT: Negative for hearing loss and sinus pain.   Eyes: Negative for blurred vision, double vision and photophobia.  Respiratory: Negative for cough and shortness of breath.   Cardiovascular: Negative for chest pain, palpitations and leg swelling.  Gastrointestinal: Negative for abdominal pain, nausea and vomiting.  Genitourinary: Negative for flank pain.  Musculoskeletal: Positive for back pain and joint pain. Negative for myalgias.  Skin: Negative for itching and rash.  Neurological: Negative for tremors, focal weakness and weakness.  Endo/Heme/Allergies: Negative.   Psychiatric/Behavioral: Negative for  depression.  All other systems reviewed and are negative.  Otherwise per HPI.  Assessment & Plan: Visit Diagnoses:  1. Pain in right hip     Plan: Findings:  Chronic worsening severe right hip pain that seems to be related to the hip.  X-rays show significant osteoarthritis of the hip.  She has had no recent lumbar spine imaging that we have access to.  She does follow with Dr. Sherwood Gambler from a spine standpoint.  Exam is consistent more with hip issue than spine issue.  Last injection was diagnostic.  We will complete injection today diagnostically hopefully therapeutically.  She really needs to probably follow-up with Dr. Eduard Roux and consider hip replacement.    Meds & Orders: No orders of the defined types were placed in this encounter.   Orders Placed This Encounter  Procedures  . Large Joint Inj: R hip joint  . C-ARM NO Order    Follow-up: No follow-ups on file.   Procedures: Large Joint Inj: R hip joint on 08/21/2019 3:18 PM Indications: diagnostic evaluation and pain Details: 22 G 3.5 in needle, fluoroscopy-guided anterior approach  Arthrogram: No  Medications: 80 mg triamcinolone acetonide 40 MG/ML; 4 mL bupivacaine 0.25 % Outcome: tolerated well, no immediate complications  There was excellent flow of contrast producing a partial arthrogram of the hip. The patient did have relief of symptoms during the anesthetic phase of the injection. Procedure, treatment alternatives, risks and benefits explained, specific risks discussed. Consent was given by the patient. Immediately prior to procedure a time out was  called to verify the correct patient, procedure, equipment, support staff and site/side marked as required. Patient was prepped and draped in the usual sterile fashion.      No notes on file   Clinical History: No specialty comments available.     Objective:  VS:  HT:    WT:   BMI:     BP:   HR: bpm  TEMP: ( )  RESP:  Physical Exam Vitals and nursing note  reviewed.  Constitutional:      General: She is not in acute distress.    Appearance: Normal appearance. She is well-developed. She is not ill-appearing.  HENT:     Head: Normocephalic and atraumatic.  Eyes:     Conjunctiva/sclera: Conjunctivae normal.     Pupils: Pupils are equal, round, and reactive to light.  Cardiovascular:     Rate and Rhythm: Normal rate.     Pulses: Normal pulses.  Pulmonary:     Effort: Pulmonary effort is normal.  Musculoskeletal:     Right lower leg: No edema.     Left lower leg: No edema.     Comments: Patient has pain with internal rotation of the right hip and is pretty exquisite and reproductive of her pain.  Some pain over the greater trochanter on the right and left.  Good distal strength.  Skin:    General: Skin is warm and dry.     Findings: No erythema or rash.  Neurological:     General: No focal deficit present.     Mental Status: She is alert and oriented to person, place, and time.     Sensory: No sensory deficit.     Motor: No abnormal muscle tone.     Coordination: Coordination normal.     Gait: Gait normal.  Psychiatric:        Mood and Affect: Mood normal.        Behavior: Behavior normal.     Ortho Exam Imaging: No results found.

## 2019-08-26 ENCOUNTER — Other Ambulatory Visit: Payer: Self-pay

## 2019-08-26 ENCOUNTER — Ambulatory Visit (INDEPENDENT_AMBULATORY_CARE_PROVIDER_SITE_OTHER): Payer: Medicare Other | Admitting: Family Medicine

## 2019-08-26 DIAGNOSIS — F4323 Adjustment disorder with mixed anxiety and depressed mood: Secondary | ICD-10-CM

## 2019-08-26 MED ORDER — TRAZODONE HCL 50 MG PO TABS: 25.0000 mg | ORAL_TABLET | Freq: Every evening | ORAL | 3 refills | Status: DC | PRN

## 2019-08-26 NOTE — Progress Notes (Signed)
Virtual Visit via Telephone Note  I connected with Kimberly King on 08/26/19 at 11:13am by telephone and verified that I am speaking with the correct person using two identifiers.       Pt location: at home   Physician location:  In office, [facility], Vic Blackbird MD     On call: patient and physician   I discussed the limitations, risks, security and privacy concerns of performing an evaluation and management service by telephone and the availability of in person appointments. I also discussed with the patient that there may be a patient responsible charge related to this service. The patient expressed understanding and agreed to proceed.  Called LVM at 10:24am  Called LVM at 11:11am  History of Present Illness:  Telephone visit to follow-up medications.  She was seen about 6 weeks ago at that time started on Cymbalta secondary to her chronic pain as well as some situational depression anxiety.  Her pain is improved as she has had carpal tunnel surgery in 1 hand and has plans to have a second done in 2 weeks.  She does not seen any difference with the Cymbalta 30 mg.  She still does not sleep and is very fatigued during the day.  It is not helped with her stressors.  Of the centers around COVID-19 and not being able to be with her mother she states that some of the restrictions have been lifted at the nursing home so she has seen her little bit more which has helped.  She is willing to try something different to help with her sleep.  She did not have any major side effects with the medication but did notice a couple days that the Cymbalta seemed to keep her more awake  She was also seen by her ophthalmologist was diagnosed with dry eye syndrome her pressures in her eyes had improved but she does not have glaucoma.   Observations/Objective: NAD noted over the phone, normal speech answering questions appropriately  Assessment and Plan: Situational anxiety and depression  with insomnia.  We will start her on trazodone start with 25 mg go up to 50 mg as needed.  She has been able to see her mother more which has helped with some of her depression.  The very challenging time the setting of COVID-19 which she understands.   She will call if she is not able to sleep with this medication or has any side effects. Follow Up Instructions:    I discussed the assessment and treatment plan with the patient. The patient was provided an opportunity to ask questions and all were answered. The patient agreed with the plan and demonstrated an understanding of the instructions.   The patient was advised to call back or seek an in-person evaluation if the symptoms worsen or if the condition fails to improve as anticipated.  I provided 10  minutes of non-face-to-face time during this encounter. End Time : 11:23am   Vic Blackbird, MD

## 2019-08-27 ENCOUNTER — Encounter: Payer: Self-pay | Admitting: Family Medicine

## 2019-09-04 ENCOUNTER — Other Ambulatory Visit: Payer: Self-pay | Admitting: Family Medicine

## 2019-09-04 DIAGNOSIS — C9110 Chronic lymphocytic leukemia of B-cell type not having achieved remission: Secondary | ICD-10-CM

## 2019-09-04 NOTE — Telephone Encounter (Signed)
Ok to refill Tramadol??  Last office visit 08/26/2019.  Last refill 06/03/2019,  #2 refills.

## 2019-09-09 DIAGNOSIS — G5603 Carpal tunnel syndrome, bilateral upper limbs: Secondary | ICD-10-CM | POA: Diagnosis not present

## 2019-09-17 ENCOUNTER — Other Ambulatory Visit: Payer: Self-pay | Admitting: Family Medicine

## 2019-10-07 ENCOUNTER — Other Ambulatory Visit: Payer: Self-pay

## 2019-10-07 ENCOUNTER — Ambulatory Visit
Admission: RE | Admit: 2019-10-07 | Discharge: 2019-10-07 | Disposition: A | Discharge status: Home / Self Care | Payer: Medicare Other | Source: Ambulatory Visit | Attending: Family Medicine | Admitting: Family Medicine

## 2019-10-07 DIAGNOSIS — Z1231 Encounter for screening mammogram for malignant neoplasm of breast: Secondary | ICD-10-CM

## 2019-11-06 DIAGNOSIS — M9902 Segmental and somatic dysfunction of thoracic region: Secondary | ICD-10-CM | POA: Diagnosis not present

## 2019-11-06 DIAGNOSIS — M5441 Lumbago with sciatica, right side: Secondary | ICD-10-CM | POA: Diagnosis not present

## 2019-11-06 DIAGNOSIS — M62830 Muscle spasm of back: Secondary | ICD-10-CM | POA: Diagnosis not present

## 2019-11-06 DIAGNOSIS — M4722 Other spondylosis with radiculopathy, cervical region: Secondary | ICD-10-CM | POA: Diagnosis not present

## 2019-11-06 DIAGNOSIS — M9903 Segmental and somatic dysfunction of lumbar region: Secondary | ICD-10-CM | POA: Diagnosis not present

## 2019-11-06 DIAGNOSIS — M9901 Segmental and somatic dysfunction of cervical region: Secondary | ICD-10-CM | POA: Diagnosis not present

## 2019-11-11 DIAGNOSIS — M62830 Muscle spasm of back: Secondary | ICD-10-CM | POA: Diagnosis not present

## 2019-11-11 DIAGNOSIS — M9902 Segmental and somatic dysfunction of thoracic region: Secondary | ICD-10-CM | POA: Diagnosis not present

## 2019-11-11 DIAGNOSIS — M9901 Segmental and somatic dysfunction of cervical region: Secondary | ICD-10-CM | POA: Diagnosis not present

## 2019-11-11 DIAGNOSIS — M9903 Segmental and somatic dysfunction of lumbar region: Secondary | ICD-10-CM | POA: Diagnosis not present

## 2019-11-11 DIAGNOSIS — M4722 Other spondylosis with radiculopathy, cervical region: Secondary | ICD-10-CM | POA: Diagnosis not present

## 2019-11-11 DIAGNOSIS — M5441 Lumbago with sciatica, right side: Secondary | ICD-10-CM | POA: Diagnosis not present

## 2019-11-11 MED ORDER — BUPIVACAINE HCL 0.25 % IJ SOLN
4.0000 mL | INTRAMUSCULAR | Status: AC | PRN
  Administered 2019-08-21: 4 mL via INTRA_ARTICULAR

## 2019-11-11 MED ORDER — TRIAMCINOLONE ACETONIDE 40 MG/ML IJ SUSP
80.0000 mg | INTRAMUSCULAR | Status: AC | PRN
  Administered 2019-08-21: 80 mg via INTRA_ARTICULAR

## 2019-11-12 DIAGNOSIS — M4722 Other spondylosis with radiculopathy, cervical region: Secondary | ICD-10-CM | POA: Diagnosis not present

## 2019-11-12 DIAGNOSIS — M5441 Lumbago with sciatica, right side: Secondary | ICD-10-CM | POA: Diagnosis not present

## 2019-11-12 DIAGNOSIS — M9903 Segmental and somatic dysfunction of lumbar region: Secondary | ICD-10-CM | POA: Diagnosis not present

## 2019-11-12 DIAGNOSIS — M62830 Muscle spasm of back: Secondary | ICD-10-CM | POA: Diagnosis not present

## 2019-11-12 DIAGNOSIS — M9902 Segmental and somatic dysfunction of thoracic region: Secondary | ICD-10-CM | POA: Diagnosis not present

## 2019-11-12 DIAGNOSIS — M9901 Segmental and somatic dysfunction of cervical region: Secondary | ICD-10-CM | POA: Diagnosis not present

## 2019-11-13 DIAGNOSIS — M9901 Segmental and somatic dysfunction of cervical region: Secondary | ICD-10-CM | POA: Diagnosis not present

## 2019-11-13 DIAGNOSIS — M9903 Segmental and somatic dysfunction of lumbar region: Secondary | ICD-10-CM | POA: Diagnosis not present

## 2019-11-13 DIAGNOSIS — M62830 Muscle spasm of back: Secondary | ICD-10-CM | POA: Diagnosis not present

## 2019-11-13 DIAGNOSIS — M9902 Segmental and somatic dysfunction of thoracic region: Secondary | ICD-10-CM | POA: Diagnosis not present

## 2019-11-13 DIAGNOSIS — M4722 Other spondylosis with radiculopathy, cervical region: Secondary | ICD-10-CM | POA: Diagnosis not present

## 2019-11-13 DIAGNOSIS — M5441 Lumbago with sciatica, right side: Secondary | ICD-10-CM | POA: Diagnosis not present

## 2019-11-18 DIAGNOSIS — M9902 Segmental and somatic dysfunction of thoracic region: Secondary | ICD-10-CM | POA: Diagnosis not present

## 2019-11-18 DIAGNOSIS — M9901 Segmental and somatic dysfunction of cervical region: Secondary | ICD-10-CM | POA: Diagnosis not present

## 2019-11-18 DIAGNOSIS — M62830 Muscle spasm of back: Secondary | ICD-10-CM | POA: Diagnosis not present

## 2019-11-18 DIAGNOSIS — M9903 Segmental and somatic dysfunction of lumbar region: Secondary | ICD-10-CM | POA: Diagnosis not present

## 2019-11-18 DIAGNOSIS — M5441 Lumbago with sciatica, right side: Secondary | ICD-10-CM | POA: Diagnosis not present

## 2019-11-18 DIAGNOSIS — M4722 Other spondylosis with radiculopathy, cervical region: Secondary | ICD-10-CM | POA: Diagnosis not present

## 2019-11-19 DIAGNOSIS — M9901 Segmental and somatic dysfunction of cervical region: Secondary | ICD-10-CM | POA: Diagnosis not present

## 2019-11-19 DIAGNOSIS — M62830 Muscle spasm of back: Secondary | ICD-10-CM | POA: Diagnosis not present

## 2019-11-19 DIAGNOSIS — M9902 Segmental and somatic dysfunction of thoracic region: Secondary | ICD-10-CM | POA: Diagnosis not present

## 2019-11-19 DIAGNOSIS — M5441 Lumbago with sciatica, right side: Secondary | ICD-10-CM | POA: Diagnosis not present

## 2019-11-19 DIAGNOSIS — M9903 Segmental and somatic dysfunction of lumbar region: Secondary | ICD-10-CM | POA: Diagnosis not present

## 2019-11-19 DIAGNOSIS — M4722 Other spondylosis with radiculopathy, cervical region: Secondary | ICD-10-CM | POA: Diagnosis not present

## 2019-11-20 DIAGNOSIS — M9901 Segmental and somatic dysfunction of cervical region: Secondary | ICD-10-CM | POA: Diagnosis not present

## 2019-11-20 DIAGNOSIS — M62830 Muscle spasm of back: Secondary | ICD-10-CM | POA: Diagnosis not present

## 2019-11-20 DIAGNOSIS — M9903 Segmental and somatic dysfunction of lumbar region: Secondary | ICD-10-CM | POA: Diagnosis not present

## 2019-11-20 DIAGNOSIS — M9902 Segmental and somatic dysfunction of thoracic region: Secondary | ICD-10-CM | POA: Diagnosis not present

## 2019-11-20 DIAGNOSIS — M4722 Other spondylosis with radiculopathy, cervical region: Secondary | ICD-10-CM | POA: Diagnosis not present

## 2019-11-20 DIAGNOSIS — M5441 Lumbago with sciatica, right side: Secondary | ICD-10-CM | POA: Diagnosis not present

## 2019-11-22 DIAGNOSIS — Z981 Arthrodesis status: Secondary | ICD-10-CM | POA: Diagnosis not present

## 2019-11-25 DIAGNOSIS — M9903 Segmental and somatic dysfunction of lumbar region: Secondary | ICD-10-CM | POA: Diagnosis not present

## 2019-11-25 DIAGNOSIS — M9901 Segmental and somatic dysfunction of cervical region: Secondary | ICD-10-CM | POA: Diagnosis not present

## 2019-11-25 DIAGNOSIS — M9902 Segmental and somatic dysfunction of thoracic region: Secondary | ICD-10-CM | POA: Diagnosis not present

## 2019-11-25 DIAGNOSIS — M4722 Other spondylosis with radiculopathy, cervical region: Secondary | ICD-10-CM | POA: Diagnosis not present

## 2019-11-25 DIAGNOSIS — M62830 Muscle spasm of back: Secondary | ICD-10-CM | POA: Diagnosis not present

## 2019-11-25 DIAGNOSIS — M5441 Lumbago with sciatica, right side: Secondary | ICD-10-CM | POA: Diagnosis not present

## 2019-11-26 DIAGNOSIS — M4722 Other spondylosis with radiculopathy, cervical region: Secondary | ICD-10-CM | POA: Diagnosis not present

## 2019-11-26 DIAGNOSIS — M9901 Segmental and somatic dysfunction of cervical region: Secondary | ICD-10-CM | POA: Diagnosis not present

## 2019-11-26 DIAGNOSIS — M5441 Lumbago with sciatica, right side: Secondary | ICD-10-CM | POA: Diagnosis not present

## 2019-11-26 DIAGNOSIS — M9903 Segmental and somatic dysfunction of lumbar region: Secondary | ICD-10-CM | POA: Diagnosis not present

## 2019-11-26 DIAGNOSIS — M9902 Segmental and somatic dysfunction of thoracic region: Secondary | ICD-10-CM | POA: Diagnosis not present

## 2019-11-26 DIAGNOSIS — M62830 Muscle spasm of back: Secondary | ICD-10-CM | POA: Diagnosis not present

## 2019-11-27 DIAGNOSIS — M9902 Segmental and somatic dysfunction of thoracic region: Secondary | ICD-10-CM | POA: Diagnosis not present

## 2019-11-27 DIAGNOSIS — M4722 Other spondylosis with radiculopathy, cervical region: Secondary | ICD-10-CM | POA: Diagnosis not present

## 2019-11-27 DIAGNOSIS — M9903 Segmental and somatic dysfunction of lumbar region: Secondary | ICD-10-CM | POA: Diagnosis not present

## 2019-11-27 DIAGNOSIS — M62830 Muscle spasm of back: Secondary | ICD-10-CM | POA: Diagnosis not present

## 2019-11-27 DIAGNOSIS — M5441 Lumbago with sciatica, right side: Secondary | ICD-10-CM | POA: Diagnosis not present

## 2019-11-27 DIAGNOSIS — M9901 Segmental and somatic dysfunction of cervical region: Secondary | ICD-10-CM | POA: Diagnosis not present

## 2019-12-02 DIAGNOSIS — M9902 Segmental and somatic dysfunction of thoracic region: Secondary | ICD-10-CM | POA: Diagnosis not present

## 2019-12-02 DIAGNOSIS — M62830 Muscle spasm of back: Secondary | ICD-10-CM | POA: Diagnosis not present

## 2019-12-02 DIAGNOSIS — M4722 Other spondylosis with radiculopathy, cervical region: Secondary | ICD-10-CM | POA: Diagnosis not present

## 2019-12-02 DIAGNOSIS — M5441 Lumbago with sciatica, right side: Secondary | ICD-10-CM | POA: Diagnosis not present

## 2019-12-02 DIAGNOSIS — M9903 Segmental and somatic dysfunction of lumbar region: Secondary | ICD-10-CM | POA: Diagnosis not present

## 2019-12-02 DIAGNOSIS — M9901 Segmental and somatic dysfunction of cervical region: Secondary | ICD-10-CM | POA: Diagnosis not present

## 2019-12-03 DIAGNOSIS — M62830 Muscle spasm of back: Secondary | ICD-10-CM | POA: Diagnosis not present

## 2019-12-03 DIAGNOSIS — M9903 Segmental and somatic dysfunction of lumbar region: Secondary | ICD-10-CM | POA: Diagnosis not present

## 2019-12-03 DIAGNOSIS — M5441 Lumbago with sciatica, right side: Secondary | ICD-10-CM | POA: Diagnosis not present

## 2019-12-03 DIAGNOSIS — M4722 Other spondylosis with radiculopathy, cervical region: Secondary | ICD-10-CM | POA: Diagnosis not present

## 2019-12-03 DIAGNOSIS — M9902 Segmental and somatic dysfunction of thoracic region: Secondary | ICD-10-CM | POA: Diagnosis not present

## 2019-12-03 DIAGNOSIS — M9901 Segmental and somatic dysfunction of cervical region: Secondary | ICD-10-CM | POA: Diagnosis not present

## 2019-12-10 ENCOUNTER — Other Ambulatory Visit: Payer: Self-pay | Admitting: Family Medicine

## 2019-12-10 DIAGNOSIS — C9110 Chronic lymphocytic leukemia of B-cell type not having achieved remission: Secondary | ICD-10-CM

## 2019-12-10 NOTE — Telephone Encounter (Signed)
Ok to refill??  Last office visit 08/26/2019.  Last refill 09/04/2019, #2 refills.

## 2019-12-16 ENCOUNTER — Other Ambulatory Visit: Payer: Self-pay

## 2019-12-16 ENCOUNTER — Encounter: Payer: Self-pay | Admitting: Orthopaedic Surgery

## 2019-12-16 ENCOUNTER — Ambulatory Visit: Payer: Self-pay

## 2019-12-16 ENCOUNTER — Ambulatory Visit (INDEPENDENT_AMBULATORY_CARE_PROVIDER_SITE_OTHER): Payer: Medicare Other | Admitting: Orthopaedic Surgery

## 2019-12-16 ENCOUNTER — Ambulatory Visit (INDEPENDENT_AMBULATORY_CARE_PROVIDER_SITE_OTHER): Payer: Medicare Other

## 2019-12-16 VITALS — Ht 64.0 in | Wt 175.0 lb

## 2019-12-16 DIAGNOSIS — M1611 Unilateral primary osteoarthritis, right hip: Secondary | ICD-10-CM

## 2019-12-16 NOTE — Progress Notes (Signed)
Office Visit Note   Patient: Kimberly King           Date of Birth: 12/13/1950           MRN: [id number] Visit Date: 12/16/2019              Requested by: Alycia Rossetti, MD [address] [place],  [place] [address] PCP: Alycia Rossetti, MD   Assessment & Plan: Visit Diagnoses:  1. Primary osteoarthritis of right hip     Plan: Impression is severe right hip DJD.  Based on discussion she would like to try another cortisone injection today.  Questions encouraged and answered.  Follow-up as scheduled for her knee replacement.  Follow-Up Instructions: Return if symptoms worsen or fail to improve.   Orders:  Orders Placed This Encounter  Procedures  . XR HIP UNILAT W OR W/O PELVIS 2-3 VIEWS RIGHT  . US Guided Needle Placement - No Linked Charges   No orders of the defined types were placed in this encounter.     Procedures: No procedures performed   Clinical Data: No additional findings.   Subjective: Chief Complaint  Patient presents with  . Right Hip - Pain    Patient is a 69 year old female comes in for recheck of her right hip pain.  She has known right hip DJD.  She did get a cortisone injection in November with Dr. Ernestina Patches and helped for about 2 months.  She continues to have groin pain that radiates into the thigh.  She takes tramadol and Tylenol as needed.  She is currently not able to have a hip replacement due to work constraints.  Her plan eventually is to move down to [place] to live with her daughter and likely have a hip replacement at that time.  She recently went to a chiropractor who made adjustments has actually made the right hip pain much worse.  Denies any radicular symptoms.   Review of Systems  Constitutional: Negative.   HENT: Negative.   Eyes: Negative.   Respiratory: Negative.   Cardiovascular: Negative.   Endocrine: Negative.   Musculoskeletal: Negative.   Neurological: Negative.   Hematological: Negative.     Psychiatric/Behavioral: Negative.   All other systems reviewed and are negative.    Objective: Vital Signs: Ht 5\' 4"  (1.626 m)   Wt 175 lb (79.4 kg)   BMI 30.04 kg/m   Physical Exam Vitals and nursing note reviewed.  Constitutional:      Appearance: She is well-developed.  Pulmonary:     Effort: Pulmonary effort is normal.  Skin:    General: Skin is warm.     Capillary Refill: Capillary refill takes less than 2 seconds.  Neurological:     Mental Status: She is alert and oriented to person, place, and time.  Psychiatric:        Behavior: Behavior normal.        Thought Content: Thought content normal.        Judgment: Judgment normal.     Ortho Exam Right hip exam shows very limited range of motion secondary to severe catching pain.  Lateral hip is mildly tender to palpation. Specialty Comments:  No specialty comments available.  Imaging: US Guided Needle Placement - No Linked Charges  Result Date: 12/16/2019 Please see Notes tab for imaging impression.  XR HIP UNILAT W OR W/O PELVIS 2-3 VIEWS RIGHT  Result Date: 12/16/2019 Severe right hip DJD.  Progression compared to xrays 2 years  ago.  Flattening of femoral head.    PMFS History: Patient Active Problem List   Diagnosis Date Noted  . Primary osteoarthritis of right hip 12/16/2019  . Situational mixed anxiety and depressive disorder 07/15/2019  . Osteopenia 03/15/2019  . DDD (degenerative disc disease), lumbar 11/12/2018  . HNP (herniated nucleus pulposus), cervical 10/15/2018  . Mild hyperlipidemia 05/23/2018  . S/P TKR (total knee replacement), left 03/19/2018  . Obesity (BMI 30-39.9) 12/31/2017  . Primary localized osteoarthritis of knees, bilateral 12/05/2017  . Primary localized osteoarthritis of right hip 12/05/2017  . Migraines 07/01/2017  . Chronic neck and back pain 07/01/2017  . Thyroid nodule 06/30/2017  . GERD (gastroesophageal reflux disease) 06/30/2017  . Vaginal atrophy 06/30/2017  .  Subclinical hyperthyroidism 12/06/2016  . CLL (chronic lymphocytic leukemia) ([place]) 12/17/2013  . COLONIC POLYPS, ADENOMATOUS 10/26/2007   Past Medical History:  Diagnosis Date  . Allergy   . Anxiety   . Arthritis    knees hips neck  . Cancer [facility])    leukemia dx 06/2013 - no current treatment - being monitored  . Cataract    bilateral - being monitored, no treatment currently  . CLL (chronic lymphocytic leukemia) ([place])   . Depression   . GERD (gastroesophageal reflux disease)   . Leukocytosis    and low blood sodium   . Migraines   . Thyroid nodule    JUST WATCHING    Family History  Problem Relation Age of Onset  . Colon cancer Paternal Grandmother 20  . Esophageal cancer Paternal Uncle   . Arthritis Mother   . Heart disease Father   . Lung cancer Father   . Lung cancer Maternal Grandfather   . Breast cancer Maternal Grandmother   . Cirrhosis Brother   . Rectal cancer Neg Hx   . Stomach cancer Neg Hx     Past Surgical History:  Procedure Laterality Date  . ANTERIOR CERVICAL DECOMP/DISCECTOMY FUSION N/A 10/15/2018   Procedure: PARTIAL REMOVAL OF ANTERIOR CERVICAL PLATE,ANTERIOR CERVICAL DECOMPRESSION/DISCECTOMY FUSION TWO  CERVICAL THREE- CERVICAL FOUR, CERVICAL FOUR- CERVICAL FIVE;  Surgeon: Jovita Gamma, MD;  Location: [facility];  Service: Neurosurgery;  Laterality: N/A;  PARTIAL REMOVAL OF ANTERIOR CERVICAL PLATE,ANTERIOR CERVICAL DECOMPRESSION/DISCECTOMY FUSION TWO  CERVICAL THREE- CERVICAL FOUR, CERVICAL   . APPENDECTOMY    . BLADDER SURGERY     tack  . CERVICAL FUSION     x2  -  C5-C6  . COLONOSCOPY  2014   pyrtle - hx polyps  . DILATION AND CURETTAGE OF UTERUS     x 2  . JOINT REPLACEMENT    . knee     arthroscopy    left   07/2017  . KNEE SURGERY Left 2018  . TONSILLECTOMY    . TOTAL KNEE ARTHROPLASTY Left 03/19/2018   Procedure: LEFT TOTAL KNEE ARTHROPLASTY;  Surgeon: Leandrew Koyanagi, MD;  Location: [facility];  Service: Orthopedics;  Laterality: Left;   Social  History   Occupational History  . Not on file  Tobacco Use  . Smoking status: Former Smoker    Packs/day: 0.25    Years: 30.00    Pack years: 7.50    Types: Cigarettes    Quit date: 04/22/2015    Years since quitting: 4.6  . Smokeless tobacco: Never Used  Substance and Sexual Activity  . Alcohol use: Yes    Comment: Occasional  . Drug use: No  . Sexual activity: Not on file

## 2019-12-16 NOTE — Progress Notes (Signed)
Subjective: Patient is here for ultrasound-guided intra-articular right hip injection.   Has had improvement with previous ones.  Had second covid vaccine last week.  Objective:  Very limited IR.  Procedure: Ultrasound-guided right hip injection: After sterile prep with Betadine, injected 8 cc 1% lidocaine without epinephrine and 40 mg methylprednisolone using a 22-gauge spinal needle, passing the needle through the iliofemoral ligament into the femoral head/neck junction.  Injectate seen filling joint capsule.

## 2019-12-30 ENCOUNTER — Encounter: Payer: Self-pay | Admitting: Internal Medicine

## 2019-12-30 ENCOUNTER — Inpatient Hospital Stay: Payer: Medicare Other

## 2019-12-30 ENCOUNTER — Other Ambulatory Visit: Payer: Self-pay

## 2019-12-30 ENCOUNTER — Inpatient Hospital Stay: Payer: Medicare Other | Attending: Internal Medicine | Admitting: Internal Medicine

## 2019-12-30 VITALS — BP 149/85 | HR 83 | Temp 97.8°F | Resp 18 | Ht 64.0 in | Wt 180.4 lb

## 2019-12-30 DIAGNOSIS — Z791 Long term (current) use of non-steroidal anti-inflammatories (NSAID): Secondary | ICD-10-CM | POA: Insufficient documentation

## 2019-12-30 DIAGNOSIS — C9110 Chronic lymphocytic leukemia of B-cell type not having achieved remission: Secondary | ICD-10-CM | POA: Insufficient documentation

## 2019-12-30 DIAGNOSIS — M1990 Unspecified osteoarthritis, unspecified site: Secondary | ICD-10-CM | POA: Diagnosis not present

## 2019-12-30 LAB — CMP (CANCER CENTER ONLY)
ALT: 26 U/L (ref 0–44)
AST: 21 U/L (ref 15–41)
Albumin: 4.2 g/dL (ref 3.5–5.0)
Alkaline Phosphatase: 114 U/L (ref 38–126)
Anion gap: 10 (ref 5–15)
BUN: 17 mg/dL (ref 8–23)
CO2: 24 mmol/L (ref 22–32)
Calcium: 9.3 mg/dL (ref 8.9–10.3)
Chloride: 106 mmol/L (ref 98–111)
Creatinine: 0.75 mg/dL (ref 0.44–1.00)
GFR, Est AFR Am: >60 mL/min (ref >60)
GFR, Estimated: >60 mL/min (ref >60)
Glucose, Bld: 105 mg/dL — ABNORMAL HIGH (ref 70–99)
Potassium: 3.8 mmol/L (ref 3.5–5.1)
Sodium: 140 mmol/L (ref 135–145)
Total Bilirubin: 0.4 mg/dL (ref 0.3–1.2)
Total Protein: 7.1 g/dL (ref 6.5–8.1)

## 2019-12-30 LAB — CBC WITH DIFFERENTIAL (CANCER CENTER ONLY)
Abs Immature Granulocytes: 0.1 10*3/uL — ABNORMAL HIGH (ref 0.00–0.07)
Basophils Absolute: 0.1 10*3/uL (ref 0.0–0.1)
Basophils Relative: 1 %
Eosinophils Absolute: 0.2 10*3/uL (ref 0.0–0.5)
Eosinophils Relative: 1 %
HCT: 35.5 % — ABNORMAL LOW (ref 36.0–46.0)
Hemoglobin: 11 g/dL — ABNORMAL LOW (ref 12.0–15.0)
Immature Granulocytes: 1 %
Lymphocytes Relative: 43 %
Lymphs Abs: 8.9 10*3/uL — ABNORMAL HIGH (ref 0.7–4.0)
MCH: 25.9 pg — ABNORMAL LOW (ref 26.0–34.0)
MCHC: 31 g/dL (ref 30.0–36.0)
MCV: 83.7 fL (ref 80.0–100.0)
Monocytes Absolute: 1.2 10*3/uL — ABNORMAL HIGH (ref 0.1–1.0)
Monocytes Relative: 6 %
Neutro Abs: 10.3 10*3/uL — ABNORMAL HIGH (ref 1.7–7.7)
Neutrophils Relative %: 48 %
Platelet Count: 419 10*3/uL — ABNORMAL HIGH (ref 150–400)
RBC: 4.24 MIL/uL (ref 3.87–5.11)
RDW: 15.5 % (ref 11.5–15.5)
WBC Count: 21 10*3/uL — ABNORMAL HIGH (ref 4.0–10.5)
nRBC: 0 % (ref 0.0–0.2)

## 2019-12-30 LAB — LACTATE DEHYDROGENASE: LDH: 233 U/L — ABNORMAL HIGH (ref 98–192)

## 2019-12-30 NOTE — Progress Notes (Signed)
[facility] [phone]   [phone]  OFFICE PROGRESS NOTE  Alycia Rossetti, MD [address] [place] [address]  DIAGNOSIS: Stage 0 chronic lymphocytic leukemia  PRIOR THERAPY: None  CURRENT THERAPY: Observation.  INTERVAL HISTORY: Kimberly King 69 y.o. female returns to the clinic today for 6 months follow-up visit.  The patient is feeling fine today with no concerning complaints except for arthralgia in her knees.  She denied having any fatigue or weakness.  She denied having any weight loss or night sweats.  She has no nausea, vomiting, diarrhea or constipation.  She denied having any headache or visual changes.  She has no chest pain, shortness of breath, cough or hemoptysis.  The patient is here today for evaluation and repeat blood work.  MEDICAL HISTORY: Past Medical History:  Diagnosis Date  . Allergy   . Anxiety   . Arthritis    knees hips neck  . Cancer [facility])    leukemia dx 06/2013 - no current treatment - being monitored  . Cataract    bilateral - being monitored, no treatment currently  . CLL (chronic lymphocytic leukemia) ([place])   . Depression   . GERD (gastroesophageal reflux disease)   . Leukocytosis    and low blood sodium   . Migraines   . Thyroid nodule    JUST WATCHING    ALLERGIES:  has No Known Allergies.  MEDICATIONS:  Current Outpatient Medications  Medication Sig Dispense Refill  . acetaminophen (TYLENOL) 325 MG tablet Take 650 mg by mouth every 6 (six) hours as needed for moderate pain or headache.    . alendronate (FOSAMAX) 70 MG tablet TAKE 1 TABLET (70 MG TOTAL) BY MOUTH EVERY 7 (SEVEN) DAYS. TAKE WITH A FULL GLASS OF WATER ON AN EMPTY STOMACH. 12 tablet 3  . Calcium Carb-Cholecalciferol (CALCIUM 600 + D PO) Take by mouth daily.    . Cholecalciferol (VITAMIN D3) 125 MCG (5000 UT) CAPS Take by mouth daily.    . DENTA 5000 PLUS 1.1 % CREA dental cream Place 1 application onto teeth at  bedtime. brush teeth for 2 minutes and spit out do not rinse  2  . diclofenac (VOLTAREN) 75 MG EC tablet Take 75 mg by mouth daily.  3  . famotidine (PEPCID) 20 MG tablet TAKE 1 TABLET BY MOUTH TWICE A DAY 180 tablet 2  . fluticasone (FLONASE) 50 MCG/ACT nasal spray Place 1 spray into both nostrils daily.     . Ginger Root POWD by Does not apply route.    Marland Kitchen GLUCOSAMINE-CHONDROITIN PO Take 1 tablet by mouth daily.     . Misc Natural Products (TART CHERRY ADVANCED) CAPS Take by mouth.    . Omega-3 Fatty Acids (FISH OIL) 1000 MG CAPS Take by mouth.    . pantoprazole (PROTONIX) 40 MG tablet TAKE 1 TABLET BY MOUTH EVERY DAY 90 tablet 3  . Polyethyl Glycol-Propyl Glycol (SYSTANE OP) Place 1 drop into both eyes 2 (two) times daily as needed (for dry eyes).     Marland Kitchen Specialty Vitamins Products (ICAPS LUTEIN & ZEAXANTHIN PO) Take 1 capsule by mouth daily.    . SUMAtriptan (IMITREX) 50 MG tablet Take 50 mg by mouth every 2 (two) hours as needed for migraine.     . traMADol (ULTRAM) 50 MG tablet TAKE 1 TABLET BY MOUTH 2 TIMES DAILY AS NEEDED FOR MODERATE PAIN. 60 tablet 2  . traZODone (DESYREL) 50 MG  tablet TAKE 0.5-1 TABLETS (25-50 MG TOTAL) BY MOUTH AT BEDTIME AS NEEDED FOR SLEEP. 90 tablet 2  . TURMERIC CURCUMIN PO Take 1 capsule by mouth daily.     No current facility-administered medications for this visit.    SURGICAL HISTORY:  Past Surgical History:  Procedure Laterality Date  . ANTERIOR CERVICAL DECOMP/DISCECTOMY FUSION N/A 10/15/2018   Procedure: PARTIAL REMOVAL OF ANTERIOR CERVICAL PLATE,ANTERIOR CERVICAL DECOMPRESSION/DISCECTOMY FUSION TWO  CERVICAL THREE- CERVICAL FOUR, CERVICAL FOUR- CERVICAL FIVE;  Surgeon: Jovita Gamma, MD;  Location: [facility];  Service: Neurosurgery;  Laterality: N/A;  PARTIAL REMOVAL OF ANTERIOR CERVICAL PLATE,ANTERIOR CERVICAL DECOMPRESSION/DISCECTOMY FUSION TWO  CERVICAL THREE- CERVICAL FOUR, CERVICAL   . APPENDECTOMY    . BLADDER SURGERY     tack  . CERVICAL FUSION       x2  -  C5-C6  . COLONOSCOPY  2014   pyrtle - hx polyps  . DILATION AND CURETTAGE OF UTERUS     x 2  . JOINT REPLACEMENT    . knee     arthroscopy    left   07/2017  . KNEE SURGERY Left 2018  . TONSILLECTOMY    . TOTAL KNEE ARTHROPLASTY Left 03/19/2018   Procedure: LEFT TOTAL KNEE ARTHROPLASTY;  Surgeon: Leandrew Koyanagi, MD;  Location: [facility];  Service: Orthopedics;  Laterality: Left;    REVIEW OF SYSTEMS:  A comprehensive review of systems was negative except for: Constitutional: positive for fatigue Musculoskeletal: positive for arthralgias   PHYSICAL EXAMINATION: General appearance: alert, cooperative and no distress Head: Normocephalic, without obvious abnormality, atraumatic Neck: no adenopathy, no JVD, supple, symmetrical, trachea midline and thyroid not enlarged, symmetric, no tenderness/mass/nodules Lymph nodes: Cervical, supraclavicular, and axillary nodes normal. Resp: clear to auscultation bilaterally Back: symmetric, no curvature. ROM normal. No CVA tenderness. Cardio: regular rate and rhythm, S1, S2 normal, no murmur, click, rub or gallop GI: soft, non-tender; bowel sounds normal; no masses,  no organomegaly Extremities: extremities normal, atraumatic, no cyanosis or edema  ECOG PERFORMANCE STATUS: 0 - Asymptomatic  Blood pressure (!) 149/85, pulse 83, temperature 97.8 F (36.6 C), temperature source Oral, resp. rate 18, height 5\' 4"  (1.626 m), weight 180 lb 6.4 oz (81.8 kg), SpO2 98 %.  LABORATORY DATA: Lab Results  Component Value Date   WBC 21.0 (H) 12/30/2019   HGB 11.0 (L) 12/30/2019   HCT 35.5 (L) 12/30/2019   MCV 83.7 12/30/2019   PLT 419 (H) 12/30/2019      Chemistry      Component Value Date/Time   NA 140 12/30/2019 0906   NA 139 06/27/2017 1324   K 3.8 12/30/2019 0906   K 3.5 06/27/2017 1324   CL 106 12/30/2019 0906   CO2 24 12/30/2019 0906   CO2 26 06/27/2017 1324   BUN 17 12/30/2019 0906   BUN 22.4 06/27/2017 1324   CREATININE 0.75  12/30/2019 0906   CREATININE 0.74 07/10/2019 0819   CREATININE 0.8 06/27/2017 1324      Component Value Date/Time   CALCIUM 9.3 12/30/2019 0906   CALCIUM 9.7 06/27/2017 1324   ALKPHOS 114 12/30/2019 0906   ALKPHOS 87 06/27/2017 1324   AST 21 12/30/2019 0906   AST 21 06/27/2017 1324   ALT 26 12/30/2019 0906   ALT 32 06/27/2017 1324   BILITOT 0.4 12/30/2019 0906   BILITOT 0.32 06/27/2017 1324       RADIOGRAPHIC STUDIES: No results found.  ASSESSMENT AND PLAN:  This is a very pleasant 69 years old  white female with a stage 0 chronic lymphocytic leukemia and currently on observation. The patient is feeling fine today with no concerning complaints except for arthralgia. Repeat CBC today showed mild increase in the total white blood count compared to 6 months ago. I recommended for her to continue on observation with repeat CBC, comprehensive metabolic panel and LDH in 6 months. She was advised to call immediately if she has any concerning symptoms in the interval. The patient voices understanding of current disease status and treatment options and is in agreement with the current care plan. All questions were answered. The patient knows to call the clinic with any problems, questions or concerns. We can certainly see the patient much sooner if necessary.  Disclaimer: This note was dictated with voice recognition software. Similar sounding words can inadvertently be transcribed and may not be corrected upon review.

## 2019-12-31 ENCOUNTER — Telehealth: Payer: Self-pay | Admitting: Internal Medicine

## 2019-12-31 NOTE — Telephone Encounter (Signed)
Scheduled per los. Called and left msg. Mailed printout  °

## 2020-01-08 DIAGNOSIS — M9902 Segmental and somatic dysfunction of thoracic region: Secondary | ICD-10-CM | POA: Diagnosis not present

## 2020-01-08 DIAGNOSIS — S233XXA Sprain of ligaments of thoracic spine, initial encounter: Secondary | ICD-10-CM | POA: Diagnosis not present

## 2020-01-08 DIAGNOSIS — M47816 Spondylosis without myelopathy or radiculopathy, lumbar region: Secondary | ICD-10-CM | POA: Diagnosis not present

## 2020-01-08 DIAGNOSIS — M47812 Spondylosis without myelopathy or radiculopathy, cervical region: Secondary | ICD-10-CM | POA: Diagnosis not present

## 2020-01-08 DIAGNOSIS — M9901 Segmental and somatic dysfunction of cervical region: Secondary | ICD-10-CM | POA: Diagnosis not present

## 2020-01-08 DIAGNOSIS — M9903 Segmental and somatic dysfunction of lumbar region: Secondary | ICD-10-CM | POA: Diagnosis not present

## 2020-01-13 DIAGNOSIS — S233XXA Sprain of ligaments of thoracic spine, initial encounter: Secondary | ICD-10-CM | POA: Diagnosis not present

## 2020-01-13 DIAGNOSIS — M47812 Spondylosis without myelopathy or radiculopathy, cervical region: Secondary | ICD-10-CM | POA: Diagnosis not present

## 2020-01-13 DIAGNOSIS — M9903 Segmental and somatic dysfunction of lumbar region: Secondary | ICD-10-CM | POA: Diagnosis not present

## 2020-01-13 DIAGNOSIS — M9901 Segmental and somatic dysfunction of cervical region: Secondary | ICD-10-CM | POA: Diagnosis not present

## 2020-01-13 DIAGNOSIS — M9902 Segmental and somatic dysfunction of thoracic region: Secondary | ICD-10-CM | POA: Diagnosis not present

## 2020-01-13 DIAGNOSIS — M47816 Spondylosis without myelopathy or radiculopathy, lumbar region: Secondary | ICD-10-CM | POA: Diagnosis not present

## 2020-01-15 ENCOUNTER — Telehealth: Payer: Self-pay | Admitting: Physical Medicine and Rehabilitation

## 2020-01-15 ENCOUNTER — Telehealth: Payer: Self-pay | Admitting: Orthopaedic Surgery

## 2020-01-15 DIAGNOSIS — M9902 Segmental and somatic dysfunction of thoracic region: Secondary | ICD-10-CM | POA: Diagnosis not present

## 2020-01-15 DIAGNOSIS — M9903 Segmental and somatic dysfunction of lumbar region: Secondary | ICD-10-CM | POA: Diagnosis not present

## 2020-01-15 DIAGNOSIS — M47816 Spondylosis without myelopathy or radiculopathy, lumbar region: Secondary | ICD-10-CM | POA: Diagnosis not present

## 2020-01-15 DIAGNOSIS — M9901 Segmental and somatic dysfunction of cervical region: Secondary | ICD-10-CM | POA: Diagnosis not present

## 2020-01-15 DIAGNOSIS — M47812 Spondylosis without myelopathy or radiculopathy, cervical region: Secondary | ICD-10-CM | POA: Diagnosis not present

## 2020-01-15 DIAGNOSIS — S233XXA Sprain of ligaments of thoracic spine, initial encounter: Secondary | ICD-10-CM | POA: Diagnosis not present

## 2020-01-15 NOTE — Telephone Encounter (Signed)
Patient called stating that the Chiropractor she has been seeing advised her that her bursae is irritated and wanted to know if she could get something for it or does she have to see Dr. Erlinda Hong first.  [phone].  Thank you.

## 2020-01-15 NOTE — Telephone Encounter (Signed)
Please advise 

## 2020-01-15 NOTE — Telephone Encounter (Signed)
Need to See What Dr. Erlinda Hong would want her to do.

## 2020-01-15 NOTE — Telephone Encounter (Signed)
Sure i'm more than happy to see her in the office about it

## 2020-01-15 NOTE — Telephone Encounter (Signed)
Patient is asking to schedule a hip injection with you. She had a right hip injection by Dr. Junius Roads on 3/8 (during OV with Dr. Erlinda Hong).  Please advise.

## 2020-01-20 DIAGNOSIS — M47816 Spondylosis without myelopathy or radiculopathy, lumbar region: Secondary | ICD-10-CM | POA: Diagnosis not present

## 2020-01-20 DIAGNOSIS — M9903 Segmental and somatic dysfunction of lumbar region: Secondary | ICD-10-CM | POA: Diagnosis not present

## 2020-01-20 DIAGNOSIS — M47812 Spondylosis without myelopathy or radiculopathy, cervical region: Secondary | ICD-10-CM | POA: Diagnosis not present

## 2020-01-20 DIAGNOSIS — M9901 Segmental and somatic dysfunction of cervical region: Secondary | ICD-10-CM | POA: Diagnosis not present

## 2020-01-20 DIAGNOSIS — S233XXA Sprain of ligaments of thoracic spine, initial encounter: Secondary | ICD-10-CM | POA: Diagnosis not present

## 2020-01-20 DIAGNOSIS — M9902 Segmental and somatic dysfunction of thoracic region: Secondary | ICD-10-CM | POA: Diagnosis not present

## 2020-01-21 ENCOUNTER — Ambulatory Visit (INDEPENDENT_AMBULATORY_CARE_PROVIDER_SITE_OTHER): Payer: Medicare Other

## 2020-01-21 ENCOUNTER — Other Ambulatory Visit: Payer: Self-pay

## 2020-01-21 ENCOUNTER — Encounter: Payer: Self-pay | Admitting: Orthopaedic Surgery

## 2020-01-21 ENCOUNTER — Ambulatory Visit (INDEPENDENT_AMBULATORY_CARE_PROVIDER_SITE_OTHER): Payer: Medicare Other | Admitting: Orthopaedic Surgery

## 2020-01-21 DIAGNOSIS — G8929 Other chronic pain: Secondary | ICD-10-CM

## 2020-01-21 DIAGNOSIS — M25561 Pain in right knee: Secondary | ICD-10-CM

## 2020-01-21 DIAGNOSIS — M545 Low back pain, unspecified: Secondary | ICD-10-CM

## 2020-01-21 DIAGNOSIS — M1611 Unilateral primary osteoarthritis, right hip: Secondary | ICD-10-CM

## 2020-01-21 NOTE — Progress Notes (Signed)
Office Visit Note   Patient: Kimberly King           Date of Birth: 1951/05/12           MRN: [id number] Visit Date: 01/21/2020              Requested by: Kimberly Rossetti, MD [address] [place],   [address] PCP: Kimberly Rossetti, MD   Assessment & Plan: Visit Diagnoses:  1. Primary osteoarthritis of right hip   2. Chronic pain of right knee   3. Chronic midline low back pain without sciatica     Plan: Impression is end-stage right hip DJD.  Unfortunately her symptoms have rapidly progressed and she is no longer getting any relief from the cortisone injections.  She is having a lot of difficulty with ADLs and performing her job.  She is very frustrated by the fact that she was not able to delay a hip replacement until a year from now at which time she was going to move in with her daughter down in [place].  She will likely need to take short-term disability to recover from her hip replacement.  Rehab and recovery risks and benefits were all reviewed in detail today.  She will have a discussion with her family and get back in touch with [place] about how she wants to proceed. Total face to face encounter time was greater than 25 minutes and over half of this time was spent in counseling and/or coordination of care.  Follow-Up Instructions: Return if symptoms worsen or fail to improve.   Orders:  Orders Placed This Encounter  Procedures  . XR KNEE 3 VIEW RIGHT  . XR Lumbar Spine 2-3 Views   No orders of the defined types were placed in this encounter.     Procedures: No procedures performed   Clinical Data: No additional findings.   Subjective: Chief Complaint  Patient presents with  . Right Hip - Pain  . Lower Back - Pain  . Right Knee - Pain    Kimberly King returns today for follow-up of her right hip pain.  She is complaining of groin pain and deep hip pain that radiates down to the thigh and into the knee.  Denies any numbness and tingling.  The cortisone  injection done by Dr. Junius King on March 8 helped for couple weeks.  She is having a lot of problems with the hip and she is now had to walk with a cane.   Review of Systems  Constitutional: Negative.   HENT: Negative.   Eyes: Negative.   Respiratory: Negative.   Cardiovascular: Negative.   Endocrine: Negative.   Musculoskeletal: Negative.   Neurological: Negative.   Hematological: Negative.   Psychiatric/Behavioral: Negative.   All other systems reviewed and are negative.    Objective: Vital Signs: There were no vitals taken for this visit.  Physical Exam Vitals and nursing note reviewed.  Constitutional:      Appearance: She is well-developed.  Pulmonary:     Effort: Pulmonary effort is normal.  Skin:    General: Skin is warm.     Capillary Refill: Capillary refill takes less than 2 seconds.  Neurological:     Mental Status: She is alert and oriented to person, place, and time.  Psychiatric:        Behavior: Behavior normal.        Thought Content: Thought content normal.        Judgment: Judgment normal.  Ortho Exam Right hip exam shows continued limited range of motion with positive FADIR and Stinchfield sign.  Slight tenderness over the lateral hip. Specialty Comments:  No specialty comments available.  Imaging: XR KNEE 3 VIEW RIGHT  Result Date: 01/21/2020 Stable left total knee replacement.  Mild right knee osteoarthritis  XR Lumbar Spine 2-3 Views  Result Date: 01/21/2020 Advanced degenerative disc disease L5-S1.  Lumbar spondylosis.  No acute abnormalities.    PMFS History: Patient Active Problem List   Diagnosis Date Noted  . Chronic midline low back pain without sciatica 01/21/2020  . Chronic pain of right knee 01/21/2020  . Primary osteoarthritis of right hip 12/16/2019  . Situational mixed anxiety and depressive disorder 07/15/2019  . Osteopenia 03/15/2019  . DDD (degenerative disc disease), lumbar 11/12/2018  . HNP (herniated nucleus  pulposus), cervical 10/15/2018  . Mild hyperlipidemia 05/23/2018  . S/P TKR (total knee replacement), left 03/19/2018  . Obesity (BMI 30-39.9) 12/31/2017  . Primary localized osteoarthritis of knees, bilateral 12/05/2017  . Primary localized osteoarthritis of right hip 12/05/2017  . Migraines 07/01/2017  . Chronic neck and back pain 07/01/2017  . Thyroid nodule 06/30/2017  . GERD (gastroesophageal reflux disease) 06/30/2017  . Vaginal atrophy 06/30/2017  . Subclinical hyperthyroidism 12/06/2016  . CLL (chronic lymphocytic leukemia) ([place]) 12/17/2013  . COLONIC POLYPS, ADENOMATOUS 10/26/2007   Past Medical History:  Diagnosis Date  . Allergy   . Anxiety   . Arthritis    knees hips neck  . Cancer [facility])    leukemia dx 06/2013 - no current treatment - being monitored  . Cataract    bilateral - being monitored, no treatment currently  . CLL (chronic lymphocytic leukemia) ([place])   . Depression   . GERD (gastroesophageal reflux disease)   . Leukocytosis    and low blood sodium   . Migraines   . Thyroid nodule    JUST WATCHING    Family History  Problem Relation Age of Onset  . Colon cancer Paternal Grandmother 15  . Esophageal cancer Paternal Uncle   . Arthritis Mother   . Heart disease Father   . Lung cancer Father   . Lung cancer Maternal Grandfather   . Breast cancer Maternal Grandmother   . Cirrhosis Brother   . Rectal cancer Neg Hx   . Stomach cancer Neg Hx     Past Surgical History:  Procedure Laterality Date  . ANTERIOR CERVICAL DECOMP/DISCECTOMY FUSION N/A 10/15/2018   Procedure: PARTIAL REMOVAL OF ANTERIOR CERVICAL PLATE,ANTERIOR CERVICAL DECOMPRESSION/DISCECTOMY FUSION TWO  CERVICAL THREE- CERVICAL FOUR, CERVICAL FOUR- CERVICAL FIVE;  Surgeon: Kimberly Gamma, MD;  Location: [place];  Service: Neurosurgery;  Laterality: N/A;  PARTIAL REMOVAL OF ANTERIOR CERVICAL PLATE,ANTERIOR CERVICAL DECOMPRESSION/DISCECTOMY FUSION TWO  CERVICAL THREE- CERVICAL FOUR, CERVICAL   .  APPENDECTOMY    . BLADDER SURGERY     tack  . CERVICAL FUSION     x2  -  C5-C6  . COLONOSCOPY  2014   pyrtle - hx polyps  . DILATION AND CURETTAGE OF UTERUS     x 2  . JOINT REPLACEMENT    . knee     arthroscopy    left   07/2017  . KNEE SURGERY Left 2018  . TONSILLECTOMY    . TOTAL KNEE ARTHROPLASTY Left 03/19/2018   Procedure: LEFT TOTAL KNEE ARTHROPLASTY;  Surgeon: Leandrew Koyanagi, MD;  Location: [place];  Service: Orthopedics;  Laterality: Left;   Social History   Occupational History  .  Not on file  Tobacco Use  . Smoking status: Former Smoker    Packs/day: 0.25    Years: 30.00    Pack years: 7.50    Types: Cigarettes    Quit date: 04/22/2015    Years since quitting: 4.7  . Smokeless tobacco: Never Used  Substance and Sexual Activity  . Alcohol use: Yes    Comment: Occasional  . Drug use: No  . Sexual activity: Not on file

## 2020-01-22 DIAGNOSIS — H04123 Dry eye syndrome of bilateral lacrimal glands: Secondary | ICD-10-CM | POA: Diagnosis not present

## 2020-01-22 DIAGNOSIS — M9901 Segmental and somatic dysfunction of cervical region: Secondary | ICD-10-CM | POA: Diagnosis not present

## 2020-01-22 DIAGNOSIS — M47816 Spondylosis without myelopathy or radiculopathy, lumbar region: Secondary | ICD-10-CM | POA: Diagnosis not present

## 2020-01-22 DIAGNOSIS — S233XXA Sprain of ligaments of thoracic spine, initial encounter: Secondary | ICD-10-CM | POA: Diagnosis not present

## 2020-01-22 DIAGNOSIS — H2513 Age-related nuclear cataract, bilateral: Secondary | ICD-10-CM | POA: Diagnosis not present

## 2020-01-22 DIAGNOSIS — M47812 Spondylosis without myelopathy or radiculopathy, cervical region: Secondary | ICD-10-CM | POA: Diagnosis not present

## 2020-01-22 DIAGNOSIS — M9903 Segmental and somatic dysfunction of lumbar region: Secondary | ICD-10-CM | POA: Diagnosis not present

## 2020-01-22 DIAGNOSIS — H40013 Open angle with borderline findings, low risk, bilateral: Secondary | ICD-10-CM | POA: Diagnosis not present

## 2020-01-22 DIAGNOSIS — M9902 Segmental and somatic dysfunction of thoracic region: Secondary | ICD-10-CM | POA: Diagnosis not present

## 2020-01-22 DIAGNOSIS — H25013 Cortical age-related cataract, bilateral: Secondary | ICD-10-CM | POA: Diagnosis not present

## 2020-01-27 DIAGNOSIS — M9901 Segmental and somatic dysfunction of cervical region: Secondary | ICD-10-CM | POA: Diagnosis not present

## 2020-01-27 DIAGNOSIS — M9902 Segmental and somatic dysfunction of thoracic region: Secondary | ICD-10-CM | POA: Diagnosis not present

## 2020-01-27 DIAGNOSIS — S233XXA Sprain of ligaments of thoracic spine, initial encounter: Secondary | ICD-10-CM | POA: Diagnosis not present

## 2020-01-27 DIAGNOSIS — M47812 Spondylosis without myelopathy or radiculopathy, cervical region: Secondary | ICD-10-CM | POA: Diagnosis not present

## 2020-01-27 DIAGNOSIS — M47816 Spondylosis without myelopathy or radiculopathy, lumbar region: Secondary | ICD-10-CM | POA: Diagnosis not present

## 2020-01-27 DIAGNOSIS — M9903 Segmental and somatic dysfunction of lumbar region: Secondary | ICD-10-CM | POA: Diagnosis not present

## 2020-01-29 DIAGNOSIS — M9902 Segmental and somatic dysfunction of thoracic region: Secondary | ICD-10-CM | POA: Diagnosis not present

## 2020-01-29 DIAGNOSIS — M47812 Spondylosis without myelopathy or radiculopathy, cervical region: Secondary | ICD-10-CM | POA: Diagnosis not present

## 2020-01-29 DIAGNOSIS — M9901 Segmental and somatic dysfunction of cervical region: Secondary | ICD-10-CM | POA: Diagnosis not present

## 2020-01-29 DIAGNOSIS — M9903 Segmental and somatic dysfunction of lumbar region: Secondary | ICD-10-CM | POA: Diagnosis not present

## 2020-01-29 DIAGNOSIS — S233XXA Sprain of ligaments of thoracic spine, initial encounter: Secondary | ICD-10-CM | POA: Diagnosis not present

## 2020-01-29 DIAGNOSIS — M47816 Spondylosis without myelopathy or radiculopathy, lumbar region: Secondary | ICD-10-CM | POA: Diagnosis not present

## 2020-02-03 ENCOUNTER — Telehealth: Payer: Self-pay | Admitting: Orthopaedic Surgery

## 2020-02-03 ENCOUNTER — Other Ambulatory Visit: Payer: Self-pay

## 2020-02-03 ENCOUNTER — Other Ambulatory Visit: Payer: Self-pay | Admitting: Physician Assistant

## 2020-02-03 DIAGNOSIS — M9903 Segmental and somatic dysfunction of lumbar region: Secondary | ICD-10-CM | POA: Diagnosis not present

## 2020-02-03 DIAGNOSIS — M9902 Segmental and somatic dysfunction of thoracic region: Secondary | ICD-10-CM | POA: Diagnosis not present

## 2020-02-03 DIAGNOSIS — M47816 Spondylosis without myelopathy or radiculopathy, lumbar region: Secondary | ICD-10-CM | POA: Diagnosis not present

## 2020-02-03 DIAGNOSIS — M9901 Segmental and somatic dysfunction of cervical region: Secondary | ICD-10-CM | POA: Diagnosis not present

## 2020-02-03 DIAGNOSIS — M47812 Spondylosis without myelopathy or radiculopathy, cervical region: Secondary | ICD-10-CM | POA: Diagnosis not present

## 2020-02-03 DIAGNOSIS — S233XXA Sprain of ligaments of thoracic spine, initial encounter: Secondary | ICD-10-CM | POA: Diagnosis not present

## 2020-02-03 MED ORDER — AMOXICILLIN 500 MG PO TABS: ORAL_TABLET | ORAL | 0 refills | Status: DC

## 2020-02-03 NOTE — Telephone Encounter (Signed)
Ok to go ahead.  I have called in abx

## 2020-02-03 NOTE — Telephone Encounter (Signed)
Patient called requesting an RX for an antibiotic for dental work. She also wanted to know if she needs to wait until after her hip replacement or is it safe to go ahead and get the dental work done now.  CB#[phone].  Thank you.

## 2020-02-04 NOTE — Telephone Encounter (Signed)
Called patient. She is aware.   

## 2020-02-06 ENCOUNTER — Telehealth: Payer: Self-pay | Admitting: Orthopaedic Surgery

## 2020-02-06 ENCOUNTER — Telehealth: Payer: Self-pay

## 2020-02-06 DIAGNOSIS — M9902 Segmental and somatic dysfunction of thoracic region: Secondary | ICD-10-CM | POA: Diagnosis not present

## 2020-02-06 DIAGNOSIS — M47812 Spondylosis without myelopathy or radiculopathy, cervical region: Secondary | ICD-10-CM | POA: Diagnosis not present

## 2020-02-06 DIAGNOSIS — S233XXA Sprain of ligaments of thoracic spine, initial encounter: Secondary | ICD-10-CM | POA: Diagnosis not present

## 2020-02-06 DIAGNOSIS — M9903 Segmental and somatic dysfunction of lumbar region: Secondary | ICD-10-CM | POA: Diagnosis not present

## 2020-02-06 DIAGNOSIS — M47816 Spondylosis without myelopathy or radiculopathy, lumbar region: Secondary | ICD-10-CM | POA: Diagnosis not present

## 2020-02-06 DIAGNOSIS — M9901 Segmental and somatic dysfunction of cervical region: Secondary | ICD-10-CM | POA: Diagnosis not present

## 2020-02-06 NOTE — Telephone Encounter (Signed)
Patient would like to be OOW from  01/28/2020- up to su date (02/28/2020) and then be OOW 6-12 weeks after su.   Okay to write letter.

## 2020-02-06 NOTE — Telephone Encounter (Signed)
Please write letter stating that she has advanced and end stage DJD of her right hip which will need replacement.  She also has chronic back pain which will limit her ability to sit for periods of time.  Would anticipate 6-12 weeks out of work.  Thanks.

## 2020-02-06 NOTE — Telephone Encounter (Signed)
Note made.  

## 2020-02-06 NOTE — Telephone Encounter (Signed)
yes

## 2020-02-06 NOTE — Telephone Encounter (Signed)
Patient came by.   She has jury duty and needs a note detailing her condition and why she won't be able to attend jury duty. She is also requesting a call to discuss how long she will be out of work.   Call back: [phone]

## 2020-02-07 NOTE — Telephone Encounter (Signed)
Note made.  

## 2020-02-10 DIAGNOSIS — S233XXA Sprain of ligaments of thoracic spine, initial encounter: Secondary | ICD-10-CM | POA: Diagnosis not present

## 2020-02-10 DIAGNOSIS — M9902 Segmental and somatic dysfunction of thoracic region: Secondary | ICD-10-CM | POA: Diagnosis not present

## 2020-02-10 DIAGNOSIS — M9903 Segmental and somatic dysfunction of lumbar region: Secondary | ICD-10-CM | POA: Diagnosis not present

## 2020-02-10 DIAGNOSIS — M9901 Segmental and somatic dysfunction of cervical region: Secondary | ICD-10-CM | POA: Diagnosis not present

## 2020-02-10 DIAGNOSIS — M47816 Spondylosis without myelopathy or radiculopathy, lumbar region: Secondary | ICD-10-CM | POA: Diagnosis not present

## 2020-02-10 DIAGNOSIS — M47812 Spondylosis without myelopathy or radiculopathy, cervical region: Secondary | ICD-10-CM | POA: Diagnosis not present

## 2020-02-12 ENCOUNTER — Telehealth: Payer: Self-pay | Admitting: Family Medicine

## 2020-02-12 NOTE — Chronic Care Management (AMB) (Signed)
  Chronic Care Management   Outreach Note  02/12/2020 Name: Kimberly King MRN: [id number] DOB: Jan 29, 1951  Referred by: Alycia Rossetti, MD Reason for referral : Chronic Care Management   An unsuccessful telephone outreach was attempted today. The patient was referred to the pharmacist for assistance with care management and care coordination.   Follow Up Plan:   [place]

## 2020-02-13 DIAGNOSIS — M47816 Spondylosis without myelopathy or radiculopathy, lumbar region: Secondary | ICD-10-CM | POA: Diagnosis not present

## 2020-02-13 DIAGNOSIS — M9901 Segmental and somatic dysfunction of cervical region: Secondary | ICD-10-CM | POA: Diagnosis not present

## 2020-02-13 DIAGNOSIS — M9903 Segmental and somatic dysfunction of lumbar region: Secondary | ICD-10-CM | POA: Diagnosis not present

## 2020-02-13 DIAGNOSIS — S233XXA Sprain of ligaments of thoracic spine, initial encounter: Secondary | ICD-10-CM | POA: Diagnosis not present

## 2020-02-13 DIAGNOSIS — M9902 Segmental and somatic dysfunction of thoracic region: Secondary | ICD-10-CM | POA: Diagnosis not present

## 2020-02-13 DIAGNOSIS — M47812 Spondylosis without myelopathy or radiculopathy, cervical region: Secondary | ICD-10-CM | POA: Diagnosis not present

## 2020-02-18 NOTE — Progress Notes (Signed)
[id number] [address], [place] [address] Phone: [phone] Fax: [phone]      Your procedure is scheduled on Feb 28, 2020.  Report to [facility] Main Entrance "A" at 10:00 A.M., and check in at the Admitting office.  Call this number if you have problems the morning of surgery:  [phone]  Call [phone] if you have any questions prior to your surgery date Monday-Friday 8am-4pm    Remember:  Do not eat after midnight the night before your surgery  You may drink clear liquids until 9:00 the morning of your surgery.   Clear liquids allowed are: Water, Non-Citrus Juices (without pulp), Carbonated Beverages, Clear Tea, Black Coffee Only, and Gatorade  Please complete your PRE-SURGERY ENSURE that was provided to you by 9:00 the morning of surgery.  Please, if able, drink it in one setting. DO NOT SIP.    Take these medicines the morning of surgery with A SIP OF WATER: DULoxetine (CYMBALTA) pantoprazole (PROTONIX)  As Needed: acetaminophen (TYLENOL) diphenhydrAMINE-PE-APAP (QC SEVERE ALLERGY RELIEF SINUS) fluticasone (FLONASE) Polyethyl Glycol-Propyl Glycol (SYSTANE OP) SUMAtriptan (IMITREX) traMADol (ULTRAM)   As of today, STOP taking any Aspirin (unless otherwise instructed by your surgeon) and Aspirin containing products, Aleve, Naproxen, Ibuprofen, Motrin, Advil, Goody's, BC's, all herbal medications, fish oil, and all vitamins.                      Do not wear jewelry, make up, or nail polish            Do not wear lotions, powders, perfumes or deodorant.            Do not shave 48 hours prior to surgery.             Do not bring valuables to the hospital.            [facility] is not responsible for any belongings or valuables.  Do NOT Smoke (Tobacco/Vapping) or drink Alcohol 24 hours prior to your procedure If you use a CPAP at night, you may bring all equipment for your overnight stay.    Contacts, glasses, dentures or bridgework may not be worn into surgery.      For patients admitted to the hospital, discharge time will be determined by your treatment team.   Patients discharged the day of surgery will not be allowed to drive home, and someone needs to stay with them for 24 hours.    Special instructions:   [place]- Preparing For Surgery  Before surgery, you can play an important role. Because skin is not sterile, your skin needs to be as free of germs as possible. You can reduce the number of germs on your skin by washing with CHG (chlorahexidine gluconate) Soap before surgery.  CHG is an antiseptic cleaner which kills germs and bonds with the skin to continue killing germs even after washing.    Oral Hygiene is also important to reduce your risk of infection.  Remember - BRUSH YOUR TEETH THE MORNING OF SURGERY WITH YOUR REGULAR TOOTHPASTE  Please do not use if you have an allergy to CHG or antibacterial soaps. If your skin becomes reddened/irritated stop using the CHG.  Do not shave (including legs and underarms) for at least 48 hours prior to first CHG shower. It is OK to shave your face.  Please follow these instructions carefully.   1. Shower the NIGHT BEFORE SURGERY and the  MORNING OF SURGERY with CHG Soap.   2. If you chose to wash your hair, wash your hair first as usual with your normal shampoo.  3. After you shampoo, rinse your hair and body thoroughly to remove the shampoo.  4. Use CHG as you would any other liquid soap. You can apply CHG directly to the skin and wash gently with a scrungie or a clean washcloth.   5. Apply the CHG Soap to your body ONLY FROM THE NECK DOWN.  Do not use on open wounds or open sores. Avoid contact with your eyes, ears, mouth and genitals (private parts). Wash Face and genitals (private parts)  with your normal soap.   6. Wash thoroughly, paying special attention to the area where your surgery will be  performed.  7. Thoroughly rinse your body with warm water from the neck down.  8. DO NOT shower/wash with your normal soap after using and rinsing off the CHG Soap.  9. Pat yourself dry with a CLEAN TOWEL.  10. Wear CLEAN PAJAMAS to bed the night before surgery, wear comfortable clothes the morning of surgery  11. Place CLEAN SHEETS on your bed the night of your first shower and DO NOT SLEEP WITH PETS.   Day of Surgery:   Do not apply any deodorants/lotions.  Please wear clean clothes to the hospital/surgery center.   Remember to brush your teeth WITH YOUR REGULAR TOOTHPASTE.   Please read over the following fact sheets that you were given.

## 2020-02-19 ENCOUNTER — Encounter (HOSPITAL_COMMUNITY): Payer: Self-pay

## 2020-02-19 ENCOUNTER — Encounter (HOSPITAL_COMMUNITY)
Admission: RE | Admit: 2020-02-19 | Discharge: 2020-02-19 | Disposition: A | Discharge status: Home / Self Care | Payer: Medicare Other | Source: Ambulatory Visit | Attending: Orthopaedic Surgery | Admitting: Orthopaedic Surgery

## 2020-02-19 ENCOUNTER — Other Ambulatory Visit: Payer: Self-pay

## 2020-02-19 ENCOUNTER — Ambulatory Visit (HOSPITAL_COMMUNITY)
Admission: RE | Admit: 2020-02-19 | Discharge: 2020-02-19 | Disposition: A | Discharge status: Home / Self Care | Payer: Medicare Other | Source: Ambulatory Visit | Attending: Physician Assistant | Admitting: Physician Assistant

## 2020-02-19 DIAGNOSIS — M1611 Unilateral primary osteoarthritis, right hip: Secondary | ICD-10-CM

## 2020-02-19 DIAGNOSIS — Z01818 Encounter for other preprocedural examination: Secondary | ICD-10-CM | POA: Insufficient documentation

## 2020-02-19 LAB — APTT: aPTT: 29 seconds (ref 24–36)

## 2020-02-19 LAB — URINALYSIS, ROUTINE W REFLEX MICROSCOPIC
Bacteria, UA: NONE SEEN
Bilirubin Urine: NEGATIVE
Glucose, UA: NEGATIVE mg/dL
Hgb urine dipstick: NEGATIVE
Ketones, ur: NEGATIVE mg/dL
Nitrite: NEGATIVE
Protein, ur: 30 mg/dL — AB
Specific Gravity, Urine: 1.021 (ref 1.005–1.030)
pH: 5 (ref 5.0–8.0)

## 2020-02-19 LAB — CBC WITH DIFFERENTIAL/PLATELET
Abs Immature Granulocytes: 0.2 10*3/uL — ABNORMAL HIGH (ref 0.00–0.07)
Basophils Absolute: 0 10*3/uL (ref 0.0–0.1)
Basophils Relative: 0 %
Eosinophils Absolute: 0.2 10*3/uL (ref 0.0–0.5)
Eosinophils Relative: 1 %
HCT: 37.4 % (ref 36.0–46.0)
Hemoglobin: 11.3 g/dL — ABNORMAL LOW (ref 12.0–15.0)
Lymphocytes Relative: 55 %
Lymphs Abs: 10.1 10*3/uL — ABNORMAL HIGH (ref 0.7–4.0)
MCH: 26.3 pg (ref 26.0–34.0)
MCHC: 30.2 g/dL (ref 30.0–36.0)
MCV: 87.2 fL (ref 80.0–100.0)
Monocytes Absolute: 0.9 10*3/uL (ref 0.1–1.0)
Monocytes Relative: 5 %
Neutro Abs: 7 10*3/uL (ref 1.7–7.7)
Neutrophils Relative %: 38 %
Platelets: 437 10*3/uL — ABNORMAL HIGH (ref 150–400)
Promyelocytes Relative: 1 %
RBC: 4.29 MIL/uL (ref 3.87–5.11)
RDW: 16.6 % — ABNORMAL HIGH (ref 11.5–15.5)
WBC: 18.3 10*3/uL — ABNORMAL HIGH (ref 4.0–10.5)
nRBC: 0 % (ref 0.0–0.2)
nRBC: 0 /100 WBC

## 2020-02-19 LAB — TYPE AND SCREEN
ABO/RH(D): A POS
Antibody Screen: NEGATIVE

## 2020-02-19 LAB — COMPREHENSIVE METABOLIC PANEL
ALT: 23 U/L (ref 0–44)
AST: 21 U/L (ref 15–41)
Albumin: 4.3 g/dL (ref 3.5–5.0)
Alkaline Phosphatase: 105 U/L (ref 38–126)
Anion gap: 8 (ref 5–15)
BUN: 22 mg/dL (ref 8–23)
CO2: 27 mmol/L (ref 22–32)
Calcium: 9.6 mg/dL (ref 8.9–10.3)
Chloride: 102 mmol/L (ref 98–111)
Creatinine, Ser: 0.72 mg/dL (ref 0.44–1.00)
GFR calc Af Amer: >60 mL/min (ref >60)
GFR calc non Af Amer: >60 mL/min (ref >60)
Glucose, Bld: 105 mg/dL — ABNORMAL HIGH (ref 70–99)
Potassium: 3.9 mmol/L (ref 3.5–5.1)
Sodium: 137 mmol/L (ref 135–145)
Total Bilirubin: 0.7 mg/dL (ref 0.3–1.2)
Total Protein: 7 g/dL (ref 6.5–8.1)

## 2020-02-19 LAB — SURGICAL PCR SCREEN
MRSA, PCR: NEGATIVE
Staphylococcus aureus: NEGATIVE

## 2020-02-19 LAB — PROTIME-INR
INR: 1 (ref 0.8–1.2)
Prothrombin Time: 12.3 seconds (ref 11.4–15.2)

## 2020-02-19 NOTE — Progress Notes (Signed)
PCP - Buelah Manis, MD Cardiologist - pt denies   Chest x-ray - 02/19/20 EKG - 02/19/20 Stress Test - pt denies ECHO - pt denies Cardiac Cath - pt denies   Blood Thinner Instructions: n/a Aspirin Instructions:n/a  ERAS Protcol - yes PRE-SURGERY Ensure or G2- ensure  COVID TEST- 02/25/20  Coronavirus Screening  Have you experienced the following symptoms:  Cough yes/no: No Fever (>100.62F)  yes/no: No Runny nose yes/no: No Sore throat yes/no: No Difficulty breathing/shortness of breath  yes/no: No  Have you or a family member traveled in the last 14 days and where? yes/no: No   If the patient indicates "YES" to the above questions, their PAT will be rescheduled to limit the exposure to others and, the surgeon will be notified. THE PATIENT WILL NEED TO BE ASYMPTOMATIC FOR 14 DAYS.   If the patient is not experiencing any of these symptoms, the PAT nurse will instruct them to NOT bring anyone with them to their appointment since they may have these symptoms or traveled as well.   Please remind your patients and families that hospital visitation restrictions are in effect and the importance of the restrictions.     Anesthesia review: n/a  Patient denies shortness of breath, fever, cough and chest pain at PAT appointment   All instructions explained to the patient, with a verbal understanding of the material. Patient agrees to go over the instructions while at home for a better understanding. Patient also instructed to self quarantine after being tested for COVID-19. The opportunity to ask questions was provided.

## 2020-02-20 LAB — PATHOLOGIST SMEAR REVIEW

## 2020-02-25 ENCOUNTER — Other Ambulatory Visit (HOSPITAL_COMMUNITY)
Admission: RE | Admit: 2020-02-25 | Discharge: 2020-02-25 | Disposition: A | Discharge status: Home / Self Care | Payer: Medicare Other | Source: Ambulatory Visit | Attending: Orthopaedic Surgery | Admitting: Orthopaedic Surgery

## 2020-02-25 DIAGNOSIS — Z20822 Contact with and (suspected) exposure to covid-19: Secondary | ICD-10-CM | POA: Insufficient documentation

## 2020-02-25 DIAGNOSIS — Z01812 Encounter for preprocedural laboratory examination: Secondary | ICD-10-CM | POA: Diagnosis not present

## 2020-02-25 LAB — SARS CORONAVIRUS 2 (TAT 6-24 HRS): SARS Coronavirus 2: NEGATIVE

## 2020-02-26 ENCOUNTER — Telehealth: Payer: Self-pay | Admitting: *Deleted

## 2020-02-26 NOTE — Care Plan (Signed)
RNCM call to patient for pre-op call. Patient is an Ortho bundle patient through [facility] and will be having Right THA with Dr. Erlinda Hong on 02/28/20. She has a daughter that will be able to assist at her home after surgery. She has all DME needed. Anticipate HHPT will be needed after short hospital stay. Choice provided and she thinks she was previously with [facility] for her total knee replacement several years ago. Referral made to [facility]. Reviewed all pre- and post-op questions. Will continue to follow for needs.

## 2020-02-26 NOTE — Telephone Encounter (Signed)
Ortho bundle pre-op call completed. 

## 2020-02-27 MED ORDER — BUPIVACAINE LIPOSOME 1.3 % IJ SUSP
10.0000 mL | Freq: Once | INTRAMUSCULAR | Status: DC
  Filled 2020-02-27: qty 10

## 2020-02-27 MED ORDER — TRANEXAMIC ACID 1000 MG/10ML IV SOLN
2000.0000 mg | INTRAVENOUS | Status: DC
  Filled 2020-02-27 (×2): qty 20

## 2020-02-27 NOTE — Discharge Instructions (Signed)

## 2020-02-28 ENCOUNTER — Ambulatory Visit (HOSPITAL_COMMUNITY): Payer: Medicare Other | Admitting: Anesthesiology

## 2020-02-28 ENCOUNTER — Other Ambulatory Visit: Payer: Self-pay

## 2020-02-28 ENCOUNTER — Observation Stay (HOSPITAL_COMMUNITY)
Admission: RE | Admit: 2020-02-28 | Discharge: 2020-02-29 | Disposition: A | Discharge status: Home / Self Care | Payer: Medicare Other | Attending: Orthopaedic Surgery | Admitting: Orthopaedic Surgery

## 2020-02-28 ENCOUNTER — Encounter (HOSPITAL_COMMUNITY): Payer: Self-pay | Admitting: Orthopaedic Surgery

## 2020-02-28 ENCOUNTER — Encounter (HOSPITAL_COMMUNITY)
Admission: RE | Disposition: A | Discharge status: Home / Self Care | Payer: Self-pay | Source: Home / Self Care | Attending: Orthopaedic Surgery

## 2020-02-28 ENCOUNTER — Ambulatory Visit (HOSPITAL_COMMUNITY): Payer: Medicare Other

## 2020-02-28 ENCOUNTER — Observation Stay (HOSPITAL_COMMUNITY): Payer: Medicare Other

## 2020-02-28 DIAGNOSIS — K219 Gastro-esophageal reflux disease without esophagitis: Secondary | ICD-10-CM | POA: Diagnosis not present

## 2020-02-28 DIAGNOSIS — M1990 Unspecified osteoarthritis, unspecified site: Secondary | ICD-10-CM | POA: Diagnosis not present

## 2020-02-28 DIAGNOSIS — Z419 Encounter for procedure for purposes other than remedying health state, unspecified: Secondary | ICD-10-CM

## 2020-02-28 DIAGNOSIS — F329 Major depressive disorder, single episode, unspecified: Secondary | ICD-10-CM | POA: Insufficient documentation

## 2020-02-28 DIAGNOSIS — E0590 Thyrotoxicosis, unspecified without thyrotoxic crisis or storm: Secondary | ICD-10-CM | POA: Insufficient documentation

## 2020-02-28 DIAGNOSIS — Z791 Long term (current) use of non-steroidal anti-inflammatories (NSAID): Secondary | ICD-10-CM | POA: Diagnosis not present

## 2020-02-28 DIAGNOSIS — M87051 Idiopathic aseptic necrosis of right femur: Secondary | ICD-10-CM

## 2020-02-28 DIAGNOSIS — Z79899 Other long term (current) drug therapy: Secondary | ICD-10-CM | POA: Insufficient documentation

## 2020-02-28 DIAGNOSIS — Z96649 Presence of unspecified artificial hip joint: Secondary | ICD-10-CM

## 2020-02-28 DIAGNOSIS — Z96641 Presence of right artificial hip joint: Secondary | ICD-10-CM

## 2020-02-28 DIAGNOSIS — M1611 Unilateral primary osteoarthritis, right hip: Principal | ICD-10-CM

## 2020-02-28 DIAGNOSIS — Z87891 Personal history of nicotine dependence: Secondary | ICD-10-CM | POA: Insufficient documentation

## 2020-02-28 DIAGNOSIS — Z981 Arthrodesis status: Secondary | ICD-10-CM | POA: Diagnosis not present

## 2020-02-28 DIAGNOSIS — F419 Anxiety disorder, unspecified: Secondary | ICD-10-CM | POA: Diagnosis not present

## 2020-02-28 DIAGNOSIS — C9110 Chronic lymphocytic leukemia of B-cell type not having achieved remission: Secondary | ICD-10-CM | POA: Insufficient documentation

## 2020-02-28 DIAGNOSIS — E782 Mixed hyperlipidemia: Secondary | ICD-10-CM | POA: Diagnosis not present

## 2020-02-28 DIAGNOSIS — Z471 Aftercare following joint replacement surgery: Secondary | ICD-10-CM | POA: Diagnosis not present

## 2020-02-28 DIAGNOSIS — G43909 Migraine, unspecified, not intractable, without status migrainosus: Secondary | ICD-10-CM | POA: Diagnosis not present

## 2020-02-28 HISTORY — PX: TOTAL HIP ARTHROPLASTY: SHX124

## 2020-02-28 SURGERY — ARTHROPLASTY, HIP, TOTAL, ANTERIOR APPROACH
Anesthesia: Monitor Anesthesia Care | Site: Hip | Laterality: Right

## 2020-02-28 MED ORDER — HYDROMORPHONE HCL 1 MG/ML IJ SOLN
INTRAMUSCULAR | Status: AC
  Filled 2020-02-28: qty 1

## 2020-02-28 MED ORDER — OXYCODONE HCL ER 10 MG PO T12A
10.0000 mg | EXTENDED_RELEASE_TABLET | Freq: Two times a day (BID) | ORAL | Status: DC
  Administered 2020-02-28 – 2020-02-29 (×2): 10 mg via ORAL
  Filled 2020-02-28 (×2): qty 1

## 2020-02-28 MED ORDER — ASPIRIN 81 MG PO CHEW
81.0000 mg | CHEWABLE_TABLET | Freq: Two times a day (BID) | ORAL | Status: DC
  Administered 2020-02-28 – 2020-02-29 (×2): 81 mg via ORAL
  Filled 2020-02-28 (×2): qty 1

## 2020-02-28 MED ORDER — OXYCODONE HCL 5 MG/5ML PO SOLN: 5.0000 mg | Freq: Once | ORAL | Status: DC | PRN

## 2020-02-28 MED ORDER — ONDANSETRON HCL 4 MG/2ML IJ SOLN: 4.0000 mg | Freq: Once | INTRAMUSCULAR | Status: DC | PRN

## 2020-02-28 MED ORDER — ASPIRIN EC 81 MG PO TBEC
81.0000 mg | DELAYED_RELEASE_TABLET | Freq: Two times a day (BID) | ORAL | 0 refills | Status: DC
Start: 2020-02-28 — End: 2020-05-11

## 2020-02-28 MED ORDER — OXYCODONE HCL 5 MG PO TABS: 5.0000 mg | ORAL_TABLET | Freq: Once | ORAL | Status: DC | PRN

## 2020-02-28 MED ORDER — FLUTICASONE PROPIONATE 50 MCG/ACT NA SUSP: 1.0000 | Freq: Every day | NASAL | Status: DC | PRN

## 2020-02-28 MED ORDER — MAGNESIUM CITRATE PO SOLN: 1.0000 | Freq: Once | ORAL | Status: DC | PRN

## 2020-02-28 MED ORDER — DEXAMETHASONE SODIUM PHOSPHATE 10 MG/ML IJ SOLN
10.0000 mg | Freq: Once | INTRAMUSCULAR | Status: AC
  Administered 2020-02-29: 10 mg via INTRAVENOUS
  Filled 2020-02-28: qty 1

## 2020-02-28 MED ORDER — PROPOFOL 500 MG/50ML IV EMUL
INTRAVENOUS | Status: DC | PRN
  Administered 2020-02-28: 100 ug/kg/min via INTRAVENOUS

## 2020-02-28 MED ORDER — GABAPENTIN 300 MG PO CAPS
300.0000 mg | ORAL_CAPSULE | Freq: Three times a day (TID) | ORAL | Status: DC
  Administered 2020-02-28 – 2020-02-29 (×3): 300 mg via ORAL
  Filled 2020-02-28 (×3): qty 1

## 2020-02-28 MED ORDER — 0.9 % SODIUM CHLORIDE (POUR BTL) OPTIME
TOPICAL | Status: DC | PRN
  Administered 2020-02-28: 1000 mL

## 2020-02-28 MED ORDER — MIDAZOLAM HCL 2 MG/2ML IJ SOLN
INTRAMUSCULAR | Status: AC
  Filled 2020-02-28: qty 2

## 2020-02-28 MED ORDER — BUPIVACAINE IN DEXTROSE 0.75-8.25 % IT SOLN
INTRATHECAL | Status: DC | PRN
  Administered 2020-02-28: 1.8 mL via INTRATHECAL

## 2020-02-28 MED ORDER — HYDROMORPHONE HCL 1 MG/ML IJ SOLN
0.2500 mg | INTRAMUSCULAR | Status: DC | PRN
  Administered 2020-02-28 (×3): 0.5 mg via INTRAVENOUS

## 2020-02-28 MED ORDER — KETOROLAC TROMETHAMINE 15 MG/ML IJ SOLN
15.0000 mg | Freq: Four times a day (QID) | INTRAMUSCULAR | Status: AC
  Administered 2020-02-28 – 2020-02-29 (×4): 15 mg via INTRAVENOUS
  Filled 2020-02-28 (×4): qty 1

## 2020-02-28 MED ORDER — TRANEXAMIC ACID-NACL 1000-0.7 MG/100ML-% IV SOLN
INTRAVENOUS | Status: AC
  Filled 2020-02-28: qty 100

## 2020-02-28 MED ORDER — METHOCARBAMOL 1000 MG/10ML IJ SOLN
500.0000 mg | Freq: Four times a day (QID) | INTRAVENOUS | Status: DC | PRN
  Filled 2020-02-28: qty 5

## 2020-02-28 MED ORDER — ONDANSETRON HCL 4 MG/2ML IJ SOLN
INTRAMUSCULAR | Status: AC
  Filled 2020-02-28: qty 2

## 2020-02-28 MED ORDER — MENTHOL 3 MG MT LOZG: 1.0000 | LOZENGE | OROMUCOSAL | Status: DC | PRN

## 2020-02-28 MED ORDER — ACETAMINOPHEN 500 MG PO TABS
1000.0000 mg | ORAL_TABLET | Freq: Four times a day (QID) | ORAL | Status: DC
  Administered 2020-02-28 – 2020-02-29 (×3): 1000 mg via ORAL
  Filled 2020-02-28 (×4): qty 2

## 2020-02-28 MED ORDER — TRANEXAMIC ACID-NACL 1000-0.7 MG/100ML-% IV SOLN
1000.0000 mg | Freq: Once | INTRAVENOUS | Status: AC
  Administered 2020-02-28: 1000 mg via INTRAVENOUS
  Filled 2020-02-28: qty 100

## 2020-02-28 MED ORDER — METOCLOPRAMIDE HCL 5 MG/ML IJ SOLN: 5.0000 mg | Freq: Three times a day (TID) | INTRAMUSCULAR | Status: DC | PRN

## 2020-02-28 MED ORDER — VANCOMYCIN HCL 1 G IV SOLR
INTRAVENOUS | Status: DC | PRN
  Administered 2020-02-28: 1000 mg

## 2020-02-28 MED ORDER — CEFAZOLIN SODIUM-DEXTROSE 2-3 GM-%(50ML) IV SOLR
INTRAVENOUS | Status: DC | PRN
  Administered 2020-02-28: 2 g via INTRAVENOUS

## 2020-02-28 MED ORDER — MELATONIN 5 MG PO TABS: 5.0000 mg | ORAL_TABLET | Freq: Every evening | ORAL | Status: DC | PRN

## 2020-02-28 MED ORDER — PANTOPRAZOLE SODIUM 40 MG PO TBEC
40.0000 mg | DELAYED_RELEASE_TABLET | Freq: Every day | ORAL | Status: DC
  Administered 2020-02-29: 40 mg via ORAL
  Filled 2020-02-28: qty 1

## 2020-02-28 MED ORDER — SODIUM CHLORIDE 0.9 % IR SOLN
Status: DC | PRN
  Administered 2020-02-28: 3000 mL

## 2020-02-28 MED ORDER — OXYCODONE HCL 5 MG PO TABS
10.0000 mg | ORAL_TABLET | ORAL | Status: DC | PRN
  Administered 2020-02-29: 15 mg via ORAL
  Administered 2020-02-29: 10 mg via ORAL
  Filled 2020-02-28: qty 3

## 2020-02-28 MED ORDER — TRANEXAMIC ACID-NACL 1000-0.7 MG/100ML-% IV SOLN
INTRAVENOUS | Status: DC | PRN
  Administered 2020-02-28: 1000 mg via INTRAVENOUS

## 2020-02-28 MED ORDER — METHOCARBAMOL 500 MG PO TABS
750.0000 mg | ORAL_TABLET | Freq: Four times a day (QID) | ORAL | Status: DC | PRN
  Administered 2020-02-28: 750 mg via ORAL
  Filled 2020-02-28: qty 2

## 2020-02-28 MED ORDER — ONDANSETRON HCL 4 MG PO TABS: 4.0000 mg | ORAL_TABLET | Freq: Three times a day (TID) | ORAL | 0 refills | Status: DC | PRN

## 2020-02-28 MED ORDER — CEFAZOLIN SODIUM-DEXTROSE 2-4 GM/100ML-% IV SOLN
INTRAVENOUS | Status: AC
  Administered 2020-02-28: 2000 mg
  Filled 2020-02-28: qty 100

## 2020-02-28 MED ORDER — DOCUSATE SODIUM 100 MG PO CAPS
100.0000 mg | ORAL_CAPSULE | Freq: Two times a day (BID) | ORAL | Status: DC
  Administered 2020-02-28 – 2020-02-29 (×2): 100 mg via ORAL
  Filled 2020-02-28 (×2): qty 1

## 2020-02-28 MED ORDER — BUPIVACAINE HCL (PF) 0.25 % IJ SOLN
INTRAMUSCULAR | Status: DC | PRN
Start: 2020-02-28 — End: 2020-02-28
  Administered 2020-02-28: 20 mL

## 2020-02-28 MED ORDER — CHLORHEXIDINE GLUCONATE 0.12 % MT SOLN
15.0000 mL | Freq: Once | OROMUCOSAL | Status: DC
  Filled 2020-02-28: qty 15

## 2020-02-28 MED ORDER — OXYCODONE-ACETAMINOPHEN 5-325 MG PO TABS: 1.0000 | ORAL_TABLET | Freq: Three times a day (TID) | ORAL | 0 refills | Status: DC | PRN

## 2020-02-28 MED ORDER — HYDROMORPHONE HCL 1 MG/ML IJ SOLN
0.5000 mg | INTRAMUSCULAR | Status: DC | PRN
  Administered 2020-02-28: 0.5 mg via INTRAVENOUS
  Filled 2020-02-28: qty 1

## 2020-02-28 MED ORDER — LACTATED RINGERS IV SOLN: INTRAVENOUS | Status: DC

## 2020-02-28 MED ORDER — ONDANSETRON HCL 4 MG/2ML IJ SOLN: 4.0000 mg | Freq: Four times a day (QID) | INTRAMUSCULAR | Status: DC | PRN

## 2020-02-28 MED ORDER — ACETAMINOPHEN 325 MG PO TABS: 325.0000 mg | ORAL_TABLET | Freq: Four times a day (QID) | ORAL | Status: DC | PRN

## 2020-02-28 MED ORDER — ONDANSETRON HCL 4 MG PO TABS: 4.0000 mg | ORAL_TABLET | Freq: Four times a day (QID) | ORAL | Status: DC | PRN

## 2020-02-28 MED ORDER — SUMATRIPTAN SUCCINATE 50 MG PO TABS
50.0000 mg | ORAL_TABLET | ORAL | Status: DC | PRN
  Filled 2020-02-28: qty 1

## 2020-02-28 MED ORDER — BUPIVACAINE HCL (PF) 0.25 % IJ SOLN
INTRAMUSCULAR | Status: AC
  Filled 2020-02-28: qty 30

## 2020-02-28 MED ORDER — OXYCODONE HCL 5 MG PO TABS
5.0000 mg | ORAL_TABLET | ORAL | Status: DC | PRN
  Administered 2020-02-29: 10 mg via ORAL
  Filled 2020-02-28 (×2): qty 2

## 2020-02-28 MED ORDER — POLYETHYLENE GLYCOL 3350 17 G PO PACK: 17.0000 g | PACK | Freq: Every day | ORAL | Status: DC | PRN

## 2020-02-28 MED ORDER — ACETAMINOPHEN 500 MG PO TABS
1000.0000 mg | ORAL_TABLET | Freq: Once | ORAL | Status: DC
  Filled 2020-02-28: qty 2

## 2020-02-28 MED ORDER — ORAL CARE MOUTH RINSE: 15.0000 mL | Freq: Once | OROMUCOSAL | Status: DC

## 2020-02-28 MED ORDER — MIDAZOLAM HCL 5 MG/5ML IJ SOLN
INTRAMUSCULAR | Status: DC | PRN
  Administered 2020-02-28: 2 mg via INTRAVENOUS

## 2020-02-28 MED ORDER — TRANEXAMIC ACID 1000 MG/10ML IV SOLN
INTRAVENOUS | Status: DC | PRN
  Administered 2020-02-28: 2000 mg via TOPICAL

## 2020-02-28 MED ORDER — METHOCARBAMOL 750 MG PO TABS: 750.0000 mg | ORAL_TABLET | Freq: Two times a day (BID) | ORAL | 3 refills | Status: AC | PRN

## 2020-02-28 MED ORDER — SODIUM CHLORIDE 0.9 % IV SOLN
INTRAVENOUS | Status: DC | PRN
  Administered 2020-02-28: 40 mL

## 2020-02-28 MED ORDER — PHENYLEPHRINE HCL-NACL 10-0.9 MG/250ML-% IV SOLN
INTRAVENOUS | Status: DC | PRN
  Administered 2020-02-28: 30 ug/min via INTRAVENOUS

## 2020-02-28 MED ORDER — SODIUM CHLORIDE 0.9 % IV SOLN: INTRAVENOUS | Status: DC

## 2020-02-28 MED ORDER — VANCOMYCIN HCL 1000 MG IV SOLR
INTRAVENOUS | Status: AC
  Filled 2020-02-28: qty 1000

## 2020-02-28 MED ORDER — FENTANYL CITRATE (PF) 250 MCG/5ML IJ SOLN
INTRAMUSCULAR | Status: AC
  Filled 2020-02-28: qty 5

## 2020-02-28 MED ORDER — PHENOL 1.4 % MT LIQD: 1.0000 | OROMUCOSAL | Status: DC | PRN

## 2020-02-28 MED ORDER — SORBITOL 70 % SOLN: 30.0000 mL | Freq: Every day | Status: DC | PRN

## 2020-02-28 MED ORDER — METOCLOPRAMIDE HCL 5 MG PO TABS: 5.0000 mg | ORAL_TABLET | Freq: Three times a day (TID) | ORAL | Status: DC | PRN

## 2020-02-28 MED ORDER — ALUM & MAG HYDROXIDE-SIMETH 200-200-20 MG/5ML PO SUSP: 30.0000 mL | ORAL | Status: DC | PRN

## 2020-02-28 MED ORDER — SODIUM CHLORIDE 0.9% FLUSH
INTRAVENOUS | Status: DC | PRN
  Administered 2020-02-28 (×2): 10 mL

## 2020-02-28 MED ORDER — CEFAZOLIN SODIUM-DEXTROSE 2-4 GM/100ML-% IV SOLN
2.0000 g | Freq: Four times a day (QID) | INTRAVENOUS | Status: AC
  Administered 2020-02-28 – 2020-02-29 (×3): 2 g via INTRAVENOUS
  Filled 2020-02-28 (×3): qty 100

## 2020-02-28 MED ORDER — DIPHENHYDRAMINE HCL 12.5 MG/5ML PO ELIX: 25.0000 mg | ORAL_SOLUTION | ORAL | Status: DC | PRN

## 2020-02-28 MED ORDER — DULOXETINE HCL 30 MG PO CPEP
30.0000 mg | ORAL_CAPSULE | Freq: Every day | ORAL | Status: DC
  Administered 2020-02-29: 30 mg via ORAL
  Filled 2020-02-28: qty 1

## 2020-02-28 SURGICAL SUPPLY — 65 items
ACETAB CUP W GRIPTION 54MM (Plate) ×1 IMPLANT
ACETAB CUP W/GRIPTION 54 (Plate) ×2 IMPLANT
ADH SKN CLS APL DERMABOND .7 (GAUZE/BANDAGES/DRESSINGS) ×1
BAG DECANTER FOR FLEXI CONT (MISCELLANEOUS) ×3 IMPLANT
CELLS DAT CNTRL 66122 CELL SVR (MISCELLANEOUS) ×1 IMPLANT
COVER PERINEAL POST (MISCELLANEOUS) ×3 IMPLANT
COVER SURGICAL LIGHT HANDLE (MISCELLANEOUS) ×3 IMPLANT
COVER WAND RF STERILE (DRAPES) ×3 IMPLANT
CUP ACETAB W/GRIPTION 54 (Plate) IMPLANT
DERMABOND ADVANCED (GAUZE/BANDAGES/DRESSINGS) ×2
DERMABOND ADVANCED .7 DNX12 (GAUZE/BANDAGES/DRESSINGS) IMPLANT
DRAPE C-ARM 42X72 X-RAY (DRAPES) ×3 IMPLANT
DRAPE POUCH INSTRU U-SHP 10X18 (DRAPES) ×3 IMPLANT
DRAPE STERI IOBAN 125X83 (DRAPES) ×3 IMPLANT
DRAPE U-SHAPE 47X51 STRL (DRAPES) ×6 IMPLANT
DRSG AQUACEL AG ADV 3.5X10 (GAUZE/BANDAGES/DRESSINGS) ×3 IMPLANT
DURAPREP 26ML APPLICATOR (WOUND CARE) ×6 IMPLANT
ELECT BLADE 4.0 EZ CLEAN MEGAD (MISCELLANEOUS) ×3
ELECT REM PT RETURN 9FT ADLT (ELECTROSURGICAL) ×3
ELECTRODE BLDE 4.0 EZ CLN MEGD (MISCELLANEOUS) ×1 IMPLANT
ELECTRODE REM PT RTRN 9FT ADLT (ELECTROSURGICAL) ×1 IMPLANT
GLOVE BIOGEL PI IND STRL 7.0 (GLOVE) ×1 IMPLANT
GLOVE BIOGEL PI INDICATOR 7.0 (GLOVE) ×2
GLOVE ECLIPSE 7.0 STRL STRAW (GLOVE) ×6 IMPLANT
GLOVE SKINSENSE NS SZ7.5 (GLOVE) ×2
GLOVE SKINSENSE STRL SZ7.5 (GLOVE) ×1 IMPLANT
GLOVE SURG SYN 7.5  E (GLOVE) ×12
GLOVE SURG SYN 7.5 E (GLOVE) ×4 IMPLANT
GLOVE SURG SYN 7.5 PF PI (GLOVE) ×4 IMPLANT
GOWN STRL REIN XL XLG (GOWN DISPOSABLE) ×3 IMPLANT
GOWN STRL REUS W/ TWL LRG LVL3 (GOWN DISPOSABLE) IMPLANT
GOWN STRL REUS W/ TWL XL LVL3 (GOWN DISPOSABLE) ×1 IMPLANT
GOWN STRL REUS W/TWL LRG LVL3 (GOWN DISPOSABLE)
GOWN STRL REUS W/TWL XL LVL3 (GOWN DISPOSABLE) ×3
HANDPIECE INTERPULSE COAX TIP (DISPOSABLE) ×3
HEAD CERAMIC 36 PLUS5 (Hips) ×2 IMPLANT
HOOD PEEL AWAY FLYTE STAYCOOL (MISCELLANEOUS) ×6 IMPLANT
IV NS IRRIG 3000ML ARTHROMATIC (IV SOLUTION) ×3 IMPLANT
KIT BASIN OR (CUSTOM PROCEDURE TRAY) ×3 IMPLANT
LINER NEUTRAL 54X36MM PLUS 4 (Hips) ×2 IMPLANT
MARKER SKIN DUAL TIP RULER LAB (MISCELLANEOUS) ×3 IMPLANT
NDL SPNL 18GX3.5 QUINCKE PK (NEEDLE) ×1 IMPLANT
NEEDLE SPNL 18GX3.5 QUINCKE PK (NEEDLE) ×3 IMPLANT
PACK TOTAL JOINT (CUSTOM PROCEDURE TRAY) ×3 IMPLANT
PACK UNIVERSAL I (CUSTOM PROCEDURE TRAY) ×3 IMPLANT
RETRACTOR WND ALEXIS 18 MED (MISCELLANEOUS) IMPLANT
RTRCTR WOUND ALEXIS 18CM MED (MISCELLANEOUS) ×3
SAW OSC TIP CART 19.5X105X1.3 (SAW) ×3 IMPLANT
SCREW 6.5MMX25MM (Screw) ×2 IMPLANT
SET HNDPC FAN SPRY TIP SCT (DISPOSABLE) ×1 IMPLANT
STAPLER VISISTAT 35W (STAPLE) IMPLANT
STEM FEM ACTIS STD SZ4 (Stem) ×2 IMPLANT
SUT ETHIBOND 2 V 37 (SUTURE) ×3 IMPLANT
SUT VIC AB 0 CT1 27 (SUTURE) ×3
SUT VIC AB 0 CT1 27XBRD ANBCTR (SUTURE) ×1 IMPLANT
SUT VIC AB 1 CTX 36 (SUTURE) ×3
SUT VIC AB 1 CTX36XBRD ANBCTR (SUTURE) ×1 IMPLANT
SUT VIC AB 2-0 CT1 27 (SUTURE) ×6
SUT VIC AB 2-0 CT1 TAPERPNT 27 (SUTURE) ×2 IMPLANT
SYR 50ML LL SCALE MARK (SYRINGE) ×3 IMPLANT
TOWEL GREEN STERILE (TOWEL DISPOSABLE) ×3 IMPLANT
TRAY CATH 16FR W/PLASTIC CATH (SET/KITS/TRAYS/PACK) IMPLANT
TRAY FOLEY W/BAG SLVR 16FR (SET/KITS/TRAYS/PACK) ×3
TRAY FOLEY W/BAG SLVR 16FR ST (SET/KITS/TRAYS/PACK) ×1 IMPLANT
YANKAUER SUCT BULB TIP NO VENT (SUCTIONS) ×3 IMPLANT

## 2020-02-28 NOTE — Progress Notes (Signed)
   Subjective:  Patient reports pain as moderate.  No events.  Objective:   VITALS:   Vitals:   02/28/20 1545 02/28/20 1615 02/28/20 1640 02/28/20 1700  BP: 129/72 140/82 127/79 136/75  Pulse: 67 72 69 71  Resp: 11 15 14 15   Temp:   97.8 F (36.6 C) 98.6 F (37 C)  TempSrc:    Oral  SpO2: 95% 97% 98% 97%  Weight:      Height:        Neurovascular intact Sensation intact distally Intact pulses distally Dorsiflexion/Plantar flexion intact Incision: dressing C/D/I and no drainage No cellulitis present Compartment soft  Leg lengths equal   Lab Results  Component Value Date   WBC 18.3 (H) 02/19/2020   HGB 11.3 (L) 02/19/2020   HCT 37.4 02/19/2020   MCV 87.2 02/19/2020   PLT 437 (H) 02/19/2020     Assessment/Plan:  Day of Surgery   - Expected postop acute blood loss anemia - will monitor for symptoms - Up with PT/OT - DVT ppx - SCDs, ambulation, aspirin - WBAT operative extremity - patient may d/c home sat after she clears PT - daughter has already picked up Rx for patient  Eduard Roux 02/28/2020, 6:41 PM [phone]

## 2020-02-28 NOTE — Anesthesia Preprocedure Evaluation (Addendum)
Anesthesia Evaluation  Patient identified by MRN, date of birth, ID band Patient awake    Reviewed: Allergy & Precautions, NPO status , Patient's Chart, lab work & pertinent test results  Airway Mallampati: II  TM Distance: >3 FB Neck ROM: Full    Dental  (+) Teeth Intact, Dental Advisory Given   Pulmonary former smoker,  Quit smoking 2016, 8 pack year history    breath sounds clear to auscultation       Cardiovascular negative cardio ROS   Rhythm:Regular Rate:Normal     Neuro/Psych  Headaches, PSYCHIATRIC DISORDERS Anxiety Depression    GI/Hepatic Neg liver ROS, GERD  Controlled and Medicated,  Endo/Other  Hyperthyroidism   Renal/GU negative Renal ROS  negative genitourinary   Musculoskeletal  (+) Arthritis , Osteoarthritis,  R hip DJD   Abdominal Normal abdominal exam  (+)   Peds  Hematology plt 437, hct 37.4 Hx CLL   Anesthesia Other Findings   Reproductive/Obstetrics negative OB ROS                            Anesthesia Physical Anesthesia Plan  ASA: II  Anesthesia Plan: Spinal and MAC   Post-op Pain Management:    Induction:   PONV Risk Score and Plan: 2 and Propofol infusion and TIVA  Airway Management Planned: Natural Airway and Nasal Cannula  Additional Equipment: None  Intra-op Plan:   Post-operative Plan:   Informed Consent: I have reviewed the patients History and Physical, chart, labs and discussed the procedure including the risks, benefits and alternatives for the proposed anesthesia with the patient or authorized representative who has indicated his/her understanding and acceptance.       Plan Discussed with: CRNA  Anesthesia Plan Comments:         Anesthesia Quick Evaluation

## 2020-02-28 NOTE — Transfer of Care (Signed)
Immediate Anesthesia Transfer of Care Note  Patient: Kimberly King  Procedure(s) Performed: RIGHT TOTAL HIP ARTHROPLASTY ANTERIOR APPROACH (Right Hip)  Patient Location: PACU  Anesthesia Type:MAC  Level of Consciousness: awake, alert  and oriented  Airway & Oxygen Therapy: Patient Spontanous Breathing  Post-op Assessment: Report given to RN and Post -op Vital signs reviewed and stable  Post vital signs: Reviewed and stable  Last Vitals:  Vitals Value Taken Time  BP 111/60 02/28/20 1346  Temp    Pulse 69 02/28/20 1347  Resp 17 02/28/20 1347  SpO2 95 % 02/28/20 1347  Vitals shown include unvalidated device data.  Last Pain:  Vitals:   02/28/20 1026  TempSrc: Tympanic         Complications: No apparent anesthesia complications

## 2020-02-28 NOTE — Op Note (Signed)
RIGHT TOTAL HIP ARTHROPLASTY ANTERIOR APPROACH  Procedure Note Kimberly King   [id number]  Pre-op Diagnosis: degenerative joint disease right hip     Post-op Diagnosis: avascular necrosis of right hip with degenerative joint disease   Operative Procedures  1. Total hip replacement; Right hip; uncemented [id number]   Personnel  Surgeon(s): Leandrew Koyanagi, MD  Assist: April Green, RNFA   Anesthesia: spinal  Prosthesis: Depuy Acetabulum: Pinnacle 54 mm Femur: Actis 4 STD Head: 36 mm size: +5 Liner: +4 neutral Bearing Type: ceramic on poly  Total Hip Arthroplasty (Anterior Approach) Op Note:  After informed consent was obtained and the operative extremity marked in the holding area, the patient was brought back to the operating room and placed supine on the HANA table. Next, the operative extremity was prepped and draped in normal sterile fashion. Surgical timeout occurred verifying patient identification, surgical site, surgical procedure and administration of antibiotics. Preoperative fluoro was consistent with a significant clinical leg length discrepancy.   A modified anterior Smith-Peterson approach to the hip was performed, using the interval between tensor fascia lata and sartorius.  Dissection was carried bluntly down onto the anterior hip capsule. The lateral femoral circumflex vessels were identified and coagulated. A capsulotomy was performed and the capsular flaps tagged for later repair.  The femoral head was severely misshapen consistent with AVN.  The bone quality was mushy.  The neck osteotomy was performed. The femoral head was removed in pieces, the acetabular rim was cleared of soft tissue and calcified labrum and attention was turned to reaming the acetabulum.  Sequential reaming was performed under fluoroscopic guidance. We reamed to a size 53 mm, and then impacted the acetabular shell and a 25 mm cancellous screw was placed through the cup. The liner was then placed  after irrigation and attention turned to the femur.  After placing the femoral hook, the leg was taken to externally rotated, extended and adducted position taking care to perform soft tissue releases to allow for adequate mobilization of the femur. Soft tissue was cleared from the shoulder of the greater trochanter and the hook elevator used to improve exposure of the proximal femur. Sequential broaching performed up to a size 4. Trial neck and head were placed. The leg was brought back up to neutral and the construct reduced. The position and sizing of components, offset and leg lengths were checked using fluoroscopy. Shuck test was normal.  Stability of the construct was checked in extension and external rotation without any subluxation or impingement of prosthesis. We dislocated the prosthesis, dropped the leg back into position, removed trial components, and irrigated copiously. The final stem and a +5 ceramic head was then placed, the leg brought back up, the system reduced and fluoroscopy used to verify positioning.  We irrigated, obtained hemostasis and closed the capsule using #2 ethibond suture.  One gram of vancomycin powder was placed in the surgical bed. The fascia was closed with #1 vicryl plus, the deep fat layer was closed with 0 vicryl, the subcutaneous layers closed with 2.0 Vicryl Plus and the skin closed with 2.0 nylon and dermabond. A sterile dressing was applied. The patient was awakened in the operating room and taken to recovery in stable condition.  All sponge, needle, and instrument counts were correct at the end of the case.   Position: supine  Complications: see description of procedure.  Time Out: performed   Drains/Packing: none  Estimated blood loss: see anesthesia record  Returned to Recovery Room:  in good condition.   Antibiotics: yes   Mechanical VTE (DVT) Prophylaxis: sequential compression devices, TED thigh-high  Chemical VTE (DVT) Prophylaxis: aspirin   Fluid  Replacement: see anesthesia record  Specimens Removed: 1 to pathology   Sponge and Instrument Count Correct? yes   PACU: portable radiograph - low AP   Plan/RTC: Return in 2 weeks for staple removal. Weight Bearing/Load Lower Extremity: full  Hip precautions: none Suture Removal: 2 weeks   N. Eduard Roux, MD Marga Hoots 1:18 PM   Implant Name Type Inv. Item Serial No. Manufacturer Lot No. LRB No. Used Action  ACETABULAR CUP Daisy Blossom 54MM - [id number] Plate ACETABULAR CUP W GRIPTION 54MM  DEPUY SYN[employer]ES [id number] Right 1 Implanted  LINER NEUTRAL 54X36MM PLUS 4 - [id number] Hips LINER NEUTRAL 54X36MM PLUS 4  DEPUY SYNTHES [id number] Right 1 Implanted  SCREW 6.5MMX25MM - [id number] Screw SCREW 6.5MMX25MM  DEPUY SYN[employer]ES [id number] Right 1 Implanted  STEM FEM ACTIS STD SZ4 - [id number] Stem STEM FEM ACTIS STD SZ4  DEPUY SYNTHES [id number] Right 1 Implanted  HEAD CERAMIC 36 PLUS5 - [id number] Hips HEAD CERAMIC 36 PLUS5  DEPUY SYNTHES [id number] Right 1 Implanted

## 2020-02-28 NOTE — H&P (Signed)
PREOPERATIVE H&P  Chief Complaint: endstage degenerative joint disease right hip  HPI: Kimberly King is a 69 y.o. female who presents for surgical treatment of endstage degenerative joint disease right hip.  She denies any changes in medical history.  Past Medical History:  Diagnosis Date  . Allergy   . Anxiety   . Arthritis    knees hips neck  . Cancer [facility])    leukemia dx 06/2013 - no current treatment - being monitored  . Cataract    bilateral - being monitored, no treatment currently  . CLL (chronic lymphocytic leukemia) ([place])   . Depression   . GERD (gastroesophageal reflux disease)   . Leukocytosis    and low blood sodium   . Migraines   . Thyroid nodule    JUST WATCHING   Past Surgical History:  Procedure Laterality Date  . ANTERIOR CERVICAL DECOMP/DISCECTOMY FUSION N/A 10/15/2018   Procedure: PARTIAL REMOVAL OF ANTERIOR CERVICAL PLATE,ANTERIOR CERVICAL DECOMPRESSION/DISCECTOMY FUSION TWO  CERVICAL THREE- CERVICAL FOUR, CERVICAL FOUR- CERVICAL FIVE;  Surgeon: Jovita Gamma, MD;  Location: [place];  Service: Neurosurgery;  Laterality: N/A;  PARTIAL REMOVAL OF ANTERIOR CERVICAL PLATE,ANTERIOR CERVICAL DECOMPRESSION/DISCECTOMY FUSION TWO  CERVICAL THREE- CERVICAL FOUR, CERVICAL   . APPENDECTOMY    . BLADDER SURGERY     tack  . CERVICAL FUSION     x2  -  C5-C6  . COLONOSCOPY  2014   pyrtle - hx polyps  . DILATION AND CURETTAGE OF UTERUS     x 2  . JOINT REPLACEMENT    . knee     arthroscopy    left   07/2017  . KNEE SURGERY Left 2018  . TONSILLECTOMY    . TOTAL KNEE ARTHROPLASTY Left 03/19/2018   Procedure: LEFT TOTAL KNEE ARTHROPLASTY;  Surgeon: Leandrew Koyanagi, MD;  Location: [place];  Service: Orthopedics;  Laterality: Left;   Social History   Socioeconomic History  . Marital status: Divorced    Spouse name: Not on file  . Number of children: Not on file  . Years of education: Not on file  . Highest education level: Not on file  Occupational History  .  Not on file  Tobacco Use  . Smoking status: Former Smoker    Packs/day: 0.25    Years: 30.00    Pack years: 7.50    Types: Cigarettes    Quit date: 04/22/2015    Years since quitting: 4.8  . Smokeless tobacco: Never Used  Substance and Sexual Activity  . Alcohol use: Yes    Comment: Occasional  . Drug use: No  . Sexual activity: Not on file  Other Topics Concern  . Not on file  Social History Narrative  . Not on file   Social Determinants of Health   Financial Resource Strain:   . Difficulty of Paying Living Expenses:   Food Insecurity:   . Worried About Charity fundraiser in the Last Year:   . Arboriculturist in the Last Year:   Transportation Needs:   . Film/video editor (Medical):   Marland Kitchen Lack of Transportation (Non-Medical):   Physical Activity:   . Days of Exercise per Week:   . Minutes of Exercise per Session:   Stress:   . Feeling of Stress :   Social Connections:   . Frequency of Communication with Friends and Family:   . Frequency of Social Gatherings with Friends and Family:   . Attends Religious Services:   .  Active Member of Clubs or Organizations:   . Attends Archivist Meetings:   Marland Kitchen Marital Status:    Family History  Problem Relation Age of Onset  . Colon cancer Paternal Grandmother 29  . Esophageal cancer Paternal Uncle   . Arthritis Mother   . Heart disease Father   . Lung cancer Father   . Lung cancer Maternal Grandfather   . Breast cancer Maternal Grandmother   . Cirrhosis Brother   . Rectal cancer Neg Hx   . Stomach cancer Neg Hx    No Known Allergies Prior to Admission medications   Medication Sig Start Date End Date Taking? Authorizing Provider  acetaminophen (TYLENOL) 500 MG tablet Take 1,000 mg by mouth 3 (three) times daily as needed for moderate pain.   Yes [provider]  alendronate (FOSAMAX) 70 MG tablet TAKE 1 TABLET (70 MG TOTAL) BY MOUTH EVERY 7 (SEVEN) DAYS. TAKE WITH A FULL GLASS OF WATER ON AN EMPTY  STOMACH. Patient taking differently: Take 70 mg by mouth every Monday. Take with a full glass of water on an empty stomach. 09/04/19  Yes [place], Modena Nunnery, MD  aluminum-magnesium hydroxide-simethicone (MAALOX) 200-200-20 MG/5ML SUSP Take 15 mLs by mouth daily as needed (indigestion).   Yes [provider]  amoxicillin (AMOXIL) 500 MG tablet Take 4 tabs one hour prior to dental work Patient taking differently: Take 2,000 mg by mouth See admin instructions. Take 2000 mg one hour prior to dental work 02/03/20  Yes Aundra Dubin, PA-C  ASPERCREME LIDOCAINE EX Apply 1 application topically daily as needed (pain).   Yes [provider]  Calcium Carb-Cholecalciferol (CALCIUM 600 + D PO) Take 1 tablet by mouth daily.   Yes [provider]  Cholecalciferol (VITAMIN D3) 125 MCG (5000 UT) CAPS Take 5,000 Units by mouth 2 (two) times daily.    Yes [provider]  DENTA 5000 PLUS 1.1 % CREA dental cream Place 1 application onto teeth at bedtime. brush teeth for 2 minutes and spit out do not rinse 11/21/17  Yes [provider]  diclofenac (VOLTAREN) 75 MG EC tablet Take 75 mg by mouth daily. 04/10/18  Yes [provider]  diphenhydrAMINE-PE-APAP (QC SEVERE ALLERGY RELIEF SINUS) 25-5-325 MG TABS Take 1 tablet by mouth daily as needed (allergies).   Yes [provider]  DULoxetine (CYMBALTA) 30 MG capsule Take 30 mg by mouth daily. 12/22/19  Yes [provider]  famotidine (PEPCID) 20 MG tablet TAKE 1 TABLET BY MOUTH TWICE A DAY Patient taking differently: Take 20 mg by mouth at bedtime.  08/06/19  Yes [place], Modena Nunnery, MD  fluticasone [facility]) 50 MCG/ACT nasal spray Place 1 spray into both nostrils daily as needed for allergies or rhinitis.    Yes [provider]  Ginger, Zingiber officinalis, (GINGER ROOT PO) Take 1,500 mg by mouth daily.   Yes [provider]  Melatonin 5 MG CAPS Take 5 mg by mouth at bedtime as needed  (sleep).   Yes [provider]  Misc Natural Products (GLUCOSAMINE CHONDROITIN TRIPLE) TABS Take 2 tablets by mouth daily.   Yes [provider]  Misc Natural Products (TART CHERRY ADVANCED) CAPS Take 1 capsule by mouth daily.    Yes [provider]  Multiple Vitamins-Minerals (ICAPS PO) Take 1 tablet by mouth daily.   Yes [provider]  Omega-3 Fatty Acids (FISH OIL) 1200 MG CAPS Take 1,200 mg by mouth 2 (two) times a week.   Yes  [provider]  pantoprazole (PROTONIX) 40 MG tablet TAKE 1 TABLET BY MOUTH EVERY DAY Patient taking differently: Take 40 mg by mouth daily.  06/12/19  Yes [place], Modena Nunnery, MD  Polyethyl Glycol-Propyl Glycol (SYSTANE OP) Place 1 drop into both eyes 2 (two) times daily as needed (for dry eyes).    Yes [provider]  SUMAtriptan (IMITREX) 50 MG tablet Take 50 mg by mouth every 2 (two) hours as needed for migraine.    Yes [provider]  traMADol (ULTRAM) 50 MG tablet TAKE 1 TABLET BY MOUTH 2 TIMES DAILY AS NEEDED FOR MODERATE PAIN. Patient taking differently: Take 50 mg by mouth 2 (two) times daily.  12/10/19  Yes [place], Modena Nunnery, MD  TURMERIC CURCUMIN PO Take 2,600 mg by mouth daily.    Yes [provider]     Positive ROS: All other systems have been reviewed and were otherwise negative with the exception of those mentioned in the HPI and as above.  Physical Exam: General: Alert, no acute distress Cardiovascular: No pedal edema Respiratory: No cyanosis, no use of accessory musculature GI: abdomen soft Skin: No lesions in the area of chief complaint Neurologic: Sensation intact distally Psychiatric: Patient is competent for consent with normal mood and affect Lymphatic: no lymphedema  MUSCULOSKELETAL: exam stable  Assessment: endstage degenerative joint disease right hip  Plan: Plan for Procedure(s): RIGHT TOTAL HIP ARTHROPLASTY ANTERIOR APPROACH  The risks benefits and  alternatives were discussed with the patient including but not limited to the risks of nonoperative treatment, versus surgical intervention including infection, bleeding, nerve injury,  blood clots, cardiopulmonary complications, morbidity, mortality, among others, and they were willing to proceed.   Preoperative templating of the joint replacement has been completed, documented, and submitted to the Operating Room personnel in order to optimize intra-operative equipment management.   Eduard Roux, MD 02/28/2020 7:09 AM

## 2020-02-28 NOTE — Care Plan (Signed)
Ortho Bundle Case Management Note  Patient Details  Name: Kimberly King MRN: [id number] Date of Birth: Nov 07, 1950  [facility] call to patient for pre-op call. Patient is an Ortho bundle patient through [facility] and will be having Right THA with Dr. Erlinda Hong on 02/28/20. She has a daughter that will be able to assist at her home after surgery. She has all DME needed. Anticipate HHPT will be needed after short hospital stay. Choice provided and she thinks she was previously with [facility] for her total knee replacement several years ago. Referral made to [facility]. Reviewed all pre- and post-op questions. Will continue to follow for needs.                 DME Arranged:  (Patient verbalized she has all DME needed for post-surgery at home already.) DME Agency:     [place]:  PT [place]:  [facility] (now [facility])  Additional Comments: Please contact me with any questions of if this plan should need to change.  Jamse Arn, RN, BSN, [employer]  [phone] 02/28/2020, 3:32 PM

## 2020-02-28 NOTE — Anesthesia Procedure Notes (Signed)
Procedure Name: MAC Date/Time: 02/28/2020 11:45 AM Performed by: Griffin Dakin, CRNA Pre-anesthesia Checklist: Patient identified, Emergency Drugs available, Suction available and Patient being monitored Patient Re-evaluated:Patient Re-evaluated prior to induction Oxygen Delivery Method: Simple face mask Induction Type: IV induction Placement Confirmation: positive ETCO2 and breath sounds checked- equal and bilateral Tube secured with: Tape Dental Injury: Teeth and Oropharynx as per pre-operative assessment

## 2020-02-28 NOTE — Anesthesia Procedure Notes (Signed)
Spinal  Patient location during procedure: OR Start time: 02/28/2020 11:43 AM End time: 02/28/2020 11:46 AM Staffing Performed: anesthesiologist  Anesthesiologist: Catalina Gravel, MD Preanesthetic Checklist Completed: patient identified, IV checked, risks and benefits discussed, surgical consent, monitors and equipment checked, pre-op evaluation and timeout performed Spinal Block Patient position: sitting Prep: DuraPrep and site prepped and draped Patient monitoring: continuous pulse ox and blood pressure Approach: midline Location: L3-4 Injection technique: single-shot Needle Needle type: Pencan  Needle gauge: 24 G Assessment Sensory level: T6 Additional Notes Functioning IV was confirmed and monitors were applied. Sterile prep and drape, including hand hygiene, mask and sterile gloves were used. The patient was positioned and the spine was prepped. The skin was anesthetized with lidocaine.  Free flow of clear CSF was obtained prior to injecting local anesthetic into the CSF.  The spinal needle aspirated freely following injection.  The needle was carefully withdrawn.  The patient tolerated the procedure well. Consent was obtained prior to procedure with all questions answered and concerns addressed. Risks including but not limited to bleeding, infection, nerve damage, paralysis, failed block, inadequate analgesia, allergic reaction, high spinal, itching and headache were discussed and the patient wished to proceed.   Hoy Morn, MD

## 2020-02-29 DIAGNOSIS — K219 Gastro-esophageal reflux disease without esophagitis: Secondary | ICD-10-CM | POA: Diagnosis not present

## 2020-02-29 DIAGNOSIS — M1990 Unspecified osteoarthritis, unspecified site: Secondary | ICD-10-CM | POA: Diagnosis not present

## 2020-02-29 DIAGNOSIS — F329 Major depressive disorder, single episode, unspecified: Secondary | ICD-10-CM | POA: Diagnosis not present

## 2020-02-29 DIAGNOSIS — C9110 Chronic lymphocytic leukemia of B-cell type not having achieved remission: Secondary | ICD-10-CM | POA: Diagnosis not present

## 2020-02-29 DIAGNOSIS — M1611 Unilateral primary osteoarthritis, right hip: Secondary | ICD-10-CM | POA: Diagnosis not present

## 2020-02-29 DIAGNOSIS — F419 Anxiety disorder, unspecified: Secondary | ICD-10-CM | POA: Diagnosis not present

## 2020-02-29 LAB — CBC
HCT: 30.1 % — ABNORMAL LOW (ref 36.0–46.0)
Hemoglobin: 9.1 g/dL — ABNORMAL LOW (ref 12.0–15.0)
MCH: 26.4 pg (ref 26.0–34.0)
MCHC: 30.2 g/dL (ref 30.0–36.0)
MCV: 87.2 fL (ref 80.0–100.0)
Platelets: 353 10*3/uL (ref 150–400)
RBC: 3.45 MIL/uL — ABNORMAL LOW (ref 3.87–5.11)
RDW: 16.4 % — ABNORMAL HIGH (ref 11.5–15.5)
WBC: 15.4 10*3/uL — ABNORMAL HIGH (ref 4.0–10.5)
nRBC: 0 % (ref 0.0–0.2)

## 2020-02-29 LAB — BASIC METABOLIC PANEL
Anion gap: 6 (ref 5–15)
BUN: 15 mg/dL (ref 8–23)
CO2: 27 mmol/L (ref 22–32)
Calcium: 8.4 mg/dL — ABNORMAL LOW (ref 8.9–10.3)
Chloride: 103 mmol/L (ref 98–111)
Creatinine, Ser: 0.7 mg/dL (ref 0.44–1.00)
GFR calc Af Amer: >60 mL/min (ref >60)
GFR calc non Af Amer: >60 mL/min (ref >60)
Glucose, Bld: 96 mg/dL (ref 70–99)
Potassium: 3.8 mmol/L (ref 3.5–5.1)
Sodium: 136 mmol/L (ref 135–145)

## 2020-02-29 NOTE — Progress Notes (Signed)
Physical Therapy Evaluation Patient Details Name: Kimberly King MRN: [id number] DOB: 1951/10/05 Today's Date: 02/29/2020   History of Present Illness  Pt is a 69 y.o. F with significant PMH of CLL, L TKA, cervical fusion who presents s/p right total hip arthroplasty, direct anterior approach 02/28/2020.  Clinical Impression  Session focused on continued instruction of HEP, gait training and stair training. Pt ambulating 30 feet with a walker at a supervision level (distance not a focus this session). Negotiated 2 platform steps with a walker to simulate home set up. Education provided regarding frequency of exercise program and progression in addition to generalized walking program and appropriate DME.     Follow Up Recommendations Home health PT;Supervision - Intermittent    Equipment Recommendations  None recommended by PT    Recommendations for Other Services       Precautions / Restrictions Precautions Precautions: None Restrictions Weight Bearing Restrictions: No      Mobility  Bed Mobility               General bed mobility comments: OOB in chair  Transfers Overall transfer level: Modified independent Equipment used: Rolling walker (2 wheeled)             General transfer comment: Good power up to standing  Ambulation/Gait Ambulation/Gait assistance: Supervision Gait Distance (Feet): 30 Feet Assistive device: Rolling walker (2 wheeled) Gait Pattern/deviations: Step-through pattern;Decreased stance time - right;Decreased weight shift to right     General Gait Details: Cues for decreased right step length, good step through pattern.   Stairs Stairs: Yes Stairs assistance: Supervision Stair Management: No rails;With walker Number of Stairs: 2 General stair comments: Instruction/cueing for walker use, sequencing  Wheelchair Mobility    Modified Rankin (Stroke Patients Only)       Balance Overall balance assessment: No apparent balance  deficits (not formally assessed)                                           Pertinent Vitals/Pain Pain Assessment: Faces Faces Pain Scale: Hurts a little bit Pain Location: R hip, anterior thigh Pain Descriptors / Indicators: Grimacing;Guarding;Sore Pain Intervention(s): Monitored during session;Patient requesting pain meds-RN notified    Home Living                        Prior Function                 Hand Dominance        Extremity/Trunk Assessment                Communication      Cognition Arousal/Alertness: Awake/alert Behavior During Therapy: WFL for tasks assessed/performed Overall Cognitive Status: Within Functional Limits for tasks assessed                                        General Comments      Exercises General Exercises - Lower Extremity Long Arc Quad: Right;10 reps;Seated   Assessment/Plan    PT Assessment    PT Problem List         PT Treatment Interventions      PT Goals (Current goals can be found in the Care Plan section)  Acute Rehab PT Goals Patient Stated Goal: "return to  work." PT Goal Formulation: With patient Time For Goal Achievement: 03/14/20 Potential to Achieve Goals: Good    Frequency 7X/week   Barriers to discharge        Co-evaluation               AM-PAC PT "6 Clicks" Mobility  Outcome Measure Help needed turning from your back to your side while in a flat bed without using bedrails?: None Help needed moving from lying on your back to sitting on the side of a flat bed without using bedrails?: None Help needed moving to and from a bed to a chair (including a wheelchair)?: None Help needed standing up from a chair using your arms (e.g., wheelchair or bedside chair)?: None Help needed to walk in hospital room?: None Help needed climbing 3-5 steps with a railing? : A Little 6 Click Score: 23    End of Session   Activity Tolerance: Patient tolerated  treatment well Patient left: in chair;with call bell/phone within reach Nurse Communication: Mobility status PT Visit Diagnosis: Pain;Difficulty in walking, not elsewhere classified (R26.2) Pain - Right/Left: Right Pain - part of body: Hip    Time: [id number] PT Time Calculation (min) (ACUTE ONLY): 16 min   Charges:     PT Treatments $Gait Training: 8-22 mins          Kimberly King, PT, DPT Acute Rehabilitation Services Pager [phone] Office [phone]   Kimberly King 02/29/2020, 5:20 PM

## 2020-02-29 NOTE — Progress Notes (Addendum)
   Subjective: 1 Day Post-Op Procedure(s) (LRB): RIGHT TOTAL HIP ARTHROPLASTY ANTERIOR APPROACH (Right) Patient reports pain as mild and moderate.    Objective: Vital signs in last 24 hours: Temp:  [97.7 F (36.5 C)-100 F (37.8 C)] 99.4 F (37.4 C) (05/22 0920) Pulse Rate:  [64-93] 93 (05/22 0920) Resp:  [11-24] 16 (05/22 0920) BP: (100-160)/(44-84) 107/66 (05/22 0920) SpO2:  [93 %-99 %] 96 % (05/22 0920)  Intake/Output from previous day: 05/21 0701 - 05/22 0700 In: 700 [I.V.:600; IV Piggyback:100] Out: 1500 [Urine:1400; Blood:100] Intake/Output this shift: No intake/output data recorded.  Recent Labs    02/29/20 0327  HGB 9.1*   Recent Labs    02/29/20 0327  WBC 15.4*  RBC 3.45*  HCT 30.1*  PLT 353   Recent Labs    02/29/20 0327  NA 136  K 3.8  CL 103  CO2 27  BUN 15  CREATININE 0.70  GLUCOSE 96  CALCIUM 8.4*   No results for input(s): LABPT, INR in the last 72 hours.  Neurologically intact DG Pelvis Portable  Result Date: 02/28/2020 CLINICAL DATA:  Status post right hip total arthroplasty EXAM: PORTABLE PELVIS 1-2 VIEWS COMPARISON:  02/28/2020 FINDINGS: Interval postoperative findings of right hip total arthroplasty with expected overlying postoperative changes. No evidence of perihardware fracture or component malposition. IMPRESSION: Interval postoperative findings of right hip total arthroplasty. No evidence of hardware complication. Electronically Signed   By: Eddie Candle M.D.   On: 02/28/2020 14:42   DG C-Arm 1-60 Min  Result Date: 02/28/2020 CLINICAL DATA:  Status post total hip arthroplasty EXAM: DG C-ARM 1-60 MIN; OPERATIVE RIGHT HIP WITH PELVIS FLUOROSCOPY TIME:  Fluoroscopy Time:  0 minutes 29 seconds Number of Acquired Spot Images: 5 COMPARISON:  December 16, 2019 FINDINGS: Initial image shows marked joint space narrowing and bony hypertrophy in the right hip joint region. Milder narrowing noted left hip joint. Subsequent images show total hip  replacement on the right with prosthetic components well-seated on frontal view. No fracture or dislocation. IMPRESSION: Advanced arthropathy in the right hip joint with subsequent total hip replacement on the right. Prosthetic components appear well-seated on frontal view. No fracture or dislocation. Moderate narrowing left hip joint. Electronically Signed   By: Lowella Grip III M.D.   On: 02/28/2020 13:32   DG HIP OPERATIVE UNILAT W OR W/O PELVIS RIGHT  Result Date: 02/28/2020 CLINICAL DATA:  Status post total hip arthroplasty EXAM: DG C-ARM 1-60 MIN; OPERATIVE RIGHT HIP WITH PELVIS FLUOROSCOPY TIME:  Fluoroscopy Time:  0 minutes 29 seconds Number of Acquired Spot Images: 5 COMPARISON:  December 16, 2019 FINDINGS: Initial image shows marked joint space narrowing and bony hypertrophy in the right hip joint region. Milder narrowing noted left hip joint. Subsequent images show total hip replacement on the right with prosthetic components well-seated on frontal view. No fracture or dislocation. IMPRESSION: Advanced arthropathy in the right hip joint with subsequent total hip replacement on the right. Prosthetic components appear well-seated on frontal view. No fracture or dislocation. Moderate narrowing left hip joint. Electronically Signed   By: Lowella Grip III M.D.   On: 02/28/2020 13:32    Assessment/Plan: 1 Day Post-Op Procedure(s) (LRB): RIGHT TOTAL HIP ARTHROPLASTY ANTERIOR APPROACH (Right) Up with therapy, walked in halls. Steps this afternoon then home.   Kimberly King 02/29/2020, 10:29 AM

## 2020-02-29 NOTE — Anesthesia Postprocedure Evaluation (Signed)
Anesthesia Post Note  Patient: Kimberly King  Procedure(s) Performed: RIGHT TOTAL HIP ARTHROPLASTY ANTERIOR APPROACH (Right Hip)     Patient location during evaluation: PACU Anesthesia Type: Spinal Level of consciousness: oriented, awake and alert and awake Pain management: pain level controlled Vital Signs Assessment: post-procedure vital signs reviewed and stable Respiratory status: spontaneous breathing, respiratory function stable, patient connected to nasal cannula oxygen and nonlabored ventilation Cardiovascular status: blood pressure returned to baseline and stable Postop Assessment: no headache, no backache, no apparent nausea or vomiting, patient able to bend at knees and spinal receding Anesthetic complications: no    Last Vitals:  Vitals:   02/29/20 0431 02/29/20 0920  BP: 122/65 107/66  Pulse: 85 93  Resp: 16 16  Temp: 36.8 C 37.4 C  SpO2: 98% 96%    Last Pain:  Vitals:   02/29/20 0920  TempSrc: Oral  PainSc:                  Catalina Gravel

## 2020-02-29 NOTE — Plan of Care (Signed)
  Problem: Education: Goal: Knowledge of General Education information will improve Description: Including pain rating scale, medication(s)/side effects and non-pharmacologic comfort measures Outcome: Progressing   Problem: Pain Managment: Goal: General experience of comfort will improve Outcome: Progressing   Problem: Safety: Goal: Ability to remain free from injury will improve Outcome: Progressing   

## 2020-02-29 NOTE — Evaluation (Addendum)
Physical Therapy Evaluation Patient Details Name: Kimberly King MRN: [id number] DOB: [birth year] Today's Date: 02/29/2020   History of Present Illness  Pt is a 69 y.o. F with significant PMH of CLL, L TKA, cervical fusion who presents s/p right total hip arthroplasty, direct anterior approach 02/28/2020.  Clinical Impression  Pt admitted s/p procedure listed above. Prior to admission, she lives alone and works as a Biomedical scientist at [employer]. She reports her daughter will be able to provide necessary assist initially upon return home. On PT evaluation, pt demonstrating excellent pain control and activity tolerance. She is very pleasant and motivated to participate in therapy. Ambulating 100 feet with a walker at a supervision level. Initiated HEP program for RLE strengthening and provided written handout. Suspect good progress with therapy in a short amount of time. Will trial a platform step this afternoon session (pt has 1 step to enter her house).     Follow Up Recommendations Home health PT;Supervision - Intermittent    Equipment Recommendations  None recommended by PT (pt well equipped)   Recommendations for Other Services       Precautions / Restrictions Precautions Precautions: None Restrictions Weight Bearing Restrictions: No      Mobility  Bed Mobility Overal bed mobility: Modified Independent             General bed mobility comments: HOB elevated, increased time to progress RLE off edge of bed  Transfers Overall transfer level: Modified independent Equipment used: Rolling walker (2 wheeled)             General transfer comment: Good power up to standing  Ambulation/Gait Ambulation/Gait assistance: Supervision Gait Distance (Feet): 100 Feet Assistive device: Rolling walker (2 wheeled) Gait Pattern/deviations: Step-through pattern;Decreased stance time - right;Decreased weight shift to right     General Gait Details: Min cues for sequencing, supervision for  line management  Stairs            Wheelchair Mobility    Modified Rankin (Stroke Patients Only)       Balance Overall balance assessment: No apparent balance deficits (not formally assessed)                                           Pertinent Vitals/Pain Pain Assessment: Faces Faces Pain Scale: Hurts a little bit Pain Location: R hip, anterior thigh Pain Descriptors / Indicators: Grimacing;Guarding;Sore Pain Intervention(s): Monitored during session    Home Living Family/patient expects to be discharged to:: Private residence Living Arrangements: Alone Available Help at Discharge: Family;Available PRN/intermittently(daughter, cousin) Type of Home: House Home Access: Other (comment)(one threshold)     Home Layout: One level Home Equipment: Walker - 2 wheels;Cane - single point;Bedside commode;Shower seat;Other (comment)(step stool to get into bed )      Prior Function Level of Independence: Independent         Comments: Chef at [employer]        Extremity/Trunk Assessment   Upper Extremity Assessment Upper Extremity Assessment: Overall WFL for tasks assessed    Lower Extremity Assessment Lower Extremity Assessment: RLE deficits/detail RLE Deficits / Details: Expected post op deficits. Able to perform limited SLR and heel slide    Cervical / Trunk Assessment Cervical / Trunk Assessment: Normal  Communication   Communication: No difficulties  Cognition Arousal/Alertness: Awake/alert Behavior During Therapy: WFL for tasks assessed/performed Overall  Cognitive Status: Within Functional Limits for tasks assessed                                        General Comments      Exercises General Exercises - Lower Extremity Quad Sets: Both;10 reps;Supine Heel Slides: Right;10 reps;Supine Hip ABduction/ADduction: Right;10 reps;Supine Straight Leg Raises: Right;5 reps;Supine   Assessment/Plan     PT Assessment Patient needs continued PT services  PT Problem List Decreased strength;Decreased mobility;Pain       PT Treatment Interventions DME instruction;Gait training;Stair training;Functional mobility training;Therapeutic activities;Therapeutic exercise;Balance training;Patient/family education    PT Goals (Current goals can be found in the Care Plan section)  Acute Rehab PT Goals Patient Stated Goal: "return to work." PT Goal Formulation: With patient Time For Goal Achievement: 03/14/20 Potential to Achieve Goals: Good    Frequency 7X/week   Barriers to discharge        Co-evaluation               AM-PAC PT "6 Clicks" Mobility  Outcome Measure Help needed turning from your back to your side while in a flat bed without using bedrails?: None Help needed moving from lying on your back to sitting on the side of a flat bed without using bedrails?: None Help needed moving to and from a bed to a chair (including a wheelchair)?: None Help needed standing up from a chair using your arms (e.g., wheelchair or bedside chair)?: None Help needed to walk in hospital room?: None Help needed climbing 3-5 steps with a railing? : A Little 6 Click Score: 23    End of Session   Activity Tolerance: Patient tolerated treatment well Patient left: in chair;with call bell/phone within reach Nurse Communication: Mobility status PT Visit Diagnosis: Pain;Difficulty in walking, not elsewhere classified (R26.2) Pain - Right/Left: Right Pain - part of body: Hip    Time: 0805-0829 PT Time Calculation (min) (ACUTE ONLY): 24 min   Charges:   PT Evaluation $PT Eval Low Complexity: 1 Low PT Treatments $Gait Training: 8-22 mins          Wyona Almas, PT, DPT Acute Rehabilitation Services Pager [phone] Office [phone]   Deno Etienne 02/29/2020, 8:46 AM

## 2020-02-29 NOTE — Plan of Care (Signed)
  Problem: Education: Goal: Knowledge of General Education information will improve Description: Including pain rating scale, medication(s)/side effects and non-pharmacologic comfort measures 02/29/2020 1611 by Melina Schools, RN Outcome: Adequate for Discharge 02/29/2020 1011 by Melina Schools, RN Outcome: Progressing   Problem: Health Behavior/Discharge Planning: Goal: Ability to manage health-related needs will improve Outcome: Adequate for Discharge   Problem: Clinical Measurements: Goal: Ability to maintain clinical measurements within normal limits will improve Outcome: Adequate for Discharge Goal: Will remain free from infection Outcome: Adequate for Discharge Goal: Diagnostic test results will improve Outcome: Adequate for Discharge Goal: Respiratory complications will improve Outcome: Adequate for Discharge Goal: Cardiovascular complication will be avoided Outcome: Adequate for Discharge   Problem: Activity: Goal: Risk for activity intolerance will decrease Outcome: Adequate for Discharge   Problem: Nutrition: Goal: Adequate nutrition will be maintained Outcome: Adequate for Discharge   Problem: Coping: Goal: Level of anxiety will decrease Outcome: Adequate for Discharge   Problem: Elimination: Goal: Will not experience complications related to bowel motility Outcome: Adequate for Discharge Goal: Will not experience complications related to urinary retention Outcome: Adequate for Discharge   Problem: Pain Managment: Goal: General experience of comfort will improve 02/29/2020 1611 by Melina Schools, RN Outcome: Adequate for Discharge 02/29/2020 1011 by Melina Schools, RN Outcome: Progressing   Problem: Safety: Goal: Ability to remain free from injury will improve 02/29/2020 1611 by Melina Schools, RN Outcome: Adequate for Discharge 02/29/2020 1011 by Melina Schools, RN Outcome: Progressing   Problem: Skin Integrity: Goal: Risk for  impaired skin integrity will decrease Outcome: Adequate for Discharge

## 2020-03-01 DIAGNOSIS — Z471 Aftercare following joint replacement surgery: Secondary | ICD-10-CM | POA: Diagnosis not present

## 2020-03-02 ENCOUNTER — Encounter: Payer: Self-pay | Admitting: *Deleted

## 2020-03-02 ENCOUNTER — Telehealth: Payer: Self-pay | Admitting: *Deleted

## 2020-03-02 ENCOUNTER — Telehealth: Payer: Self-pay | Admitting: Family Medicine

## 2020-03-02 NOTE — Progress Notes (Signed)
  Chronic Care Management   Outreach Note  03/02/2020 Name: Kimberly King MRN: [id number] DOB: [birth year]  Referred by: Alycia Rossetti, MD Reason for referral : No chief complaint on file.   A second unsuccessful telephone outreach was attempted today. The patient was referred to pharmacist for assistance with care management and care coordination.  This note is not being shared with the patient for the following reason: To respect privacy (The patient or proxy has requested that the information not be shared).  Follow Up Plan:   Upstream Scheduler,  Ilda Foil

## 2020-03-02 NOTE — Telephone Encounter (Signed)
Ortho bundle D/C call completed. 

## 2020-03-05 ENCOUNTER — Telehealth: Payer: Self-pay | Admitting: *Deleted

## 2020-03-05 MED ORDER — OXYCODONE-ACETAMINOPHEN 5-325 MG PO TABS: 1.0000 | ORAL_TABLET | Freq: Three times a day (TID) | ORAL | 0 refills | Status: DC | PRN

## 2020-03-05 NOTE — Discharge Summary (Signed)
Patient ID: Holley Pruett Knack MRN: [id number] DOB/AGE: 06-10-1951 69 y.o.  Admit date: 02/28/2020 Discharge date: 03/05/2020  Admission Diagnoses:  Avascular necrosis of bone of hip, right [facility])  Discharge Diagnoses:  Principal Problem:   Avascular necrosis of bone of hip, right ([place]) Active Problems:   Status post total replacement of right hip   Past Medical History:  Diagnosis Date  . Allergy   . Anxiety   . Arthritis    knees hips neck  . Cancer [facility])    leukemia dx 06/2013 - no current treatment - being monitored  . Cataract    bilateral - being monitored, no treatment currently  . CLL (chronic lymphocytic leukemia) ([place])   . Depression   . GERD (gastroesophageal reflux disease)   . Leukocytosis    and low blood sodium   . Migraines   . Thyroid nodule    JUST WATCHING    Surgeries: Procedure(s): RIGHT TOTAL HIP ARTHROPLASTY ANTERIOR APPROACH on 02/28/2020   Consultants (if any):   Discharged Condition: Improved  Hospital Course: Quatasia Pintado Trew is an 69 y.o. female who was admitted 02/28/2020 with a diagnosis of Avascular necrosis of bone of hip, right ([place]) and went to the operating room on 02/28/2020 and underwent the above named procedures.    She was given perioperative antibiotics:  Anti-infectives (From admission, onward)   Start     Dose/Rate Route Frequency Ordered Stop   02/28/20 1800  ceFAZolin (ANCEF) IVPB 2g/100 mL premix     2 g 200 mL/hr over 30 Minutes Intravenous Every 6 hours 02/28/20 1721 02/29/20 0706   02/28/20 1320  vancomycin (VANCOCIN) powder  Status:  Discontinued       As needed 02/28/20 1342 02/28/20 1343   02/28/20 1137  ceFAZolin (ANCEF) 2-4 GM/100ML-% IVPB    Note to Pharmacy: Gregery Na   : cabinet override      02/28/20 1137 02/28/20 1814    .  She was given sequential compression devices, early ambulation, and appropriate chemoprophylaxis for DVT prophylaxis.  She benefited maximally from the hospital stay and there  were no complications.    Recent vital signs:  Vitals:   02/29/20 0920 02/29/20 1357  BP: 107/66 127/67  Pulse: 93 93  Resp: 16 17  Temp: 99.4 F (37.4 C) 99.3 F (37.4 C)  SpO2: 96% 95%    Recent laboratory studies:  Lab Results  Component Value Date   HGB 9.1 (L) 02/29/2020   HGB 11.3 (L) 02/19/2020   HGB 11.0 (L) 12/30/2019   Lab Results  Component Value Date   WBC 15.4 (H) 02/29/2020   PLT 353 02/29/2020   Lab Results  Component Value Date   INR 1.0 02/19/2020   Lab Results  Component Value Date   NA 136 02/29/2020   K 3.8 02/29/2020   CL 103 02/29/2020   CO2 27 02/29/2020   BUN 15 02/29/2020   CREATININE 0.70 02/29/2020   GLUCOSE 96 02/29/2020    Discharge Medications:   Allergies as of 02/29/2020   No Known Allergies     Medication List    STOP taking these medications   acetaminophen 500 MG tablet Commonly known as: TYLENOL   diclofenac 75 MG EC tablet Commonly known as: VOLTAREN   traMADol 50 MG tablet Commonly known as: ULTRAM     TAKE these medications   alendronate 70 MG tablet Commonly known as: FOSAMAX TAKE 1 TABLET (70 MG TOTAL) BY MOUTH EVERY 7 (SEVEN) DAYS.  TAKE WITH A FULL GLASS OF WATER ON AN EMPTY STOMACH. What changed: when to take this   aluminum-magnesium hydroxide-simethicone [id number] MG/5ML Susp Commonly known as: MAALOX Take 15 mLs by mouth daily as needed (indigestion).   amoxicillin 500 MG tablet Commonly known as: AMOXIL Take 4 tabs one hour prior to dental work What changed:   how much to take  how to take this  when to take this  additional instructions   ASPERCREME LIDOCAINE EX Apply 1 application topically daily as needed (pain).   aspirin EC 81 MG tablet Take 1 tablet (81 mg total) by mouth 2 (two) times daily.   CALCIUM 600 + D PO Take 1 tablet by mouth daily.   Denta 5000 Plus 1.1 % Crea dental cream Generic drug: sodium fluoride Place 1 application onto teeth at bedtime. brush teeth for 2  minutes and spit out do not rinse   DULoxetine 30 MG capsule Commonly known as: CYMBALTA Take 30 mg by mouth daily.   famotidine 20 MG tablet Commonly known as: PEPCID TAKE 1 TABLET BY MOUTH TWICE A DAY What changed: when to take this   Fish Oil 1200 MG Caps Take 1,200 mg by mouth 2 (two) times a week.   fluticasone 50 MCG/ACT nasal spray Commonly known as: FLONASE Place 1 spray into both nostrils daily as needed for allergies or rhinitis.   GINGER ROOT PO Take 1,500 mg by mouth daily.   ICAPS PO Take 1 tablet by mouth daily.   Melatonin 5 MG Caps Take 5 mg by mouth at bedtime as needed (sleep).   methocarbamol 750 MG tablet Commonly known as: ROBAXIN Take 1 tablet (750 mg total) by mouth 2 (two) times daily as needed for muscle spasms.   ondansetron 4 MG tablet Commonly known as: ZOFRAN Take 1-2 tablets (4-8 mg total) by mouth every 8 (eight) hours as needed for nausea or vomiting.   oxyCODONE-acetaminophen 5-325 MG tablet Commonly known as: Percocet Take 1-2 tablets by mouth every 8 (eight) hours as needed for severe pain.   pantoprazole 40 MG tablet Commonly known as: PROTONIX TAKE 1 TABLET BY MOUTH EVERY DAY   QC Severe Allergy Relief Sinus 25-5-325 MG Tabs Generic drug: diphenhydrAMINE-PE-APAP Take 1 tablet by mouth daily as needed (allergies).   SUMAtriptan 50 MG tablet Commonly known as: IMITREX Take 50 mg by mouth every 2 (two) hours as needed for migraine.   SYSTANE OP Place 1 drop into both eyes 2 (two) times daily as needed (for dry eyes).   Tart Cherry Advanced Caps Take 1 capsule by mouth daily.   Glucosamine Chondroitin Triple Tabs Take 2 tablets by mouth daily.   TURMERIC CURCUMIN PO Take 2,600 mg by mouth daily.   Vitamin D3 125 MCG (5000 UT) Caps Take 5,000 Units by mouth 2 (two) times daily.       Diagnostic Studies: DG Chest 2 View  Result Date: 02/19/2020 CLINICAL DATA:  Preoperative evaluation EXAM: CHEST - 2 VIEW COMPARISON:   2019 FINDINGS: Lungs are clear. No pleural effusion. Cardiomediastinal contours are stable with normal heart size. Cervicothoracic fusion is partially imaged. IMPRESSION: No acute process in the chest. Electronically Signed   By: Macy Mis M.D.   On: 02/19/2020 14:33   DG Pelvis Portable  Result Date: 02/28/2020 CLINICAL DATA:  Status post right hip total arthroplasty EXAM: PORTABLE PELVIS 1-2 VIEWS COMPARISON:  02/28/2020 FINDINGS: Interval postoperative findings of right hip total arthroplasty with expected overlying postoperative changes. No evidence of  perihardware fracture or component malposition. IMPRESSION: Interval postoperative findings of right hip total arthroplasty. No evidence of hardware complication. Electronically Signed   By: Eddie Candle M.D.   On: 02/28/2020 14:42   DG C-Arm 1-60 Min  Result Date: 02/28/2020 CLINICAL DATA:  Status post total hip arthroplasty EXAM: DG C-ARM 1-60 MIN; OPERATIVE RIGHT HIP WITH PELVIS FLUOROSCOPY TIME:  Fluoroscopy Time:  0 minutes 29 seconds Number of Acquired Spot Images: 5 COMPARISON:  December 16, 2019 FINDINGS: Initial image shows marked joint space narrowing and bony hypertrophy in the right hip joint region. Milder narrowing noted left hip joint. Subsequent images show total hip replacement on the right with prosthetic components well-seated on frontal view. No fracture or dislocation. IMPRESSION: Advanced arthropathy in the right hip joint with subsequent total hip replacement on the right. Prosthetic components appear well-seated on frontal view. No fracture or dislocation. Moderate narrowing left hip joint. Electronically Signed   By: Lowella Grip III M.D.   On: 02/28/2020 13:32   DG HIP OPERATIVE UNILAT W OR W/O PELVIS RIGHT  Result Date: 02/28/2020 CLINICAL DATA:  Status post total hip arthroplasty EXAM: DG C-ARM 1-60 MIN; OPERATIVE RIGHT HIP WITH PELVIS FLUOROSCOPY TIME:  Fluoroscopy Time:  0 minutes 29 seconds Number of Acquired Spot  Images: 5 COMPARISON:  December 16, 2019 FINDINGS: Initial image shows marked joint space narrowing and bony hypertrophy in the right hip joint region. Milder narrowing noted left hip joint. Subsequent images show total hip replacement on the right with prosthetic components well-seated on frontal view. No fracture or dislocation. IMPRESSION: Advanced arthropathy in the right hip joint with subsequent total hip replacement on the right. Prosthetic components appear well-seated on frontal view. No fracture or dislocation. Moderate narrowing left hip joint. Electronically Signed   By: Lowella Grip III M.D.   On: 02/28/2020 13:32    Disposition: Discharge disposition: 01-Home or Self Care         Follow-up Information    Leandrew Koyanagi, MD. Go on 03/13/2020.   Specialty: Orthopedic Surgery Why: at 10:30 am For suture removal, For wound re-check in office. Contact information: [place] [address] [phone]        Home, [facility] Follow up.   Specialty: [place] Why: Someone from the home health agency will be in contact with you after discharge to determine your first in home physical therapy visit; please be available for their call  Contact information: [place] [address] [phone]            Signed: Eduard Roux 03/05/2020, 8:24 AM

## 2020-03-05 NOTE — Telephone Encounter (Signed)
Patient called and requested refill of pain medication.

## 2020-03-06 ENCOUNTER — Telehealth: Payer: Self-pay | Admitting: *Deleted

## 2020-03-06 NOTE — Telephone Encounter (Signed)
7 day Ortho bundle call completed. 

## 2020-03-13 ENCOUNTER — Encounter: Payer: Self-pay | Admitting: Orthopaedic Surgery

## 2020-03-13 ENCOUNTER — Telehealth: Payer: Self-pay | Admitting: *Deleted

## 2020-03-13 ENCOUNTER — Ambulatory Visit (INDEPENDENT_AMBULATORY_CARE_PROVIDER_SITE_OTHER): Payer: Medicare Other | Admitting: Orthopaedic Surgery

## 2020-03-13 VITALS — Ht 64.0 in | Wt 179.0 lb

## 2020-03-13 DIAGNOSIS — Z96641 Presence of right artificial hip joint: Secondary | ICD-10-CM

## 2020-03-13 MED ORDER — MUPIROCIN 2 % EX OINT: 1.0000 "application " | TOPICAL_OINTMENT | Freq: Two times a day (BID) | CUTANEOUS | 0 refills | Status: DC

## 2020-03-13 MED ORDER — SULFAMETHOXAZOLE-TRIMETHOPRIM 800-160 MG PO TABS: 1.0000 | ORAL_TABLET | Freq: Two times a day (BID) | ORAL | 0 refills | Status: DC

## 2020-03-13 NOTE — Telephone Encounter (Signed)
Ortho bundle 14 day call completed. 

## 2020-03-13 NOTE — Progress Notes (Signed)
Post-Op Visit Note   Patient: Kimberly King           Date of Birth: June 23, 1951           MRN: [id number] Visit Date: 03/13/2020 PCP: Alycia Rossetti, MD   Assessment & Plan:  Chief Complaint:  Chief Complaint  Patient presents with   Right Hip - Routine Post Op    Right THA DOS 02-28-2020   Visit Diagnoses:  1. Status post total replacement of right hip     Plan: Patient is 2 weeks status post right total hip replacement.  She is doing well overall on has been released from home physical therapy.  She takes oxycodone twice a day but will transition to Tylenol.  Surgical incision is heel with a small area of fibrinous tissue.  No evidence of infection.  No drainage.  No evidence of seroma.  We remove the sutures today and placed mupirocin ointment on this area with a Band-Aid twice a day.  We will place her on 10 days of prophylactic Bactrim as well.  She will continue with her home exercises.  We will see her back in 4 weeks with standing AP pelvis x-rays.  She will need to continue her aspirin for DVT prophylaxis for another 4 weeks.  Follow-Up Instructions: Return in about 4 weeks (around 04/10/2020).   Orders:  No orders of the defined types were placed in this encounter.  Meds ordered this encounter  Medications   sulfamethoxazole-trimethoprim (BACTRIM DS) 800-160 MG tablet    Sig: Take 1 tablet by mouth 2 (two) times daily.    Dispense:  20 tablet    Refill:  0   mupirocin ointment (BACTROBAN) 2 %    Sig: Apply 1 application topically 2 (two) times daily.    Dispense:  22 g    Refill:  0    Imaging: No results found.  PMFS History: Patient Active Problem List   Diagnosis Date Noted   Status post total replacement of right hip 02/28/2020   Chronic midline low back pain without sciatica 01/21/2020   Chronic pain of right knee 01/21/2020   Primary osteoarthritis of right hip 12/16/2019   Situational mixed anxiety and depressive disorder 07/15/2019    Osteopenia 03/15/2019   DDD (degenerative disc disease), lumbar 11/12/2018   HNP (herniated nucleus pulposus), cervical 10/15/2018   Mild hyperlipidemia 05/23/2018   S/P TKR (total knee replacement), left 03/19/2018   Obesity (BMI 30-39.9) 12/31/2017   Primary localized osteoarthritis of knees, bilateral 12/05/2017   Avascular necrosis of bone of hip, right ([place]) 12/05/2017   Migraines 07/01/2017   Chronic neck and back pain 07/01/2017   Thyroid nodule 06/30/2017   GERD (gastroesophageal reflux disease) 06/30/2017   Vaginal atrophy 06/30/2017   Subclinical hyperthyroidism 12/06/2016   CLL (chronic lymphocytic leukemia) ([place]) 12/17/2013   COLONIC POLYPS, ADENOMATOUS 10/26/2007   Past Medical History:  Diagnosis Date   Allergy    Anxiety    Arthritis    knees hips neck   Cancer ([place])    leukemia dx 06/2013 - no current treatment - being monitored   Cataract    bilateral - being monitored, no treatment currently   CLL (chronic lymphocytic leukemia) ([facility])    Depression    GERD (gastroesophageal reflux disease)    Leukocytosis    and low blood sodium    Migraines    Thyroid nodule    JUST WATCHING    Family History  Problem Relation Age  of Onset   Colon cancer Paternal Grandmother 90   Esophageal cancer Paternal Uncle    Arthritis Mother    Heart disease Father    Lung cancer Father    Lung cancer Maternal Grandfather    Breast cancer Maternal Grandmother    Cirrhosis Brother    Rectal cancer Neg Hx    Stomach cancer Neg Hx     Past Surgical History:  Procedure Laterality Date   ANTERIOR CERVICAL DECOMP/DISCECTOMY FUSION N/A 10/15/2018   Procedure: PARTIAL REMOVAL OF ANTERIOR CERVICAL PLATE,ANTERIOR CERVICAL DECOMPRESSION/DISCECTOMY FUSION TWO  CERVICAL THREE- CERVICAL FOUR, CERVICAL FOUR- CERVICAL FIVE;  Surgeon: Jovita Gamma, MD;  Location: [facility];  Service: Neurosurgery;  Laterality: N/A;  PARTIAL REMOVAL OF ANTERIOR CERVICAL  PLATE,ANTERIOR CERVICAL DECOMPRESSION/DISCECTOMY FUSION TWO  CERVICAL THREE- CERVICAL FOUR, CERVICAL    APPENDECTOMY     BLADDER SURGERY     tack   CERVICAL FUSION     x2  -  C5-C6   COLONOSCOPY  2014   pyrtle - hx polyps   DILATION AND CURETTAGE OF UTERUS     x 2   JOINT REPLACEMENT     knee     arthroscopy    left   07/2017   KNEE SURGERY Left 2018   TONSILLECTOMY     TOTAL HIP ARTHROPLASTY Right 02/28/2020   Procedure: RIGHT TOTAL HIP ARTHROPLASTY ANTERIOR APPROACH;  Surgeon: Leandrew Koyanagi, MD;  Location: [facility];  Service: Orthopedics;  Laterality: Right;   TOTAL KNEE ARTHROPLASTY Left 03/19/2018   Procedure: LEFT TOTAL KNEE ARTHROPLASTY;  Surgeon: Leandrew Koyanagi, MD;  Location: [facility];  Service: Orthopedics;  Laterality: Left;   Social History   Occupational History   Not on file  Tobacco Use   Smoking status: Former Smoker    Packs/day: 0.25    Years: 30.00    Pack years: 7.50    Types: Cigarettes    Quit date: 04/22/2015    Years since quitting: 4.8   Smokeless tobacco: Never Used  Substance and Sexual Activity   Alcohol use: Yes    Comment: Occasional   Drug use: No   Sexual activity: Not on file

## 2020-03-16 ENCOUNTER — Encounter: Payer: Self-pay | Admitting: Family Medicine

## 2020-03-16 ENCOUNTER — Other Ambulatory Visit: Payer: Self-pay

## 2020-03-16 ENCOUNTER — Ambulatory Visit (INDEPENDENT_AMBULATORY_CARE_PROVIDER_SITE_OTHER): Payer: Medicare Other | Admitting: Family Medicine

## 2020-03-16 VITALS — BP 128/68 | HR 92 | Temp 98.1°F | Resp 14 | Ht 64.0 in | Wt 180.0 lb

## 2020-03-16 DIAGNOSIS — R350 Frequency of micturition: Secondary | ICD-10-CM

## 2020-03-16 DIAGNOSIS — C9110 Chronic lymphocytic leukemia of B-cell type not having achieved remission: Secondary | ICD-10-CM

## 2020-03-16 DIAGNOSIS — R808 Other proteinuria: Secondary | ICD-10-CM | POA: Diagnosis not present

## 2020-03-16 LAB — URINALYSIS, ROUTINE W REFLEX MICROSCOPIC
Bilirubin Urine: NEGATIVE
Glucose, UA: NEGATIVE
Hgb urine dipstick: NEGATIVE
Ketones, ur: NEGATIVE
Leukocytes,Ua: NEGATIVE
Nitrite: NEGATIVE
Specific Gravity, Urine: 1.032 (ref 1.001–1.03)
pH: 5 (ref 5.0–8.0)

## 2020-03-16 LAB — MICROSCOPIC MESSAGE

## 2020-03-16 NOTE — Patient Instructions (Signed)
We will call with lab results Complete the antibiotics F/U pending results

## 2020-03-16 NOTE — Assessment & Plan Note (Addendum)
Followed by oncology, no active treatment  Recheck CBC s/p surgery   UA shows trace protein , but no sign of UTI  For the urinary frequency- DD UTI, overflow incontinence, constipation causing urinary symptoms, doubt urinary retention   Will complete antibiotics as she is taking for skin infection  Culture sent Urine micro sent for protein Check BMET for protein Add back in stool softner for constipation component If above negative and no improvement in symptoms Will need urology If she can not pass urine at all, go to ER Pt voiced understanding of above plan

## 2020-03-16 NOTE — Progress Notes (Signed)
   Subjective:    Patient ID: Kimberly King, female    DOB: October 19, 1950, 69 y.o.   MRN: [id number]  Patient presents for Urinary Frequency (going a lot)   Pt here with urinary frequency mostly at bedtime for the past  Month. Symptoms were before her hip surgery.   Until today no real dysuria symptoms , She had a little tingling at end of urinating and a small speck of blood today only  No abd pain, no vaginal discharge She had constipation right after surgery but that has improved with use of fiber and previous stool softener.  During the day she doesn't feel like she urinate much but at night has large gushes of urine and frequency.     Taking ASA 81mg  for DVT prophylaxis   Currently on bactrim started last Friday for superficial skin infection at incision site   She was also told she had protein in urine at her pre-op    She doees have underling CLL    Review Of Systems:  GEN- denies fatigue, fever, weight loss,weakness, recent illness HEENT- denies eye drainage, change in vision, nasal discharge, CVS- denies chest pain, palpitations RESP- denies SOB, cough, wheeze ABD- denies N/V, change in stools, abd pain GU- + dysuria, hematuria, dribbling, incontinence MSK- denies joint pain, muscle aches, injury Neuro- denies headache, dizziness, syncope, seizure activity       Objective:    BP 128/68   Pulse 92   Temp 98.1 F (36.7 C) (Temporal)   Resp 14   Ht 5\' 4"  (1.626 m)   Wt 180 lb (81.6 kg)   SpO2 98%   BMI 30.90 kg/m  GEN- NAD, alert and oriented x3 HEENT- PERRL, EOMI, non injected sclera, pink conjunctiva, MMM,  CVS- RRR, no murmur RESP-CTAB ABD-NABS,soft,ND, mild ttp suprapubic region,no CVA tenderness  EXT- mild right ankle edema Pulses- Radial, DP- 2+        Assessment & Plan:      Problem List Items Addressed This Visit      Unprioritized   CLL (chronic lymphocytic leukemia) ([place])    Followed by oncology, no active treatment  Recheck CBC s/p  surgery   UA shows trace protein , but no sign of UTI  For the urinary frequency- DD UTI, overflow incontinence, constipation causing urinary symptoms, doubt urinary retention   Will complete antibiotics as she is taking for skin infection  Culture sent Urine micro sent for protein Check BMET for protein Add back in stool softner for constipation component If above negative and no improvement in symptoms Will need urology If she can not pass urine at all, go to ER Pt voiced understanding of above plan        Other Visit Diagnoses    Urinary frequency    -  Primary   Relevant Orders   Urinalysis, Routine w reflex microscopic (Completed)   Urine Culture   BASIC METABOLIC PANEL WITH GFR   CBC with Differential/Platelet   Other proteinuria       Relevant Orders   BASIC METABOLIC PANEL WITH GFR   Microalbumin / creatinine urine ratio   CBC with Differential/Platelet      Note: This dictation was prepared with Dragon dictation along with smaller phrase technology. Any transcriptional errors that result from this process are unintentional.

## 2020-03-17 ENCOUNTER — Other Ambulatory Visit: Payer: Self-pay | Admitting: *Deleted

## 2020-03-17 DIAGNOSIS — N179 Acute kidney failure, unspecified: Secondary | ICD-10-CM

## 2020-03-17 LAB — BASIC METABOLIC PANEL WITH GFR
BUN/Creatinine Ratio: 23 (calc) — ABNORMAL HIGH (ref 6–22)
BUN: 27 mg/dL — ABNORMAL HIGH (ref 7–25)
CO2: 25 mmol/L (ref 20–32)
Calcium: 10.3 mg/dL (ref 8.6–10.4)
Chloride: 102 mmol/L (ref 98–110)
Creat: 1.18 mg/dL — ABNORMAL HIGH (ref 0.50–0.99)
GFR, Est African American: 55 mL/min/{1.73_m2} — ABNORMAL LOW (ref >=60)
GFR, Est Non African American: 48 mL/min/{1.73_m2} — ABNORMAL LOW (ref >=60)
Glucose, Bld: 90 mg/dL (ref 65–99)
Potassium: 4.8 mmol/L (ref 3.5–5.3)
Sodium: 138 mmol/L (ref 135–146)

## 2020-03-17 LAB — CBC WITH DIFFERENTIAL/PLATELET
Absolute Monocytes: 1476 cells/uL — ABNORMAL HIGH (ref 200–950)
Basophils Absolute: 185 cells/uL (ref 0–200)
Basophils Relative: 0.9 %
Eosinophils Absolute: 410 cells/uL (ref 15–500)
Eosinophils Relative: 2 %
HCT: 30 % — ABNORMAL LOW (ref 35.0–45.0)
Hemoglobin: 9.3 g/dL — ABNORMAL LOW (ref 11.7–15.5)
Lymphs Abs: 8938 cells/uL — ABNORMAL HIGH (ref 850–3900)
MCH: 26.2 pg — ABNORMAL LOW (ref 27.0–33.0)
MCHC: 31 g/dL — ABNORMAL LOW (ref 32.0–36.0)
MCV: 84.5 fL (ref 80.0–100.0)
MPV: 9.6 fL (ref 7.5–12.5)
Monocytes Relative: 7.2 %
Neutro Abs: 9492 cells/uL — ABNORMAL HIGH (ref 1500–7800)
Neutrophils Relative %: 46.3 %
Platelets: 804 10*3/uL — ABNORMAL HIGH (ref 140–400)
RBC: 3.55 10*6/uL — ABNORMAL LOW (ref 3.80–5.10)
RDW: 14.2 % (ref 11.0–15.0)
Total Lymphocyte: 43.6 %
WBC: 20.5 10*3/uL — ABNORMAL HIGH (ref 3.8–10.8)

## 2020-03-17 LAB — URINE CULTURE
MICRO NUMBER:: 10560386
Result:: NO GROWTH
SPECIMEN QUALITY:: ADEQUATE

## 2020-03-17 LAB — MICROALBUMIN / CREATININE URINE RATIO
Creatinine, Urine: 233 mg/dL (ref 20–275)
Microalb Creat Ratio: 9 mcg/mg creat (ref <30)
Microalb, Ur: 2.1 mg/dL

## 2020-03-18 ENCOUNTER — Telehealth: Payer: Self-pay | Admitting: Family Medicine

## 2020-03-18 ENCOUNTER — Ambulatory Visit: Payer: Medicare Other | Admitting: Orthopaedic Surgery

## 2020-03-18 ENCOUNTER — Other Ambulatory Visit: Payer: Self-pay | Admitting: *Deleted

## 2020-03-18 DIAGNOSIS — N179 Acute kidney failure, unspecified: Secondary | ICD-10-CM

## 2020-03-18 NOTE — Progress Notes (Signed)
  Chronic Care Management   Outreach Note  03/18/2020 Name: Kimberly King MRN: [id number] DOB: [birth year]  Referred by: Alycia Rossetti, MD Reason for referral : No chief complaint on file.   Third unsuccessful telephone outreach was attempted today. The patient was referred to the pharmacist for assistance with care management and care coordination.   Follow Up Plan:   [place]

## 2020-03-18 NOTE — Progress Notes (Signed)
  Chronic Care Management   Note  03/18/2020 Name: Kimberly King MRN: [id number] DOB: 01/15/1951  Kimberly King is a 68 y.o. year old female who is a primary care patient of [place], Modena Nunnery, MD. I reached out to [employer] by phone today in response to a referral sent by Kimberly King's PCP, [place], Modena Nunnery, MD.   Kimberly King was given information about Chronic Care Management services today including:  1. CCM service includes personalized support from designated clinical staff supervised by her physician, including individualized plan of care and coordination with other care providers 2. 24/7 contact phone numbers for assistance for urgent and routine care needs. 3. Service will only be billed when office clinical staff spend 20 minutes or more in a month to coordinate care. 4. Only one practitioner may furnish and bill the service in a calendar month. 5. The patient may stop CCM services at any time (effective at the end of the month) by phone call to the office staff.   Patient agreed to services and verbal consent obtained.   Follow up plan:  [place]

## 2020-03-23 ENCOUNTER — Other Ambulatory Visit: Payer: Medicare Other

## 2020-03-23 ENCOUNTER — Other Ambulatory Visit: Payer: Self-pay

## 2020-03-23 DIAGNOSIS — N179 Acute kidney failure, unspecified: Secondary | ICD-10-CM

## 2020-03-23 LAB — CBC WITH DIFFERENTIAL/PLATELET
Absolute Monocytes: 1114 cells/uL — ABNORMAL HIGH (ref 200–950)
Basophils Absolute: 157 cells/uL (ref 0–200)
Basophils Relative: 0.9 %
Eosinophils Absolute: 783 cells/uL — ABNORMAL HIGH (ref 15–500)
Eosinophils Relative: 4.5 %
HCT: 30.7 % — ABNORMAL LOW (ref 35.0–45.0)
Hemoglobin: 9.6 g/dL — ABNORMAL LOW (ref 11.7–15.5)
Lymphs Abs: 9535 cells/uL — ABNORMAL HIGH (ref 850–3900)
MCH: 26.4 pg — ABNORMAL LOW (ref 27.0–33.0)
MCHC: 31.3 g/dL — ABNORMAL LOW (ref 32.0–36.0)
MCV: 84.3 fL (ref 80.0–100.0)
MPV: 10 fL (ref 7.5–12.5)
Monocytes Relative: 6.4 %
Neutro Abs: 5812 cells/uL (ref 1500–7800)
Neutrophils Relative %: 33.4 %
Platelets: 616 10*3/uL — ABNORMAL HIGH (ref 140–400)
RBC: 3.64 10*6/uL — ABNORMAL LOW (ref 3.80–5.10)
RDW: 14.3 % (ref 11.0–15.0)
Total Lymphocyte: 54.8 %
WBC: 17.4 10*3/uL — ABNORMAL HIGH (ref 3.8–10.8)

## 2020-03-23 LAB — BASIC METABOLIC PANEL
BUN: 22 mg/dL (ref 7–25)
CO2: 25 mmol/L (ref 20–32)
Calcium: 10.1 mg/dL (ref 8.6–10.4)
Chloride: 104 mmol/L (ref 98–110)
Creat: 0.9 mg/dL (ref 0.50–0.99)
Glucose, Bld: 79 mg/dL (ref 65–99)
Potassium: 4.8 mmol/L (ref 3.5–5.3)
Sodium: 137 mmol/L (ref 135–146)

## 2020-03-26 ENCOUNTER — Other Ambulatory Visit: Payer: Self-pay | Admitting: Family Medicine

## 2020-03-26 DIAGNOSIS — C9110 Chronic lymphocytic leukemia of B-cell type not having achieved remission: Secondary | ICD-10-CM

## 2020-03-26 NOTE — Telephone Encounter (Signed)
Ok to refill??  Last office visit 03/16/2020.  Last refill 12/10/2019, #2 refills.

## 2020-03-30 ENCOUNTER — Telehealth: Payer: Self-pay | Admitting: *Deleted

## 2020-03-30 NOTE — Telephone Encounter (Signed)
Attempted 30 day Ortho bundle call. No answer-left VM requesting call back.

## 2020-03-31 DIAGNOSIS — Z471 Aftercare following joint replacement surgery: Secondary | ICD-10-CM | POA: Diagnosis not present

## 2020-04-01 ENCOUNTER — Telehealth: Payer: Self-pay | Admitting: *Deleted

## 2020-04-01 NOTE — Telephone Encounter (Signed)
Ortho bundle 30 day call completed. °

## 2020-04-10 ENCOUNTER — Ambulatory Visit (INDEPENDENT_AMBULATORY_CARE_PROVIDER_SITE_OTHER): Payer: Medicare Other | Admitting: Orthopaedic Surgery

## 2020-04-10 ENCOUNTER — Ambulatory Visit (INDEPENDENT_AMBULATORY_CARE_PROVIDER_SITE_OTHER): Payer: Medicare Other

## 2020-04-10 ENCOUNTER — Other Ambulatory Visit: Payer: Self-pay | Admitting: Orthopaedic Surgery

## 2020-04-10 ENCOUNTER — Other Ambulatory Visit: Payer: Self-pay

## 2020-04-10 DIAGNOSIS — Z96641 Presence of right artificial hip joint: Secondary | ICD-10-CM | POA: Diagnosis not present

## 2020-04-10 MED ORDER — COLLAGENASE 250 UNIT/GM EX OINT: 1.0000 "application " | TOPICAL_OINTMENT | Freq: Two times a day (BID) | CUTANEOUS | 0 refills | Status: DC

## 2020-04-10 NOTE — Progress Notes (Signed)
Post-Op Visit Note   Patient: Kimberly King Reason           Date of Birth: [birth year]           MRN: [id number] Visit Date: 04/10/2020 PCP: Alycia Rossetti, MD   Assessment & Plan:  Visit Diagnoses:  1. Status post total replacement of right hip     Plan: 6 week THA follow up plan  Patient presents for follow up 6 weeks status post total hip replacement. She is doing great and has no complaints other than some numbness in the thigh.  Wants to go back to work on Monday.  Some soreness with increased activity.  Just a small area of fibrinous exudative tissue.  No signs of infection.  Santyl ointment ordered.  TED hose may be discontinued. Radiographs reveal a total hip arthroplasty in good position, with no evidence of subsidence, loosening, or complicating features. It was reinforced that with any procedure including dental work, colonoscopy, or any invasive procedure that pre-procedural prophylactic antibiotics must be taken to decrease the risk of infection. Reminders were given about signs to be aware of including redness, drainage, increased pain, fevers, calf pain, shortness of breath, or any concern should generate a phone call or a return to see [place] immediately. Will plan to follow up at 3 months postoperatively for next evaluation.  Follow-Up Instructions: Return in about 6 weeks (around 05/22/2020).   Orders:  Orders Placed This Encounter  Procedures  . XR Pelvis 1-2 Views   Meds ordered this encounter  Medications  . collagenase (SANTYL) ointment    Sig: Apply 1 application topically in the morning and at bedtime.    Dispense:  30 g    Refill:  0    Imaging: XR Pelvis 1-2 Views  Result Date: 04/10/2020 Stable total hip replacement without complications   PMFS History: Patient Active Problem List   Diagnosis Date Noted  . Status post total replacement of right hip 02/28/2020  . Chronic midline low back pain without sciatica 01/21/2020  . Chronic pain of right knee  01/21/2020  . Primary osteoarthritis of right hip 12/16/2019  . Situational mixed anxiety and depressive disorder 07/15/2019  . Osteopenia 03/15/2019  . DDD (degenerative disc disease), lumbar 11/12/2018  . HNP (herniated nucleus pulposus), cervical 10/15/2018  . Mild hyperlipidemia 05/23/2018  . S/P TKR (total knee replacement), left 03/19/2018  . Obesity (BMI 30-39.9) 12/31/2017  . Primary localized osteoarthritis of knees, bilateral 12/05/2017  . Avascular necrosis of bone of hip, right ([place]) 12/05/2017  . Migraines 07/01/2017  . Chronic neck and back pain 07/01/2017  . Thyroid nodule 06/30/2017  . GERD (gastroesophageal reflux disease) 06/30/2017  . Vaginal atrophy 06/30/2017  . Subclinical hyperthyroidism 12/06/2016  . CLL (chronic lymphocytic leukemia) ([place]) 12/17/2013  . COLONIC POLYPS, ADENOMATOUS 10/26/2007   Past Medical History:  Diagnosis Date  . Allergy   . Anxiety   . Arthritis    knees hips neck  . Cancer [facility])    leukemia dx 06/2013 - no current treatment - being monitored  . Cataract    bilateral - being monitored, no treatment currently  . CLL (chronic lymphocytic leukemia) ([place])   . Depression   . GERD (gastroesophageal reflux disease)   . Leukocytosis    and low blood sodium   . Migraines   . Thyroid nodule    JUST WATCHING    Family History  Problem Relation Age of Onset  . Colon cancer Paternal Grandmother 68  .  Esophageal cancer Paternal Uncle   . Arthritis Mother   . Heart disease Father   . Lung cancer Father   . Lung cancer Maternal Grandfather   . Breast cancer Maternal Grandmother   . Cirrhosis Brother   . Rectal cancer Neg Hx   . Stomach cancer Neg Hx     Past Surgical History:  Procedure Laterality Date  . ANTERIOR CERVICAL DECOMP/DISCECTOMY FUSION N/A 10/15/2018   Procedure: PARTIAL REMOVAL OF ANTERIOR CERVICAL PLATE,ANTERIOR CERVICAL DECOMPRESSION/DISCECTOMY FUSION TWO  CERVICAL THREE- CERVICAL FOUR, CERVICAL FOUR- CERVICAL FIVE;   Surgeon: Jovita Gamma, MD;  Location: [place];  Service: Neurosurgery;  Laterality: N/A;  PARTIAL REMOVAL OF ANTERIOR CERVICAL PLATE,ANTERIOR CERVICAL DECOMPRESSION/DISCECTOMY FUSION TWO  CERVICAL THREE- CERVICAL FOUR, CERVICAL   . APPENDECTOMY    . BLADDER SURGERY     tack  . CERVICAL FUSION     x2  -  C5-C6  . COLONOSCOPY  2014   pyrtle - hx polyps  . DILATION AND CURETTAGE OF UTERUS     x 2  . JOINT REPLACEMENT    . knee     arthroscopy    left   07/2017  . KNEE SURGERY Left 2018  . TONSILLECTOMY    . TOTAL HIP ARTHROPLASTY Right 02/28/2020   Procedure: RIGHT TOTAL HIP ARTHROPLASTY ANTERIOR APPROACH;  Surgeon: Leandrew Koyanagi, MD;  Location: [place];  Service: Orthopedics;  Laterality: Right;  . TOTAL KNEE ARTHROPLASTY Left 03/19/2018   Procedure: LEFT TOTAL KNEE ARTHROPLASTY;  Surgeon: Leandrew Koyanagi, MD;  Location: [place];  Service: Orthopedics;  Laterality: Left;   Social History   Occupational History  . Not on file  Tobacco Use  . Smoking status: Former Smoker    Packs/day: 0.25    Years: 30.00    Pack years: 7.50    Types: Cigarettes    Quit date: 04/22/2015    Years since quitting: 4.9  . Smokeless tobacco: Never Used  Vaping Use  . Vaping Use: Never used  Substance and Sexual Activity  . Alcohol use: Yes    Comment: Occasional  . Drug use: No  . Sexual activity: Not on file

## 2020-04-10 NOTE — Telephone Encounter (Signed)
Please advise 

## 2020-05-11 ENCOUNTER — Ambulatory Visit (INDEPENDENT_AMBULATORY_CARE_PROVIDER_SITE_OTHER): Payer: Medicare Other | Admitting: Family Medicine

## 2020-05-11 ENCOUNTER — Ambulatory Visit: Payer: Medicare Other

## 2020-05-11 ENCOUNTER — Other Ambulatory Visit: Payer: Self-pay

## 2020-05-11 ENCOUNTER — Encounter: Payer: Self-pay | Admitting: Family Medicine

## 2020-05-11 VITALS — BP 120/64 | HR 90 | Temp 98.2°F | Resp 14 | Ht 64.0 in | Wt 183.0 lb

## 2020-05-11 DIAGNOSIS — F4323 Adjustment disorder with mixed anxiety and depressed mood: Secondary | ICD-10-CM

## 2020-05-11 DIAGNOSIS — G4700 Insomnia, unspecified: Secondary | ICD-10-CM

## 2020-05-11 DIAGNOSIS — N898 Other specified noninflammatory disorders of vagina: Secondary | ICD-10-CM | POA: Diagnosis not present

## 2020-05-11 DIAGNOSIS — N952 Postmenopausal atrophic vaginitis: Secondary | ICD-10-CM

## 2020-05-11 DIAGNOSIS — K219 Gastro-esophageal reflux disease without esophagitis: Secondary | ICD-10-CM

## 2020-05-11 DIAGNOSIS — N3281 Overactive bladder: Secondary | ICD-10-CM | POA: Diagnosis not present

## 2020-05-11 DIAGNOSIS — E785 Hyperlipidemia, unspecified: Secondary | ICD-10-CM

## 2020-05-11 DIAGNOSIS — B351 Tinea unguium: Secondary | ICD-10-CM | POA: Diagnosis not present

## 2020-05-11 DIAGNOSIS — Z9889 Other specified postprocedural states: Secondary | ICD-10-CM | POA: Diagnosis not present

## 2020-05-11 LAB — WET PREP FOR TRICH, YEAST, CLUE

## 2020-05-11 MED ORDER — MIRTAZAPINE 7.5 MG PO TABS
7.5000 mg | ORAL_TABLET | Freq: Every day | ORAL | 1 refills | Status: DC
Start: 2020-05-11 — End: 2020-05-11

## 2020-05-11 MED ORDER — TEMAZEPAM 7.5 MG PO CAPS
7.5000 mg | ORAL_CAPSULE | Freq: Every evening | ORAL | 1 refills | Status: DC | PRN
Start: 2020-05-11 — End: 2020-07-13

## 2020-05-11 MED ORDER — ESTRADIOL 0.1 MG/GM VA CREA
1.0000 | TOPICAL_CREAM | VAGINAL | 2 refills | Status: DC
Start: 2020-05-11 — End: 2020-07-24

## 2020-05-11 NOTE — Patient Instructions (Addendum)
Try Restoril (Temazepam) 7.5mg  at bedtime for sleep Urology referral  Topical estrogen twice a week  F/U as previous

## 2020-05-11 NOTE — Chronic Care Management (AMB) (Signed)
Chronic Care Management Pharmacy  Name: Kimberly King  MRN: [id number] DOB: 21-Aug-1951  Chief Complaint/ HPI  Kimberly King,  69 y.o. , female presents for their Initial CCM visit with the clinical pharmacist via telephone.  PCP : Alycia Rossetti, MD  Their chronic conditions include: GERD, osteopenia, hyperlipidemia, anxiety/depression, DDD.  Office Visits:  05/11/2020 [facility]) -   Ongoing depression due to mother  Trial of Temazepam 7.41m at night  Concerned with cost of medications  03/16/2020 ([place]) - urinary frequency, mainly at night  Complete ABX for skin infection, urinalysis ordered  Urine culture negative  Consult Visit:  03/13/2020  ([facility]) - 2 week post hip replacement  Doing very well  Given Bactrim x 10 days for prohylaxis, continue ASA for prophylaxis  Medications: Outpatient Encounter Medications as of 05/11/2020  Medication Sig  . alendronate (FOSAMAX) 70 MG tablet TAKE 1 TABLET (70 MG TOTAL) BY MOUTH EVERY 7 (SEVEN) DAYS. TAKE WITH A FULL GLASS OF WATER ON AN EMPTY STOMACH. (Patient taking differently: Take 70 mg by mouth every Monday. Take with a full glass of water on an empty stomach.)  . aluminum-magnesium hydroxide-simethicone (MAALOX) 2867-619-50MG/5ML SUSP Take 15 mLs by mouth daily as needed (indigestion).  . ASPERCREME LIDOCAINE EX Apply 1 application topically daily as needed (pain).  . Calcium Carb-Cholecalciferol (CALCIUM 600 + D PO) Take 1 tablet by mouth daily.  . Cholecalciferol (VITAMIN D3) 125 MCG (5000 UT) CAPS Take 5,000 Units by mouth 2 (two) times daily.   . diphenhydrAMINE-PE-APAP (QC SEVERE ALLERGY RELIEF SINUS) 25-5-325 MG TABS Take 1 tablet by mouth daily as needed (allergies).  .Marland Kitchenestradiol (ESTRACE VAGINAL) 0.1 MG/GM vaginal cream Place 1 Applicatorful vaginally 2 (two) times a week.  . famotidine (PEPCID) 20 MG tablet TAKE 1 TABLET BY MOUTH TWICE A DAY (Patient taking differently: Take 20 mg by mouth at bedtime.  )  . fluticasone (FLONASE) 50 MCG/ACT nasal spray Place 1 spray into both nostrils daily as needed for allergies or rhinitis.   . Ginger, Zingiber officinalis, (GINGER ROOT PO) Take 1,500 mg by mouth daily.  . Melatonin 5 MG CAPS Take 5 mg by mouth at bedtime as needed (sleep).  . methocarbamol (ROBAXIN) 750 MG tablet Take 1 tablet (750 mg total) by mouth 2 (two) times daily as needed for muscle spasms.  . Misc Natural Products (GLUCOSAMINE CHONDROITIN TRIPLE) TABS Take 2 tablets by mouth daily.  . Misc Natural Products (TART CHERRY ADVANCED) CAPS Take 1 capsule by mouth daily.   . Multiple Vitamins-Minerals (ICAPS PO) Take 1 tablet by mouth daily.  . mupirocin ointment (BACTROBAN) 2 % APPLY TO AFFECTED AREA TWICE A DAY  . Omega-3 Fatty Acids (FISH OIL) 1200 MG CAPS Take 1,200 mg by mouth 2 (two) times a week.  . ondansetron (ZOFRAN) 4 MG tablet Take 1-2 tablets (4-8 mg total) by mouth every 8 (eight) hours as needed for nausea or vomiting.  . pantoprazole (PROTONIX) 40 MG tablet TAKE 1 TABLET BY MOUTH EVERY DAY (Patient taking differently: Take 40 mg by mouth daily. )  . Polyethyl Glycol-Propyl Glycol (SYSTANE OP) Place 1 drop into both eyes 2 (two) times daily as needed (for dry eyes).   . SUMAtriptan (IMITREX) 50 MG tablet Take 50 mg by mouth every 2 (two) hours as needed for migraine.   . temazepam (RESTORIL) 7.5 MG capsule Take 1 capsule (7.5 mg total) by mouth at bedtime as needed for sleep.  . traMADol (ULTRAM) 50 MG  tablet TAKE 1 TABLET BY MOUTH 2 TIMES DAILY AS NEEDED FOR MODERATE PAIN.  . TURMERIC CURCUMIN PO Take 2,600 mg by mouth daily.   . [DISCONTINUED] aspirin EC 81 MG tablet Take 1 tablet (81 mg total) by mouth 2 (two) times daily.  . [DISCONTINUED] collagenase (SANTYL) ointment Apply 1 application topically in the morning and at bedtime.  . [DISCONTINUED] DENTA 5000 PLUS 1.1 % CREA dental cream Place 1 application onto teeth at bedtime. brush teeth for 2 minutes and spit out do  not rinse  . [DISCONTINUED] DULoxetine (CYMBALTA) 30 MG capsule Take 30 mg by mouth daily.  . [DISCONTINUED] oxyCODONE-acetaminophen (PERCOCET) 5-325 MG tablet Take 1-2 tablets by mouth every 8 (eight) hours as needed for severe pain.  . [DISCONTINUED] oxyCODONE-acetaminophen (PERCOCET) 5-325 MG tablet Take 1-2 tablets by mouth every 8 (eight) hours as needed for severe pain.  . [DISCONTINUED] sulfamethoxazole-trimethoprim (BACTRIM DS) 800-160 MG tablet Take 1 tablet by mouth 2 (two) times daily.   No facility-administered encounter medications on file as of 05/11/2020.     Current Diagnosis/Assessment:   [employer] officer:   . Difficulty of Paying Living Expenses:     Goals Addressed            This Visit's Progress   . Pharmacy Care Plan:       CARE PLAN ENTRY (see longitudinal plan of care for additional care plan information)  Current Barriers:  . Chronic Disease Management support, education, and care coordination needs related to Hyperlipidemia, GERD, Depression, and Anxiety   Hyperlipidemia Lab Results  Component Value Date/Time   LDLCALC 118 (H) 07/10/2019 08:19 AM   . Pharmacist Clinical Goal(s): o Over the next 180 days, patient will work with PharmD and providers to achieve LDL goal < 100 . Current regimen:  o No medication . Interventions: o Reviewed most recent lipid panel o Recommend repeat lipid panel for yearly preventative labs . Patient self care activities - Over the next 180 days, patient will: o Continue to focus on good diet to control cholesterol o Continue to follow up with PCP for routine labs  Depression/Anxiety . Pharmacist Clinical Goal(s) o Over the next 180 days, patient will work with PharmD and providers to optimize medication and minimize symptoms related to depression and anxiety. . Current regimen:  o Temazepam 7.7m at bedtime for sleep . Interventions: o Discussed current sleep patters o Discussed previously tried  medications . Patient self care activities - Over the next 180 days, patient will: o Contact providers should sleep not resolve or if lack of motivation during the day continues GERD . Pharmacist Clinical Goal(s) o Over the next 180 days, patient will work with PharmD and providers to optimize medication and minimize symptoms related to GERD. . Current regimen:  o Pantoprazole 442mdaily o Famotidine 201maily . Interventions: o Discussed diet and trigger foods . Patient self care activities - Over the next 180 days, patient will:  Continue to take medications as prescribed.  Work on limiting trigger foods to minimize frequency of GERD symptoms. Initial goal documentation        GERD    Patient has failed these meds in past: none noted Patient is currently controlled on the following medications:   Pantoprazole 53m3mily  Famotidine 20mg96mly   Patient reports GERD under control lately.  Reports adherence to medications and knows trigger foods.  Working on limiting trigger foods.  Plan  Continue current medications  Hyperlipidemia   LDL goal <  100  Lipid Panel     Component Value Date/Time   CHOL 191 07/10/2019 0819   TRIG 102 07/10/2019 0819   HDL 53 07/10/2019 0819   LDLCALC 118 (H) 07/10/2019 0819    Hepatic Function Latest Ref Rng & Units 02/19/2020 12/30/2019 07/10/2019  Total Protein 6.5 - 8.1 g/dL 7.0 7.1 6.7  Albumin 3.5 - 5.0 g/dL 4.3 4.2 -  AST 15 - 41 U/L _0 ALT 0 - 44 U/L 23 26 36(H)  Alk Phosphatase 38 - 126 U/L 105 114 -  Total Bilirubin 0.3 - 1.2 mg/dL 0.7 0.4 0.5     The 10-year ASCVD risk score Mikey Bussing DC Jr., et al., 2013) is: 11.4%   Values used to calculate the score:     Age: 43 years     Sex: Female     Is Non-Hispanic African American: No     Diabetic: No     Tobacco smoker: Yes     Systolic Blood Pressure: 782 mmHg     Is BP treated: No     HDL Cholesterol: 53 mg/dL     Total Cholesterol: 191 mg/dL   Patient has failed  these meds in past: none noted Patient is currently uncontrolled on the following medications:  . None  Lipids slightly elevated on last lipid panel September 2020.  Patient not on any medication currently.  Working on diet and discussed diet conducive to lowering cholesterol.  Plan  Continue control with diet and exercise  Depression/Anxiety   Patient has failed these meds in past: Lexapro, Cymbalta, Trazodone Patient is currently uncontrolled on the following medications:   Temazepam 7.62m daily  Dr. DBuelah Manisjust started her on this today.  Patient having trouble sleeping almost nightly.  Goes to bed around 10 pm but is waking up about every two hours.  Does have hard time falling back asleep due to mind racing.  Depression due to taking care of her mother who is in assisted living facility and worrying about her.  Plan  Try temazepam 7.559mhs to see if helps, patient has already failed therapy with Lexapro, cymbalta previously due to allergies.  Underlying depression probably leading to some of sleep disturbances too.  Instructed her to call providers if Temazepam does not resolve issue. DDD   Patient has failed these meds in past: none noted Patient is currently controlled on the following medications:  . Tramadol 5077mid prn  Patient reports pain levels are controlled.    Plan  Continue current medications Osteopenia   Last DEXA Scan: 11/23/2018  T-Score femoral neck: -1.5  T-Score lumbar spine: -0.9  10-year probability of major osteoporotic fracture: 8.8%  10-year probability of hip fracture: 1.0%  Vit D, 25-Hydroxy  Date Value Ref Range Status  11/14/2018 47 30 - 100 ng/mL Final    Comment:    Vitamin D Status         25-OH Vitamin D: . Deficiency:                    <20 ng/mL Insufficiency:             20 - 29 ng/mL Optimal:                 > or = 30 ng/mL . For 25-OH Vitamin D testing on patients on  D2-supplementation and patients for whom quantitation    of D2 and D3 fractions is required, the QuestAssureD(TM) 25-OH VIT D, (D2,D3),  LC/MS/MS is recommended: order  code [phone] (patients >14yr). . For more information on this test, go to: http://education.questdiagnostics.com/faq/FAQ163 (This link is being provided for  informational/educational purposes only.)      Patient has history of very brittle bones during surgery.    Patient has failed these meds in past: none noted Patient is currently controlled on the following medications:  . Alendronate 788monce weekly  We discussed:  Recommend [phone] units of vitamin D daily. Recommend 1200 mg of calcium daily from dietary and supplemental sources. Counseled on oral bisphosphonate administration: take in the morning, 30 minutes prior to food with 6-8 oz of water. Do not lie down for at least 30 minutes after taking. Reports adherence to medication and no adverse effects. Plan  Continue current medications   Vaccines   Reviewed and discussed patient's vaccination history.    Immunization History  Administered Date(s) Administered  . Influenza, High Dose Seasonal PF 05/24/2019  . Influenza,inj,Quad PF,6+ Mos 06/19/2013, 07/12/2014, 08/09/2015, 06/23/2016, 06/10/2017, 07/04/2018  . Pneumococcal Conjugate-13 06/10/2017  . Pneumococcal Polysaccharide-23 06/19/2013, 07/15/2019  . Td 12/31/2007    Plan  Recommended patient receive Tdap vaccine in office. Medication Management   . Miscellaneous medications:  o Fluticasone 5051mo Imitrex 12m64mOTC's:  o Turmeric o Fish Oil 1200mg71mlucosamine o Ginger root o Calcium + vitamin D . Patient currently uses [facility].   . Patient reports using pill box method to organize medications and promote adherence. . Patient denies missed doses of medication.   ChrisBeverly MilchrmD Clinical Pharmacist [place])[phone]

## 2020-05-11 NOTE — Patient Instructions (Addendum)
Visit Information Thank you for meeting with me today!  I look forward to working with you to help you meet all of your healthcare goals and answer any questions you may have.  Feel free to contact me anytime!  Goals Addressed            This Visit's Progress   . Pharmacy Care Plan:       CARE PLAN ENTRY (see longitudinal plan of care for additional care plan information)  Current Barriers:  . Chronic Disease Management support, education, and care coordination needs related to Hyperlipidemia, GERD, Depression, and Anxiety   Hyperlipidemia Lab Results  Component Value Date/Time   LDLCALC 118 (H) 07/10/2019 08:19 AM   . Pharmacist Clinical Goal(s): o Over the next 180 days, patient will work with PharmD and providers to achieve LDL goal < 100 . Current regimen:  o No medication . Interventions: o Reviewed most recent lipid panel o Recommend repeat lipid panel for yearly preventative labs . Patient self care activities - Over the next 180 days, patient will: o Continue to focus on good diet to control cholesterol o Continue to follow up with PCP for routine labs  Depression/Anxiety . Pharmacist Clinical Goal(s) o Over the next 180 days, patient will work with PharmD and providers to optimize medication and minimize symptoms related to depression and anxiety. . Current regimen:  o Temazepam 7.5mg  at bedtime for sleep . Interventions: o Discussed current sleep patters o Discussed previously tried medications . Patient self care activities - Over the next 180 days, patient will: o Contact providers should sleep not resolve or if lack of motivation during the day continues GERD . Pharmacist Clinical Goal(s) o Over the next 180 days, patient will work with PharmD and providers to optimize medication and minimize symptoms related to GERD. . Current regimen:  o Pantoprazole 40mg  daily o Famotidine 20mg  daily . Interventions: o Discussed diet and trigger foods . Patient self  care activities - Over the next 180 days, patient will:  Continue to take medications as prescribed.  Work on limiting trigger foods to minimize frequency of GERD symptoms. Initial goal documentation        Ms. Pauley was given information about Chronic Care Management services today including:  1. CCM service includes personalized support from designated clinical staff supervised by her physician, including individualized plan of care and coordination with other care providers 2. 24/7 contact phone numbers for assistance for urgent and routine care needs. 3. Standard insurance, coinsurance, copays and deductibles apply for chronic care management only during months in which we provide at least 20 minutes of these services. Most insurances cover these services at 100%, however patients may be responsible for any copay, coinsurance and/or deductible if applicable. This service may help you avoid the need for more expensive face-to-face services. 4. Only one practitioner may furnish and bill the service in a calendar month. 5. The patient may stop CCM services at any time (effective at the end of the month) by phone call to the office staff.  Patient agreed to services and verbal consent obtained.   The patient verbalized understanding of instructions provided today and agreed to receive a mailed copy of patient instruction and/or educational materials. Telephone follow up appointment with pharmacy team member scheduled for: 6 months  Beverly Milch, PharmD Clinical Pharmacist [place] [phone]   Managing Anxiety, Adult After being diagnosed with an anxiety disorder, you may be relieved to know why you have felt  or behaved a certain way. You may also feel overwhelmed about the treatment ahead and what it will mean for your life. With care and support, you can manage this condition and recover from it. How to manage lifestyle changes Managing stress and  anxiety  Stress is your body's reaction to life changes and events, both good and bad. Most stress will last just a few hours, but stress can be ongoing and can lead to more than just stress. Although stress can play a major role in anxiety, it is not the same as anxiety. Stress is usually caused by something external, such as a deadline, test, or competition. Stress normally passes after the triggering event has ended.  Anxiety is caused by something internal, such as imagining a terrible outcome or worrying that something will go wrong that will devastate you. Anxiety often does not go away even after the triggering event is over, and it can become long-term (chronic) worry. It is important to understand the differences between stress and anxiety and to manage your stress effectively so that it does not lead to an anxious response. Talk with your health care provider or a counselor to learn more about reducing anxiety and stress. He or she may suggest tension reduction techniques, such as:  Music therapy. This can include creating or listening to music that you enjoy and that inspires you.  Mindfulness-based meditation. This involves being aware of your normal breaths while not trying to control your breathing. It can be done while sitting or walking.  Centering prayer. This involves focusing on a word, phrase, or sacred image that means something to you and brings you peace.  Deep breathing. To do this, expand your stomach and inhale slowly through your nose. Hold your breath for 3-5 seconds. Then exhale slowly, letting your stomach muscles relax.  Self-talk. This involves identifying thought patterns that lead to anxiety reactions and changing those patterns.  Muscle relaxation. This involves tensing muscles and then relaxing them. Choose a tension reduction technique that suits your lifestyle and personality. These techniques take time and practice. Set aside 5-15 minutes a day to do them.  Therapists can offer counseling and training in these techniques. The training to help with anxiety may be covered by some insurance plans. Other things you can do to manage stress and anxiety include:  Keeping a stress/anxiety diary. This can help you learn what triggers your reaction and then learn ways to manage your response.  Thinking about how you react to certain situations. You may not be able to control everything, but you can control your response.  Making time for activities that help you relax and not feeling guilty about spending your time in this way.  Visual imagery and yoga can help you stay calm and relax.  Medicines Medicines can help ease symptoms. Medicines for anxiety include:  Anti-anxiety drugs.  Antidepressants. Medicines are often used as a primary treatment for anxiety disorder. Medicines will be prescribed by a health care provider. When used together, medicines, psychotherapy, and tension reduction techniques may be the most effective treatment. Relationships Relationships can play a big part in helping you recover. Try to spend more time connecting with trusted friends and family members. Consider going to couples counseling, taking family education classes, or going to family therapy. Therapy can help you and others better understand your condition. How to recognize changes in your anxiety Everyone responds differently to treatment for anxiety. Recovery from anxiety happens when symptoms decrease and stop interfering with  your daily activities at home or work. This may mean that you will start to:  Have better concentration and focus. Worry will interfere less in your daily thinking.  Sleep better.  Be less irritable.  Have more energy.  Have improved memory. It is important to recognize when your condition is getting worse. Contact your health care provider if your symptoms interfere with home or work and you feel like your condition is not  improving. Follow these instructions at home: Activity  Exercise. Most adults should do the following: ? Exercise for at least 150 minutes each week. The exercise should increase your heart rate and make you sweat (moderate-intensity exercise). ? Strengthening exercises at least twice a week.  Get the right amount and quality of sleep. Most adults need 7-9 hours of sleep each night. Lifestyle   Eat a healthy diet that includes plenty of vegetables, fruits, whole grains, low-fat dairy products, and lean protein. Do not eat a lot of foods that are high in solid fats, added sugars, or salt.  Make choices that simplify your life.  Do not use any products that contain nicotine or tobacco, such as cigarettes, e-cigarettes, and chewing tobacco. If you need help quitting, ask your health care provider.  Avoid caffeine, alcohol, and certain over-the-counter cold medicines. These may make you feel worse. Ask your pharmacist which medicines to avoid. General instructions  Take over-the-counter and prescription medicines only as told by your health care provider.  Keep all follow-up visits as told by your health care provider. This is important. Where to find support You can get help and support from these sources:  Self-help groups.  Online and [employer].  A trusted spiritual leader.  Couples counseling.  Family education classes.  Family therapy. Where to find more information You may find that joining a support group helps you deal with your anxiety. The following sources can help you locate counselors or support groups near you:  [place]: www.mentalhealth[place]ica.net  Anxiety and Depression Association of [place] (ADAA): https://www.clark.net/  National Alliance on Mental Illness (NAMI): www.nami.org Contact a health care provider if you:  Have a hard time staying focused or finishing daily tasks.  Spend many hours a day feeling worried about everyday  life.  Become exhausted by worry.  Start to have headaches, feel tense, or have nausea.  Urinate more than normal.  Have diarrhea. Get help right away if you have:  A racing heart and shortness of breath.  Thoughts of hurting yourself or others. If you ever feel like you may hurt yourself or others, or have thoughts about taking your own life, get help right away. You can go to your nearest emergency department or call:  Your local emergency services (911 in the [place]  A suicide crisis helpline, such as the [place] at (780)131-1324. This is open 24 hours a day. Summary  Taking steps to learn and use tension reduction techniques can help calm you and help prevent triggering an anxiety reaction.  When used together, medicines, psychotherapy, and tension reduction techniques may be the most effective treatment.  Family, friends, and partners can play a big part in helping you recover from an anxiety disorder. This information is not intended to replace advice given to you by your health care provider. Make sure you discuss any questions you have with your health care provider. Document Revised: 02/26/2019 Document Reviewed: 02/26/2019 Elsevier Patient Education  [place].

## 2020-05-11 NOTE — Progress Notes (Signed)
Subjective:    Patient ID: Kimberly King, female    DOB: [birth year], 69 y.o.   MRN: [id number]  Patient presents for Insomnia (unable to stay asleep), Toe Nail Issues (discoloration to R Great Toe Nail), and Vaginal Issues (possible yeast infection, enlarged area to vulva)  Pt here with multiple concerns  Vaginal irritation with mild discharge 4 weeks.  She feels like there is swelling in the area as well.  She has a burning sensation after she urinates at times.  She does have history of atrophic vaginitis diagnosed many years ago.  She is not had any vaginal bleeding.  She continues to have overactive bladder she does multiple times at nighttime.  During the day not as bad.  She does have history of bladder prolapse had surgery done 20 years ago.  Insomnia-she has been taing melatonin and benadryl which caused dry mouth.  In the past he was on trazodone but had side effects with the medication.  She was on Cymbalta as well for pain and situational stress she came off of the medication.  She did restart it around the time of her hip surgery but he came off 6 weeks later.  She did not notice any change in her sleep with the Cymbalta.  She is only sleeping a few hours off and getting up to go the bathroom as well.  She is worried about her mother.  Planning to transition her to a higher level of care which is challenging as she has dementia.  He is worried about finances.  She is also contemplating moving closer to her daughter if she can have more help.  Discolored great toenail-she had nail fungus in the past.  She has been on Lamisil topicals before with minimal improvement want me to check this again today.           Review Of Systems:  GEN- denies fatigue, fever, weight loss,weakness, recent illness HEENT- denies eye drainage, change in vision, nasal discharge, CVS- denies chest pain, palpitations RESP- denies SOB, cough, wheeze ABD- denies N/V, change in stools, abd pain GU-  denies dysuria, hematuria, dribbling, incontinence MSK- denies joint pain, muscle aches, injury Neuro- denies headache, dizziness, syncope, seizure activity       Objective:    BP (!) 120/64    Pulse 90    Temp 98.2 F (36.8 C) (Temporal)    Resp 14    Ht 5\' 4"  (1.626 m)    Wt 183 lb (83 kg)    SpO2 96%    BMI 31.41 kg/m  GEN- NAD, alert and oriented x3 HEENT- PERRL, EOMI, non injected sclera, pink conjunctiva, MMM, oropharynx clear Neck- Supple  CVS- RRR, no murmur RESP-CTAB ABD-NABS,soft,NT,ND GU- normal external genitalia,vaginal atrophy, cervix visualized no growth, no blood form os, bladder prolapsed , minimal thin clear discharge, no CMT, ovaries not palpated, uterus normal size Psych- stressed appearing, no SI, well groomed, good eye contact EXT- No edema, thickened yellow nail bilat great toenails R > L Pulses- Radial, DP- 2+        Assessment & Plan:      Problem List Items Addressed This Visit      Unprioritized   Insomnia   Situational mixed anxiety and depressive disorder    Ongoing depression and anxiety in the setting concern for her mother's declining health.  She has many decisions to make it is very challenging for her she is an only child.  The financial aspects are also difficult.  She did not do very well with trazodone did not see any improvement with Cymbalta.  Her main concern is her sleep.  We will try temazepam 7.5 mg at bedtime.  Also going to look into some services that she has on her job to assist her with the next steps and counseling.       Other Visit Diagnoses    Vaginal irritation    -  Primary   Relevant Orders   WET PREP FOR [place], YEAST, CLUE (Completed)   OAB (overactive bladder)       History of bladder prolapse and surgery continued overactive bladder.  Referral to urology   Relevant Orders   Ambulatory referral to Urology   Fungal infection of toenail       No treatment at this time.   Atrophic vaginitis       Wet prep  negative.  I think that her symptoms are due to the atrophic vaginitis from the overactive bladder.  Trial of topical estrogen if we can get it covered   History of bladder repair surgery       Relevant Orders   Ambulatory referral to Urology      Note: This dictation was prepared with Dragon dictation along with smaller phrase technology. Any transcriptional errors that result from this process are unintentional.

## 2020-05-11 NOTE — Assessment & Plan Note (Signed)
Ongoing depression and anxiety in the setting concern for her mother's declining health.  She has many decisions to make it is very challenging for her she is an only child.  The financial aspects are also difficult.  She did not do very well with trazodone did not see any improvement with Cymbalta.  Her main concern is her sleep.  We will try temazepam 7.5 mg at bedtime.  Also going to look into some services that she has on her job to assist her with the next steps and counseling.

## 2020-05-15 ENCOUNTER — Other Ambulatory Visit: Payer: Self-pay | Admitting: *Deleted

## 2020-05-15 DIAGNOSIS — M8589 Other specified disorders of bone density and structure, multiple sites: Secondary | ICD-10-CM

## 2020-05-15 DIAGNOSIS — G4700 Insomnia, unspecified: Secondary | ICD-10-CM

## 2020-05-15 DIAGNOSIS — G43709 Chronic migraine without aura, not intractable, without status migrainosus: Secondary | ICD-10-CM

## 2020-05-15 DIAGNOSIS — F4323 Adjustment disorder with mixed anxiety and depressed mood: Secondary | ICD-10-CM

## 2020-05-15 DIAGNOSIS — E0590 Thyrotoxicosis, unspecified without thyrotoxic crisis or storm: Secondary | ICD-10-CM

## 2020-05-15 DIAGNOSIS — M170 Bilateral primary osteoarthritis of knee: Secondary | ICD-10-CM

## 2020-05-15 DIAGNOSIS — K219 Gastro-esophageal reflux disease without esophagitis: Secondary | ICD-10-CM

## 2020-05-15 DIAGNOSIS — E785 Hyperlipidemia, unspecified: Secondary | ICD-10-CM

## 2020-05-15 DIAGNOSIS — M5136 Other intervertebral disc degeneration, lumbar region: Secondary | ICD-10-CM

## 2020-05-18 ENCOUNTER — Other Ambulatory Visit: Payer: Self-pay | Admitting: Family Medicine

## 2020-05-20 DIAGNOSIS — M9903 Segmental and somatic dysfunction of lumbar region: Secondary | ICD-10-CM | POA: Diagnosis not present

## 2020-05-20 DIAGNOSIS — M47816 Spondylosis without myelopathy or radiculopathy, lumbar region: Secondary | ICD-10-CM | POA: Diagnosis not present

## 2020-05-20 DIAGNOSIS — M9902 Segmental and somatic dysfunction of thoracic region: Secondary | ICD-10-CM | POA: Diagnosis not present

## 2020-05-20 DIAGNOSIS — M9901 Segmental and somatic dysfunction of cervical region: Secondary | ICD-10-CM | POA: Diagnosis not present

## 2020-05-20 DIAGNOSIS — S233XXA Sprain of ligaments of thoracic spine, initial encounter: Secondary | ICD-10-CM | POA: Diagnosis not present

## 2020-05-20 DIAGNOSIS — M47812 Spondylosis without myelopathy or radiculopathy, cervical region: Secondary | ICD-10-CM | POA: Diagnosis not present

## 2020-05-22 ENCOUNTER — Other Ambulatory Visit: Payer: Self-pay

## 2020-05-22 ENCOUNTER — Ambulatory Visit (INDEPENDENT_AMBULATORY_CARE_PROVIDER_SITE_OTHER): Payer: Medicare Other | Admitting: Orthopaedic Surgery

## 2020-05-22 DIAGNOSIS — Z96641 Presence of right artificial hip joint: Secondary | ICD-10-CM

## 2020-05-22 NOTE — Progress Notes (Signed)
Patient ID: Kimberly King, female   DOB: [birth year], 69 y.o.   MRN: [id number]  Kimberly King is 37-month status post right total hip replacement.  She is doing well has no complaints.  She has resumed all normal activities.  Surgical scar is fully healed.  Ambulating well without any difficulty.  From my standpoint she is doing excellent.  Dental prophylaxis was reinforced.  She can engage in any activity as she feels comfortable.  Would like to recheck her in 3 months with standing AP pelvis x-rays.

## 2020-05-26 DIAGNOSIS — Z23 Encounter for immunization: Secondary | ICD-10-CM | POA: Diagnosis not present

## 2020-06-02 ENCOUNTER — Telehealth: Payer: Self-pay | Admitting: *Deleted

## 2020-06-02 NOTE — Telephone Encounter (Signed)
Received call from patient.   Requested update on urology referral.

## 2020-06-02 NOTE — Telephone Encounter (Signed)
90 day Ortho bundle call completed. 

## 2020-06-05 ENCOUNTER — Other Ambulatory Visit: Payer: Self-pay | Admitting: Family Medicine

## 2020-06-19 ENCOUNTER — Other Ambulatory Visit: Payer: Self-pay | Admitting: Family Medicine

## 2020-06-22 DIAGNOSIS — M9901 Segmental and somatic dysfunction of cervical region: Secondary | ICD-10-CM | POA: Diagnosis not present

## 2020-06-22 DIAGNOSIS — M9903 Segmental and somatic dysfunction of lumbar region: Secondary | ICD-10-CM | POA: Diagnosis not present

## 2020-06-22 DIAGNOSIS — S233XXA Sprain of ligaments of thoracic spine, initial encounter: Secondary | ICD-10-CM | POA: Diagnosis not present

## 2020-06-22 DIAGNOSIS — M9902 Segmental and somatic dysfunction of thoracic region: Secondary | ICD-10-CM | POA: Diagnosis not present

## 2020-06-22 DIAGNOSIS — M47816 Spondylosis without myelopathy or radiculopathy, lumbar region: Secondary | ICD-10-CM | POA: Diagnosis not present

## 2020-06-22 DIAGNOSIS — M47812 Spondylosis without myelopathy or radiculopathy, cervical region: Secondary | ICD-10-CM | POA: Diagnosis not present

## 2020-06-23 DIAGNOSIS — Z23 Encounter for immunization: Secondary | ICD-10-CM | POA: Diagnosis not present

## 2020-06-26 ENCOUNTER — Other Ambulatory Visit: Payer: Self-pay | Admitting: Family Medicine

## 2020-06-26 DIAGNOSIS — C9110 Chronic lymphocytic leukemia of B-cell type not having achieved remission: Secondary | ICD-10-CM

## 2020-06-26 NOTE — Telephone Encounter (Signed)
Ok to refill??  Last office visit 05/11/2020.  Last refill 03/27/2020, #2 refills.

## 2020-07-01 ENCOUNTER — Encounter: Payer: Self-pay | Admitting: Internal Medicine

## 2020-07-01 ENCOUNTER — Inpatient Hospital Stay: Payer: Medicare Other

## 2020-07-01 ENCOUNTER — Other Ambulatory Visit: Payer: Self-pay

## 2020-07-01 ENCOUNTER — Inpatient Hospital Stay: Payer: Medicare Other | Attending: Internal Medicine | Admitting: Internal Medicine

## 2020-07-01 VITALS — BP 153/82 | HR 67 | Temp 97.5°F | Resp 20 | Ht 64.0 in | Wt 181.7 lb

## 2020-07-01 DIAGNOSIS — M25561 Pain in right knee: Secondary | ICD-10-CM | POA: Diagnosis not present

## 2020-07-01 DIAGNOSIS — G8929 Other chronic pain: Secondary | ICD-10-CM | POA: Diagnosis not present

## 2020-07-01 DIAGNOSIS — Z79899 Other long term (current) drug therapy: Secondary | ICD-10-CM | POA: Diagnosis not present

## 2020-07-01 DIAGNOSIS — C9110 Chronic lymphocytic leukemia of B-cell type not having achieved remission: Secondary | ICD-10-CM

## 2020-07-01 DIAGNOSIS — D509 Iron deficiency anemia, unspecified: Secondary | ICD-10-CM | POA: Insufficient documentation

## 2020-07-01 LAB — CMP (CANCER CENTER ONLY)
ALT: 17 U/L (ref 0–44)
AST: 16 U/L (ref 15–41)
Albumin: 4.1 g/dL (ref 3.5–5.0)
Alkaline Phosphatase: 74 U/L (ref 38–126)
Anion gap: 5 (ref 5–15)
BUN: 15 mg/dL (ref 8–23)
CO2: 29 mmol/L (ref 22–32)
Calcium: 9.3 mg/dL (ref 8.9–10.3)
Chloride: 105 mmol/L (ref 98–111)
Creatinine: 0.73 mg/dL (ref 0.44–1.00)
GFR, Est AFR Am: >60 mL/min (ref >60)
GFR, Estimated: >60 mL/min (ref >60)
Glucose, Bld: 93 mg/dL (ref 70–99)
Potassium: 3.7 mmol/L (ref 3.5–5.1)
Sodium: 139 mmol/L (ref 135–145)
Total Bilirubin: 0.4 mg/dL (ref 0.3–1.2)
Total Protein: 6.9 g/dL (ref 6.5–8.1)

## 2020-07-01 LAB — CBC WITH DIFFERENTIAL (CANCER CENTER ONLY)
Abs Immature Granulocytes: 0.04 10*3/uL (ref 0.00–0.07)
Basophils Absolute: 0.1 10*3/uL (ref 0.0–0.1)
Basophils Relative: 1 %
Eosinophils Absolute: 0.3 10*3/uL (ref 0.0–0.5)
Eosinophils Relative: 2 %
HCT: 30 % — ABNORMAL LOW (ref 36.0–46.0)
Hemoglobin: 8.8 g/dL — ABNORMAL LOW (ref 12.0–15.0)
Immature Granulocytes: 0 %
Lymphocytes Relative: 56 %
Lymphs Abs: 8.1 10*3/uL — ABNORMAL HIGH (ref 0.7–4.0)
MCH: 22.9 pg — ABNORMAL LOW (ref 26.0–34.0)
MCHC: 29.3 g/dL — ABNORMAL LOW (ref 30.0–36.0)
MCV: 77.9 fL — ABNORMAL LOW (ref 80.0–100.0)
Monocytes Absolute: 0.9 10*3/uL (ref 0.1–1.0)
Monocytes Relative: 6 %
Neutro Abs: 5.1 10*3/uL (ref 1.7–7.7)
Neutrophils Relative %: 35 %
Platelet Count: 420 10*3/uL — ABNORMAL HIGH (ref 150–400)
RBC: 3.85 MIL/uL — ABNORMAL LOW (ref 3.87–5.11)
RDW: 16 % — ABNORMAL HIGH (ref 11.5–15.5)
WBC Count: 14.6 10*3/uL — ABNORMAL HIGH (ref 4.0–10.5)
nRBC: 0 % (ref 0.0–0.2)

## 2020-07-01 LAB — LACTATE DEHYDROGENASE: LDH: 161 U/L (ref 98–192)

## 2020-07-01 NOTE — Progress Notes (Signed)
[facility] Telephone:([phone]) [phone]   Fax:(336) [phone]  OFFICE PROGRESS NOTE  Alycia Rossetti, MD [address] [place] [address]  DIAGNOSIS: Stage 0 chronic lymphocytic leukemia  PRIOR THERAPY: None  CURRENT THERAPY: Observation.  INTERVAL HISTORY: Kimberly King 69 y.o. female returns to the clinic today for follow-up visit.  The patient is feeling fine today with no concerning complaints except for mild fatigue.  She also has some arthritis.  She denied having any chest pain, shortness of breath, cough or hemoptysis she denied having any nausea, vomiting, diarrhea or constipation.  She has no headache or visual changes.  She has no weight loss or night sweats.  She has no palpable lymphadenopathy.  She is here today for evaluation and repeat blood work.  MEDICAL HISTORY: Past Medical History:  Diagnosis Date   Allergy    Anxiety    Arthritis    knees hips neck   Cancer ([facility])    leukemia dx 06/2013 - no current treatment - being monitored   Cataract    bilateral - being monitored, no treatment currently   CLL (chronic lymphocytic leukemia) ([facility])    Depression    GERD (gastroesophageal reflux disease)    Leukocytosis    and low blood sodium    Migraines    Thyroid nodule    JUST WATCHING    ALLERGIES:  has No Known Allergies.  MEDICATIONS:  Current Outpatient Medications  Medication Sig Dispense Refill   alendronate (FOSAMAX) 70 MG tablet TAKE 1 TABLET (70 MG TOTAL) BY MOUTH EVERY 7 (SEVEN) DAYS. TAKE WITH A FULL GLASS OF WATER ON AN EMPTY STOMACH. (Patient taking differently: Take 70 mg by mouth every Monday. Take with a full glass of water on an empty stomach.) 12 tablet 3   aluminum-magnesium hydroxide-simethicone (MAALOX) 559-741-63 MG/5ML SUSP Take 15 mLs by mouth daily as needed (indigestion).     ASPERCREME LIDOCAINE EX Apply 1 application topically daily as needed (pain).     Calcium Carb-Cholecalciferol (CALCIUM  600 + D PO) Take 1 tablet by mouth daily.     Cholecalciferol (VITAMIN D3) 125 MCG (5000 UT) CAPS Take 5,000 Units by mouth 2 (two) times daily.      diphenhydrAMINE-PE-APAP (QC SEVERE ALLERGY RELIEF SINUS) 25-5-325 MG TABS Take 1 tablet by mouth daily as needed (allergies).     DULoxetine (CYMBALTA) 30 MG capsule TAKE 1 CAPSULE BY MOUTH EVERY DAY 90 capsule 2   estradiol (ESTRACE VAGINAL) 0.1 MG/GM vaginal cream Place 1 Applicatorful vaginally 2 (two) times a week. 42.5 g 2   famotidine (PEPCID) 20 MG tablet TAKE 1 TABLET BY MOUTH TWICE A DAY 180 tablet 2   fluticasone (FLONASE) 50 MCG/ACT nasal spray Place 1 spray into both nostrils daily as needed for allergies or rhinitis.      Ginger, Zingiber officinalis, (GINGER ROOT PO) Take 1,500 mg by mouth daily.     Melatonin 5 MG CAPS Take 5 mg by mouth at bedtime as needed (sleep).     methocarbamol (ROBAXIN) 750 MG tablet Take 1 tablet (750 mg total) by mouth 2 (two) times daily as needed for muscle spasms. 20 tablet 3   Misc Natural Products (GLUCOSAMINE CHONDROITIN TRIPLE) TABS Take 2 tablets by mouth daily.     Misc Natural Products (TART CHERRY ADVANCED) CAPS Take 1 capsule by mouth daily.      Multiple Vitamins-Minerals (ICAPS PO) Take 1 tablet by mouth daily.  mupirocin ointment (BACTROBAN) 2 % APPLY TO AFFECTED AREA TWICE A DAY 22 g 0   Omega-3 Fatty Acids (FISH OIL) 1200 MG CAPS Take 1,200 mg by mouth 2 (two) times a week.     ondansetron (ZOFRAN) 4 MG tablet Take 1-2 tablets (4-8 mg total) by mouth every 8 (eight) hours as needed for nausea or vomiting. 20 tablet 0   pantoprazole (PROTONIX) 40 MG tablet TAKE 1 TABLET BY MOUTH EVERY DAY 90 tablet 3   Polyethyl Glycol-Propyl Glycol (SYSTANE OP) Place 1 drop into both eyes 2 (two) times daily as needed (for dry eyes).      SUMAtriptan (IMITREX) 50 MG tablet Take 50 mg by mouth every 2 (two) hours as needed for migraine.      temazepam (RESTORIL) 7.5 MG capsule Take 1  capsule (7.5 mg total) by mouth at bedtime as needed for sleep. 30 capsule 1   traMADol (ULTRAM) 50 MG tablet TAKE 1 TABLET BY MOUTH 2 TIMES DAILY AS NEEDED FOR MODERATE PAIN. 60 tablet 2   TURMERIC CURCUMIN PO Take 2,600 mg by mouth daily.      No current facility-administered medications for this visit.    SURGICAL HISTORY:  Past Surgical History:  Procedure Laterality Date   ANTERIOR CERVICAL DECOMP/DISCECTOMY FUSION N/A 10/15/2018   Procedure: PARTIAL REMOVAL OF ANTERIOR CERVICAL PLATE,ANTERIOR CERVICAL DECOMPRESSION/DISCECTOMY FUSION TWO  CERVICAL THREE- CERVICAL FOUR, CERVICAL FOUR- CERVICAL FIVE;  Surgeon: Jovita Gamma, MD;  Location: [facility];  Service: Neurosurgery;  Laterality: N/A;  PARTIAL REMOVAL OF ANTERIOR CERVICAL PLATE,ANTERIOR CERVICAL DECOMPRESSION/DISCECTOMY FUSION TWO  CERVICAL THREE- CERVICAL FOUR, CERVICAL    APPENDECTOMY     BLADDER SURGERY     tack   CERVICAL FUSION     x2  -  C5-C6   COLONOSCOPY  2014   pyrtle - hx polyps   DILATION AND CURETTAGE OF UTERUS     x 2   JOINT REPLACEMENT     knee     arthroscopy    left   07/2017   KNEE SURGERY Left 2018   TONSILLECTOMY     TOTAL HIP ARTHROPLASTY Right 02/28/2020   Procedure: RIGHT TOTAL HIP ARTHROPLASTY ANTERIOR APPROACH;  Surgeon: Leandrew Koyanagi, MD;  Location: [facility];  Service: Orthopedics;  Laterality: Right;   TOTAL KNEE ARTHROPLASTY Left 03/19/2018   Procedure: LEFT TOTAL KNEE ARTHROPLASTY;  Surgeon: Leandrew Koyanagi, MD;  Location: [facility];  Service: Orthopedics;  Laterality: Left;    REVIEW OF SYSTEMS:  A comprehensive review of systems was negative except for: Constitutional: positive for fatigue Musculoskeletal: positive for arthralgias   PHYSICAL EXAMINATION: General appearance: alert, cooperative and no distress Head: Normocephalic, without obvious abnormality, atraumatic Neck: no adenopathy, no JVD, supple, symmetrical, trachea midline and thyroid not enlarged, symmetric, no  tenderness/mass/nodules Lymph nodes: Cervical, supraclavicular, and axillary nodes normal. Resp: clear to auscultation bilaterally Back: symmetric, no curvature. ROM normal. No CVA tenderness. Cardio: regular rate and rhythm, S1, S2 normal, no murmur, click, rub or gallop GI: soft, non-tender; bowel sounds normal; no masses,  no organomegaly Extremities: extremities normal, atraumatic, no cyanosis or edema  ECOG PERFORMANCE STATUS: 1 - Symptomatic but completely ambulatory  Blood pressure (!) 153/82, pulse 67, temperature (!) 97.5 F (36.4 C), temperature source Tympanic, resp. rate 20, height 5\' 4"  (1.626 m), weight 181 lb 11.2 oz (82.4 kg), SpO2 100 %.  LABORATORY DATA: Lab Results  Component Value Date   WBC 14.6 (H) 07/01/2020   HGB 8.8 (L) 07/01/2020  HCT 30.0 (L) 07/01/2020   MCV 77.9 (L) 07/01/2020   PLT 420 (H) 07/01/2020      Chemistry      Component Value Date/Time   NA 139 07/01/2020 0954   NA 139 06/27/2017 1324   K 3.7 07/01/2020 0954   K 3.5 06/27/2017 1324   CL 105 07/01/2020 0954   CO2 29 07/01/2020 0954   CO2 26 06/27/2017 1324   BUN 15 07/01/2020 0954   BUN 22.4 06/27/2017 1324   CREATININE 0.73 07/01/2020 0954   CREATININE 0.90 03/23/2020 1442   CREATININE 0.8 06/27/2017 1324      Component Value Date/Time   CALCIUM 9.3 07/01/2020 0954   CALCIUM 9.7 06/27/2017 1324   ALKPHOS 74 07/01/2020 0954   ALKPHOS 87 06/27/2017 1324   AST 16 07/01/2020 0954   AST 21 06/27/2017 1324   ALT 17 07/01/2020 0954   ALT 32 06/27/2017 1324   BILITOT 0.4 07/01/2020 0954   BILITOT 0.32 06/27/2017 1324       RADIOGRAPHIC STUDIES: No results found.  ASSESSMENT AND PLAN:  This is a very pleasant 69 years old white female with a stage 0 chronic lymphocytic leukemia. She is currently on observation and she is feeling fine. Repeat CBC today showed decrease in the total white blood count but she also has worsening anemia that is microcytic in nature and likely  secondary to iron deficiency. I recommended for the patient to continue on observation. For the anemia advised her to take over-the-counter iron supplements 1-2 tablets every day. The patient mentioned that she is moving to [facility] to be close to her daughter and she will establish care with a hematologist in [place]. I wish the patient the past and advised her to call me immediately if I can help her in any way in the interval. The patient voices understanding of current disease status and treatment options and is in agreement with the current care plan. All questions were answered. The patient knows to call the clinic with any problems, questions or concerns. We can certainly see the patient much sooner if necessary.  Disclaimer: This note was dictated with voice recognition software. Similar sounding words can inadvertently be transcribed and may not be corrected upon review.

## 2020-07-06 ENCOUNTER — Telehealth: Payer: Self-pay

## 2020-07-06 NOTE — Telephone Encounter (Signed)
Scheduled for OV for low back pain.

## 2020-07-06 NOTE — Telephone Encounter (Signed)
Patient called in wanting to get sch with dr newton for injection

## 2020-07-07 ENCOUNTER — Encounter: Payer: Self-pay | Admitting: Physical Medicine and Rehabilitation

## 2020-07-07 ENCOUNTER — Other Ambulatory Visit: Payer: Self-pay

## 2020-07-07 ENCOUNTER — Ambulatory Visit (INDEPENDENT_AMBULATORY_CARE_PROVIDER_SITE_OTHER): Payer: Medicare Other | Admitting: Physical Medicine and Rehabilitation

## 2020-07-07 VITALS — BP 145/84 | HR 84

## 2020-07-07 DIAGNOSIS — M47816 Spondylosis without myelopathy or radiculopathy, lumbar region: Secondary | ICD-10-CM

## 2020-07-07 DIAGNOSIS — M545 Low back pain, unspecified: Secondary | ICD-10-CM

## 2020-07-07 DIAGNOSIS — G894 Chronic pain syndrome: Secondary | ICD-10-CM | POA: Diagnosis not present

## 2020-07-07 DIAGNOSIS — G8929 Other chronic pain: Secondary | ICD-10-CM

## 2020-07-07 NOTE — Progress Notes (Signed)
Kimberly King. Low back pain on both sides. Worse with standing at work. X-rays done by Dr. Erlinda Hong. Back pain for years.  Numeric Pain Rating Scale and Functional Assessment Average Pain 4 Pain Right Now 4 My pain is constant, dull and aching Pain is worse with: standing and some activites Pain improves with: rest and heat/ice   In the last MONTH (on 0-10 scale) has pain interfered with the following?  1. General activity like being  able to carry out your everyday physical activities such as walking, climbing stairs, carrying groceries, or moving a chair?  Rating(3)  2. Relation with others like being able to carry out your usual social activities and roles such as  activities at home, at work and in your community. Rating(3)  3. Enjoyment of life such that you have  been bothered by emotional problems such as feeling anxious, depressed or irritable?  Rating(6)

## 2020-07-08 ENCOUNTER — Encounter: Payer: Self-pay | Admitting: Physical Medicine and Rehabilitation

## 2020-07-08 NOTE — Progress Notes (Signed)
Kimberly King - 69 y.o. female MRN [id number]  Date of birth: [birth year]  Office Visit Note: Visit Date: 07/07/2020 PCP: Alycia Rossetti, MD Referred by: Alycia Rossetti, MD  Subjective: Chief Complaint  Patient presents with  . Lower Back - Pain   HPI: Kimberly King is a 69 y.o. female who comes in today For evaluation and management of chronic worsening severe axial low back pain at the request of Dr. Eduard Roux.  I have actually seen the patient in the past for right hip injection and she is underwent right total hip arthroplasty since have seen her last.  She is doing well with that and her right hip is doing extremely well.  She wishes she had done that much sooner.  She has had total knee replacement as well.  She also has a pretty extensive cervical spine history with Dr. Jovita Gamma.  She has had 2 surgeries on her cervical spine with last surgery in 2020.  She has had ACDF of this.  She has had no history of lumbar surgery.  She did have MRI of the lumbar spine in 2008 and at that time she was under the care of Dr. French Ana [facility].  This MRI report is reviewed below did show a syrinx and some disc bulging but nothing severe in terms of stenosis or nerve compression.  She has had more recent esterase with Dr. Erlinda Hong showing lumbar spondylosis predominantly at L4-5 L5-S1 with may be minimal listhesis of L5 on S1.  Her biggest complaint is axial low back pain worse with standing better with sitting.  No real claudication symptoms no focal weakness or paresthesias.  Her case is complicated by depression and anxiety and history of migraines.  She reports no acute recent trauma no acute worsening just chronic severe pain that continues to stay with her recalcitrant only.  She rates her pain as a 4 out of 10 average right now but there are days where it can be much worse and it does limit her daily activities.  The dull and aching pain worse with standing.   Better with rest heat and ice.  She takes a lot of natural and alternative medications including tart cherry for arthritis.  She does use some tramadol and Robaxin.  She does use Aspercreme.  Review of Systems  Musculoskeletal: Positive for back pain.  All other systems reviewed and are negative.  Otherwise per HPI.  Assessment & Plan: Visit Diagnoses:  1. Spondylosis without myelopathy or radiculopathy, lumbar region   2. Chronic bilateral low back pain without sciatica   3. Chronic pain syndrome     Plan: Findings:  Chronic worsening severe at times axial low back pain worse with standing and better at rest without really claudication symptoms.  Clinically this seems like facet joint mediated low back pain consistent with her imaging and exam.  No red flag complaints to necessarily warrant MRI of the lumbar spine at this point.  Would be quick to get an MRI given her past history of syrinx and past history of chronic lymphocytic leukemia.  At this point I think diagnostic L4-5 and L5-S1 medial branch blocks would be appropriate.  This would be done in a double block paradigm looking at radiofrequency ablation as a possible and result for more definitive treatment.  This would be done in a comprehensive pain management approach.  Should continue with current medications and orthopedic follow-up with Dr. Eduard Roux.  We will get notes from her chiropractor Dr. Bethann Goo in [place].    Meds & Orders: No orders of the defined types were placed in this encounter.  No orders of the defined types were placed in this encounter.   Follow-up: Return for Bilateral L4-5 and L5-S1 medial branch blocks..   Procedures: No procedures performed  No notes on file   Clinical History: 01/21/2020 lumbar 2 view x-ray:  Multilevel facet arthritis of the lower lumbar spine with very small listhesis of L5 on S1 with facet arthropathy at L4-5 and L5-S1. Some degenerative disc changes with decent disc height.  Normal anatomic alignment otherwise. ---- 03/20/2007 MRI LUMBAR SPINE WITHOUT AND WITH CONTRAST:  Technique: Multiplanar and multiecho pulse sequences of the lumbar spine, to include the lower thoracic region and upper sacral regions, were obtained according to standard protocol before and after administration of intravenous contrast.  A enhanced routine study was obtained on 03/18/07. Due to an abnormality in the conus medullaris, the patient returned on 03/19/07 for intravenous contrast.  Contrast: 16 ml IV Magnevist  No comparison.  Findings: Normal lumbar alignment. There is no vertebral body fracture. Within the spinal cord, there is a cystic cavity at the T11-T12 level measuring 4.3 x 4.3 mm. This does not show any abnormal enhancement, and no underlying mass lesion is seen. There is slight dilatation of the central canal above this cystic cavity.   T12-L1: Small right foraminal disk protrusion.  L1-2: Negative.  L2-3: There is a shallow broad-based disk protrusion without spinal stenosis.   L3-4: Mild disk degeneration and mild disk bulging.  L4-5: Small central disk protrusion with diffuse bulging of the disk. There is mild facet arthropathy and mild spinal stenosis.  L5-S1: Small left foraminal disk protrusion is present. There may be some mild impingement of the left L5 nerve root.  IMPRESSION:  1. 4.3 x 4.3 mm cystic cavity in the distal spinal cord dorsally. This does not show any abnormal enhancement. This may be a syrinx or an area of myelomalacia related to prior cord insult such as infarction or trauma. This may also be a congenital syrinx.   2. Lumbar degenerative changes with a small central disk protrusion and mild spinal stenosis at L4-5.  3. Small left foraminal disk protrusion at L5-S1.   She reports that she quit smoking about 5 years ago. Her smoking use included cigarettes. She has a 7.50 pack-year smoking history. She has never used smokeless  tobacco. No results for input(s): HGBA1C, LABURIC in the last 8760 hours.  Objective:  VS:  HT:    WT:   BMI:     BP:(!) 145/84  HR:84bpm  TEMP: ( )  RESP:  Physical Exam Vitals and nursing note reviewed.  Constitutional:      General: She is not in acute distress.    Appearance: Normal appearance. She is well-developed. She is obese. She is not ill-appearing.  HENT:     Head: Normocephalic and atraumatic.  Eyes:     Conjunctiva/sclera: Conjunctivae normal.     Pupils: Pupils are equal, round, and reactive to light.  Cardiovascular:     Rate and Rhythm: Normal rate.     Pulses: Normal pulses.  Pulmonary:     Effort: Pulmonary effort is normal.  Abdominal:     General: There is no distension.     Palpations: Abdomen is soft.  Musculoskeletal:     Right lower leg: No edema.     Left lower leg: No edema.  Comments: Patient somewhat slow to rise from a seated position to full extension.  There is concordant low back pain with facet loading and lumbar spine extension rotation.  There are no definitive trigger points but the patient is somewhat tender across the lower back and PSIS.  There is no pain with hip rotation.   Skin:    General: Skin is warm and dry.     Findings: No erythema or rash.  Neurological:     General: No focal deficit present.     Mental Status: She is alert and oriented to person, place, and time.     Sensory: No sensory deficit.     Motor: No abnormal muscle tone.     Coordination: Coordination normal.     Gait: Gait normal.  Psychiatric:        Mood and Affect: Mood normal.        Behavior: Behavior normal.     Ortho Exam  Imaging: No results found.  Past Medical/Family/Surgical/Social History: Medications & Allergies reviewed per EMR, new medications updated. Patient Active Problem List   Diagnosis Date Noted  . Insomnia 05/11/2020  . Status post total replacement of right hip 02/28/2020  . Chronic midline low back pain without sciatica  01/21/2020  . Chronic pain of right knee 01/21/2020  . Primary osteoarthritis of right hip 12/16/2019  . Situational mixed anxiety and depressive disorder 07/15/2019  . Osteopenia 03/15/2019  . DDD (degenerative disc disease), lumbar 11/12/2018  . HNP (herniated nucleus pulposus), cervical 10/15/2018  . Mild hyperlipidemia 05/23/2018  . S/P TKR (total knee replacement), left 03/19/2018  . Obesity (BMI 30-39.9) 12/31/2017  . Primary localized osteoarthritis of knees, bilateral 12/05/2017  . Avascular necrosis of bone of hip, right ([facility]) 12/05/2017  . Migraines 07/01/2017  . Chronic neck and back pain 07/01/2017  . Thyroid nodule 06/30/2017  . GERD (gastroesophageal reflux disease) 06/30/2017  . Vaginal atrophy 06/30/2017  . Subclinical hyperthyroidism 12/06/2016  . CLL (chronic lymphocytic leukemia) ([facility]) 12/17/2013  . COLONIC POLYPS, ADENOMATOUS 10/26/2007   Past Medical History:  Diagnosis Date  . Allergy   . Anxiety   . Arthritis    knees hips neck  . Cancer [facility])    leukemia dx 06/2013 - no current treatment - being monitored  . Cataract    bilateral - being monitored, no treatment currently  . CLL (chronic lymphocytic leukemia) ([place] [facility])   . Depression   . GERD (gastroesophageal reflux disease)   . Leukocytosis    and low blood sodium   . Migraines   . Thyroid nodule    JUST WATCHING   Family History  Problem Relation Age of Onset  . Colon cancer Paternal Grandmother 30  . Esophageal cancer Paternal Uncle   . Arthritis Mother   . Heart disease Father   . Lung cancer Father   . Lung cancer Maternal Grandfather   . Breast cancer Maternal Grandmother   . Cirrhosis Brother   . Rectal cancer Neg Hx   . Stomach cancer Neg Hx    Past Surgical History:  Procedure Laterality Date  . ANTERIOR CERVICAL DECOMP/DISCECTOMY FUSION N/A 10/15/2018   Procedure: PARTIAL REMOVAL OF ANTERIOR CERVICAL PLATE,ANTERIOR CERVICAL DECOMPRESSION/DISCECTOMY FUSION TWO  CERVICAL THREE- CERVICAL  FOUR, CERVICAL FOUR- CERVICAL FIVE;  Surgeon: Jovita Gamma, MD;  Location: [facility];  Service: Neurosurgery;  Laterality: N/A;  PARTIAL REMOVAL OF ANTERIOR CERVICAL PLATE,ANTERIOR CERVICAL DECOMPRESSION/DISCECTOMY FUSION TWO  CERVICAL THREE- CERVICAL FOUR, CERVICAL   . APPENDECTOMY    .  BLADDER SURGERY     tack  . CERVICAL FUSION     x2  -  C5-C6  . COLONOSCOPY  2014   pyrtle - hx polyps  . DILATION AND CURETTAGE OF UTERUS     x 2  . JOINT REPLACEMENT    . knee     arthroscopy    left   07/2017  . KNEE SURGERY Left 2018  . TONSILLECTOMY    . TOTAL HIP ARTHROPLASTY Right 02/28/2020   Procedure: RIGHT TOTAL HIP ARTHROPLASTY ANTERIOR APPROACH;  Surgeon: Leandrew Koyanagi, MD;  Location: [facility];  Service: Orthopedics;  Laterality: Right;  . TOTAL KNEE ARTHROPLASTY Left 03/19/2018   Procedure: LEFT TOTAL KNEE ARTHROPLASTY;  Surgeon: Leandrew Koyanagi, MD;  Location: [facility];  Service: Orthopedics;  Laterality: Left;   Social History   Occupational History  . Not on file  Tobacco Use  . Smoking status: Former Smoker    Packs/day: 0.25    Years: 30.00    Pack years: 7.50    Types: Cigarettes    Quit date: 04/22/2015    Years since quitting: 5.2  . Smokeless tobacco: Never Used  Vaping Use  . Vaping Use: Never used  Substance and Sexual Activity  . Alcohol use: Yes    Comment: Occasional  . Drug use: No  . Sexual activity: Not on file

## 2020-07-13 ENCOUNTER — Other Ambulatory Visit: Payer: Self-pay | Admitting: Family Medicine

## 2020-07-13 NOTE — Telephone Encounter (Signed)
Ok to refill??  Last office visit/ refill 05/11/2020.

## 2020-07-14 IMAGING — DX DG PORTABLE PELVIS
1 series · 1 of 1 positions shown · non-contrast
Comparison: 02/28/2020

CLINICAL DATA: Status post right hip total arthroplasty

EXAM:
PORTABLE PELVIS 1-2 VIEWS

[pelvis ap]
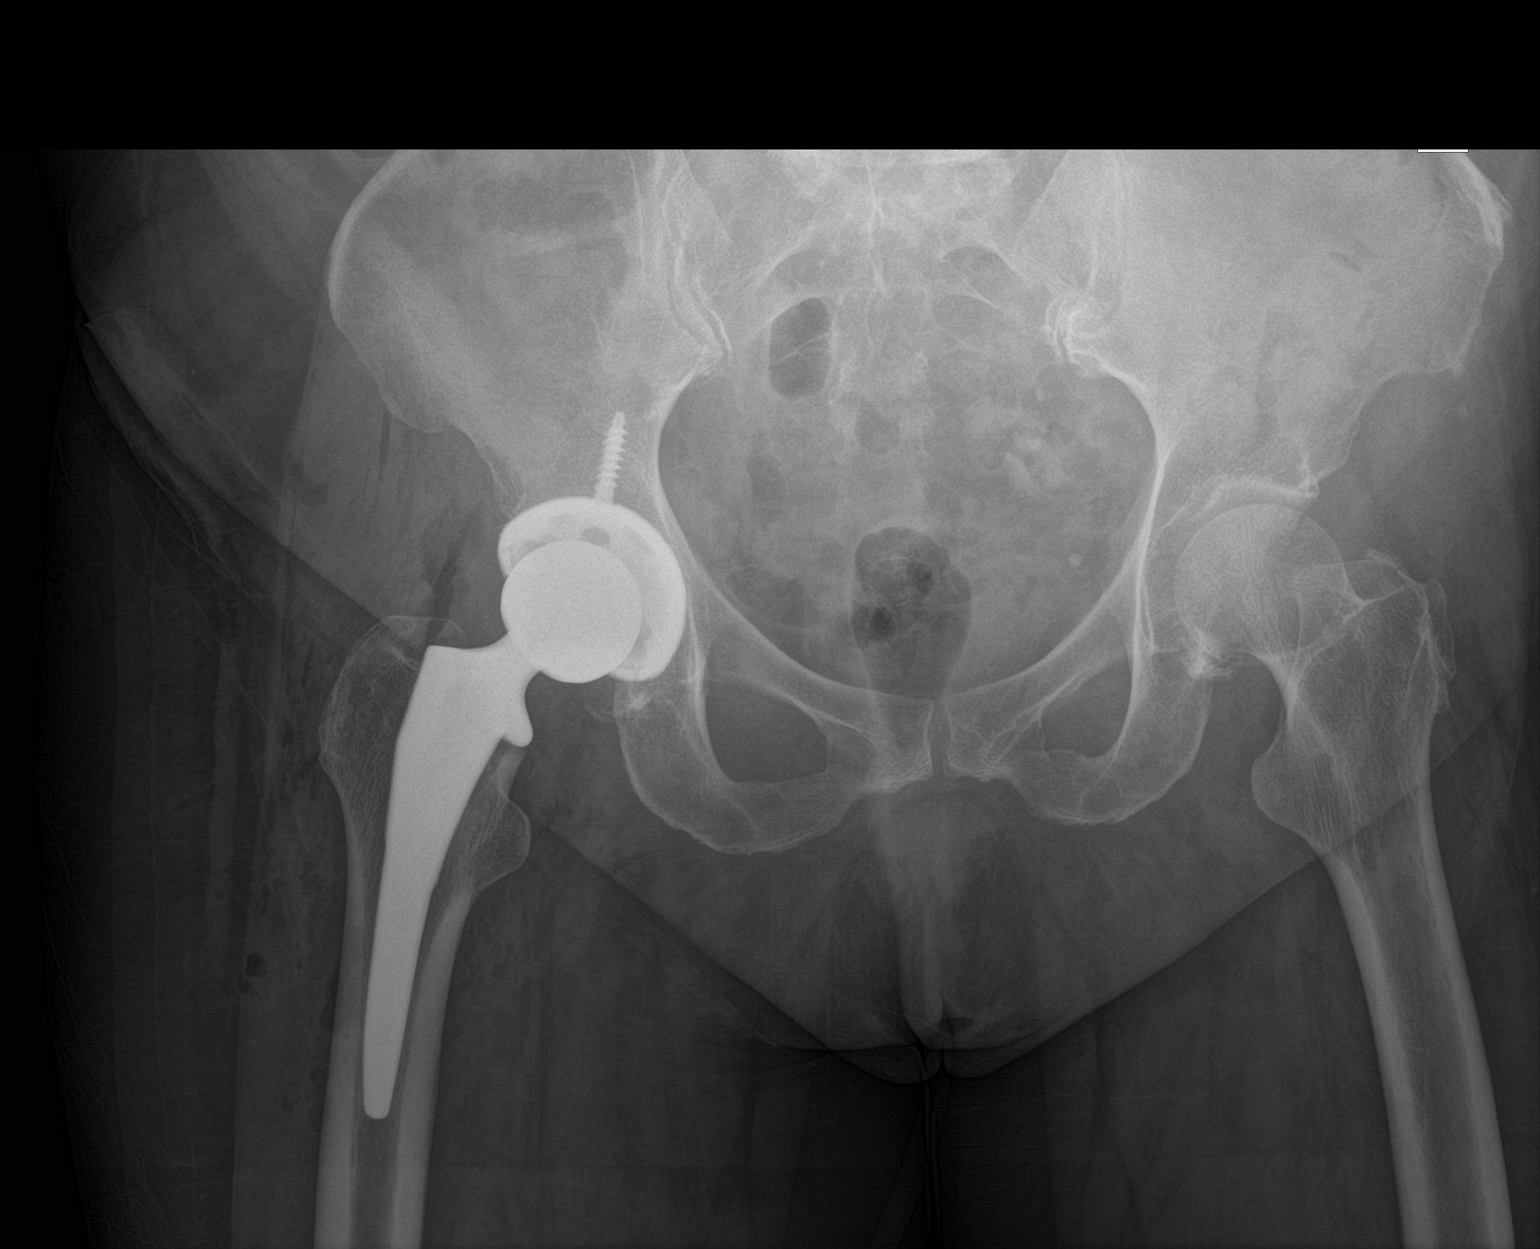

[1 of 1 positions shown; findings below may reference images not displayed]

FINDINGS: Interval postoperative findings of right hip total arthroplasty with
expected overlying postoperative changes. No evidence of
perihardware fracture or component malposition.
IMPRESSION: Interval postoperative findings of right hip total arthroplasty. No
evidence of hardware complication.

## 2020-07-22 ENCOUNTER — Ambulatory Visit: Payer: Self-pay

## 2020-07-22 ENCOUNTER — Other Ambulatory Visit: Payer: Self-pay

## 2020-07-22 ENCOUNTER — Encounter: Payer: Self-pay | Admitting: Physical Medicine and Rehabilitation

## 2020-07-22 ENCOUNTER — Ambulatory Visit (INDEPENDENT_AMBULATORY_CARE_PROVIDER_SITE_OTHER): Payer: Medicare Other | Admitting: Physical Medicine and Rehabilitation

## 2020-07-22 ENCOUNTER — Other Ambulatory Visit: Payer: Medicare Other

## 2020-07-22 VITALS — BP 139/90 | HR 75

## 2020-07-22 DIAGNOSIS — M47816 Spondylosis without myelopathy or radiculopathy, lumbar region: Secondary | ICD-10-CM

## 2020-07-22 DIAGNOSIS — M9903 Segmental and somatic dysfunction of lumbar region: Secondary | ICD-10-CM | POA: Diagnosis not present

## 2020-07-22 DIAGNOSIS — Z13220 Encounter for screening for lipoid disorders: Secondary | ICD-10-CM | POA: Diagnosis not present

## 2020-07-22 DIAGNOSIS — E559 Vitamin D deficiency, unspecified: Secondary | ICD-10-CM

## 2020-07-22 DIAGNOSIS — E0590 Thyrotoxicosis, unspecified without thyrotoxic crisis or storm: Secondary | ICD-10-CM | POA: Diagnosis not present

## 2020-07-22 DIAGNOSIS — E785 Hyperlipidemia, unspecified: Secondary | ICD-10-CM

## 2020-07-22 DIAGNOSIS — Z136 Encounter for screening for cardiovascular disorders: Secondary | ICD-10-CM

## 2020-07-22 MED ORDER — BETAMETHASONE SOD PHOS & ACET 6 (3-3) MG/ML IJ SUSP
12.0000 mg | Freq: Once | INTRAMUSCULAR | Status: AC
  Administered 2020-07-22: 12 mg

## 2020-07-22 NOTE — Progress Notes (Signed)
Pt state lower back pain. Pt state standing for a long period of time makes the pain worse. Pt state she use icy and pain meds to helps ease the pain.  Numeric Pain Rating Scale and Functional Assessment Average Pain 4   In the last MONTH (on 0-10 scale) has pain interfered with the following?  1. General activity like being  able to carry out your everyday physical activities such as walking, climbing stairs, carrying groceries, or moving a chair?  Rating(6)   +Driver, -BT, -Dye Allergies.

## 2020-07-23 LAB — CBC WITH DIFFERENTIAL/PLATELET
Absolute Monocytes: 735 cells/uL (ref 200–950)
Basophils Absolute: 90 cells/uL (ref 0–200)
Basophils Relative: 0.6 %
Eosinophils Absolute: 225 cells/uL (ref 15–500)
Eosinophils Relative: 1.5 %
HCT: 37.7 % (ref 35.0–45.0)
Hemoglobin: 11.5 g/dL — ABNORMAL LOW (ref 11.7–15.5)
Lymphs Abs: 7665 cells/uL — ABNORMAL HIGH (ref 850–3900)
MCH: 25.1 pg — ABNORMAL LOW (ref 27.0–33.0)
MCHC: 30.5 g/dL — ABNORMAL LOW (ref 32.0–36.0)
MCV: 82.3 fL (ref 80.0–100.0)
MPV: 10.1 fL (ref 7.5–12.5)
Monocytes Relative: 4.9 %
Neutro Abs: 6285 cells/uL (ref 1500–7800)
Neutrophils Relative %: 41.9 %
Platelets: 458 10*3/uL — ABNORMAL HIGH (ref 140–400)
RBC: 4.58 10*6/uL (ref 3.80–5.10)
RDW: 20.1 % — ABNORMAL HIGH (ref 11.0–15.0)
Total Lymphocyte: 51.1 %
WBC: 15 10*3/uL — ABNORMAL HIGH (ref 3.8–10.8)

## 2020-07-23 LAB — COMPLETE METABOLIC PANEL WITH GFR
AG Ratio: 2.1 (calc) (ref 1.0–2.5)
ALT: 20 U/L (ref 6–29)
AST: 17 U/L (ref 10–35)
Albumin: 4.6 g/dL (ref 3.6–5.1)
Alkaline phosphatase (APISO): 73 U/L (ref 37–153)
BUN: 20 mg/dL (ref 7–25)
CO2: 29 mmol/L (ref 20–32)
Calcium: 10.1 mg/dL (ref 8.6–10.4)
Chloride: 104 mmol/L (ref 98–110)
Creat: 0.73 mg/dL (ref 0.50–0.99)
GFR, Est African American: 98 mL/min/{1.73_m2} (ref >=60)
GFR, Est Non African American: 85 mL/min/{1.73_m2} (ref >=60)
Globulin: 2.2 g/dL (calc) (ref 1.9–3.7)
Glucose, Bld: 97 mg/dL (ref 65–99)
Potassium: 4.4 mmol/L (ref 3.5–5.3)
Sodium: 141 mmol/L (ref 135–146)
Total Bilirubin: 0.5 mg/dL (ref 0.2–1.2)
Total Protein: 6.8 g/dL (ref 6.1–8.1)

## 2020-07-23 LAB — LIPID PANEL
Cholesterol: 190 mg/dL (ref <200)
HDL: 54 mg/dL (ref >=50)
LDL Cholesterol (Calc): 117 mg/dL (calc) — ABNORMAL HIGH
Non-HDL Cholesterol (Calc): 136 mg/dL (calc) — ABNORMAL HIGH (ref <130)
Total CHOL/HDL Ratio: 3.5 (calc) (ref <5.0)
Triglycerides: 89 mg/dL (ref <150)

## 2020-07-23 LAB — VITAMIN D 25 HYDROXY (VIT D DEFICIENCY, FRACTURES): Vit D, 25-Hydroxy: 91 ng/mL (ref 30–100)

## 2020-07-23 LAB — TSH: TSH: 0.62 mIU/L (ref 0.40–4.50)

## 2020-07-24 ENCOUNTER — Ambulatory Visit (INDEPENDENT_AMBULATORY_CARE_PROVIDER_SITE_OTHER): Payer: Medicare Other | Admitting: Family Medicine

## 2020-07-24 ENCOUNTER — Other Ambulatory Visit: Payer: Self-pay

## 2020-07-24 ENCOUNTER — Encounter: Payer: Self-pay | Admitting: Family Medicine

## 2020-07-24 VITALS — BP 134/72 | HR 72 | Temp 98.2°F | Resp 14 | Ht 64.0 in | Wt 182.0 lb

## 2020-07-24 DIAGNOSIS — M8589 Other specified disorders of bone density and structure, multiple sites: Secondary | ICD-10-CM | POA: Diagnosis not present

## 2020-07-24 DIAGNOSIS — E785 Hyperlipidemia, unspecified: Secondary | ICD-10-CM | POA: Diagnosis not present

## 2020-07-24 DIAGNOSIS — M5136 Other intervertebral disc degeneration, lumbar region: Secondary | ICD-10-CM | POA: Diagnosis not present

## 2020-07-24 DIAGNOSIS — C9110 Chronic lymphocytic leukemia of B-cell type not having achieved remission: Secondary | ICD-10-CM | POA: Diagnosis not present

## 2020-07-24 DIAGNOSIS — E041 Nontoxic single thyroid nodule: Secondary | ICD-10-CM | POA: Diagnosis not present

## 2020-07-24 DIAGNOSIS — Z0001 Encounter for general adult medical examination with abnormal findings: Secondary | ICD-10-CM

## 2020-07-24 DIAGNOSIS — E669 Obesity, unspecified: Secondary | ICD-10-CM | POA: Diagnosis not present

## 2020-07-24 DIAGNOSIS — K219 Gastro-esophageal reflux disease without esophagitis: Secondary | ICD-10-CM | POA: Diagnosis not present

## 2020-07-24 DIAGNOSIS — Z0000 Encounter for general adult medical examination without abnormal findings: Secondary | ICD-10-CM

## 2020-07-24 DIAGNOSIS — Z1231 Encounter for screening mammogram for malignant neoplasm of breast: Secondary | ICD-10-CM | POA: Diagnosis not present

## 2020-07-24 MED ORDER — DICLOFENAC SODIUM 50 MG PO TBEC
50.0000 mg | DELAYED_RELEASE_TABLET | Freq: Two times a day (BID) | ORAL | 2 refills | Status: DC
Start: 2020-07-24 — End: 2020-07-24

## 2020-07-24 MED ORDER — ESTRADIOL 0.1 MG/GM VA CREA: 1.0000 | TOPICAL_CREAM | VAGINAL | 2 refills | Status: AC

## 2020-07-24 MED ORDER — DICLOFENAC SODIUM 50 MG PO TBEC: 50.0000 mg | DELAYED_RELEASE_TABLET | Freq: Two times a day (BID) | ORAL | 2 refills | Status: DC

## 2020-07-24 MED ORDER — DICLOFENAC SODIUM 50 MG PO TBEC: 50.0000 mg | DELAYED_RELEASE_TABLET | Freq: Two times a day (BID) | ORAL | 2 refills | Status: AC

## 2020-07-24 NOTE — Patient Instructions (Signed)
[facility] for Thyroid Ultrasound  F/U as needed

## 2020-07-24 NOTE — Progress Notes (Signed)
Subjective:   Patient presents for Medicare Annual/Subsequent preventive examination.   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 HLD  Depression Screen  PHQ 9 score 9   Activities of Daily Living  In your present state of health, do you have any difficulty performing the following activities?:  Driving? No  Managing money? No  Feeding yourself? No  Getting from bed to chair? No  Climbing a flight of stairs? No  Preparing food and eating?: No  Bathing or showering? No  Getting dressed: No  Getting to the toilet? No  Using the toilet:No  Moving around from place to place: No  In the past year have you fallen or had a near fall?:No    Hearing Difficulties: No  Do you often ask people to speak up or repeat themselves? No  Do you experience ringing or noises in your ears? No Do you have difficulty understanding soft or whispered voices? No  Do you feel that you have a problem with memory? No Do you often misplace items? No  Do you feel safe at home? Yes  Cognitive Testing  Alert? Yes Normal Appearance?Yes  Oriented to person? Yes Place? Yes  Time? Yes  Recall of three objects? Yes  Can  perform simple calculations? Yes  Displays appropriate judgment?Yes  Can read the correct time from a watch face?Yes   List the Names of Other Physician/Practitioners you currently use:   Dermatology, Oncology , eye doctor- Dr. Herbert Deaner  , GI - Dr. Hilarie Fredrickson   D.r Ernestina Patches PMR Screening Tests / Date Colonoscopy   UTD                  Zostavax Due Mammogram UTD  Influenza Vaccine  Pneumonia vaccine- UTD Tetanus/tdap - Due  Flu shot UTD   ROS: GEN- denies fatigue, fever, weight loss,weakness, recent illness HEENT- denies eye drainage, change in vision, nasal discharge, CVS- denies chest pain, palpitations RESP- denies SOB, cough, wheeze ABD- denies N/V, change in stools, abd pain GU- denies dysuria, hematuria, dribbling, incontinence MSK- + joint pain, muscle aches, injury Neuro- denies headache, dizziness, syncope, seizure activity  Physical:Vitals reviewed  GEN- NAD, alert and oriented x3 HEENT- PERRL, EOMI, non injected sclera, pink conjunctiva, MMM, oropharynx clear Neck- Supple, no thryomegaly CVS- RRR, no murmur RESP-CTAB ABD-NABS,soft,NT,ND EXT- No edema Pulses- Radial, DP- 2+   Fall/Audit C neg   Assessment:    Annual wellness medicare exam   Plan:    During the course of the visit the patient was educated and counseled about appropriate screening and preventive services including:    Osteopenia- continue calcium and vitamin D, fosamax,weight  bearing exercises   Hyperlipidemia- much improved with dietary changes   GERD- continue PPI and pepcid as needed   Thyroid nodule- [place] being set up, TFT normal   Chronic pain- ultram as needed  CLL- per oncology, WBC improved     Situational depression - she will be moving closer to family, continue restoril   Prevention UTD      Patient Instructions (the written plan) was given to the patient.  Medicare Attestation  I have personally reviewed:  The patient's medical and social history  Their  use of alcohol, tobacco or illicit drugs  Their current medications and supplements  The patient's functional ability including ADLs,fall risks, home safety risks, cognitive, and hearing and visual impairment  Diet and physical activities  Evidence for depression or mood disorders  The patient's weight, height, BMI, and visual acuity have been recorded in the chart. I have made referrals, counseling, and provided education to the patient based on review of the above and I have provided the patient with a written personalized care plan for preventive services.

## 2020-07-26 ENCOUNTER — Encounter: Payer: Self-pay | Admitting: Family Medicine

## 2020-07-27 DIAGNOSIS — M9903 Segmental and somatic dysfunction of lumbar region: Secondary | ICD-10-CM | POA: Diagnosis not present

## 2020-07-27 DIAGNOSIS — M47816 Spondylosis without myelopathy or radiculopathy, lumbar region: Secondary | ICD-10-CM | POA: Diagnosis not present

## 2020-07-28 ENCOUNTER — Telehealth: Payer: Self-pay | Admitting: Physical Medicine and Rehabilitation

## 2020-07-28 NOTE — Telephone Encounter (Signed)
Called pt bc and sch for 11/9

## 2020-07-28 NOTE — Telephone Encounter (Signed)
Patient called requesting a call back from Dr. Romona Curls office for lower back injections. Please call patient at [phone] to set appt.

## 2020-08-03 ENCOUNTER — Ambulatory Visit
Admission: RE | Admit: 2020-08-03 | Discharge: 2020-08-03 | Disposition: A | Discharge status: Home / Self Care | Payer: Medicare Other | Source: Ambulatory Visit | Attending: Family Medicine | Admitting: Family Medicine

## 2020-08-03 DIAGNOSIS — E041 Nontoxic single thyroid nodule: Secondary | ICD-10-CM

## 2020-08-03 DIAGNOSIS — M47816 Spondylosis without myelopathy or radiculopathy, lumbar region: Secondary | ICD-10-CM | POA: Diagnosis not present

## 2020-08-03 DIAGNOSIS — M9903 Segmental and somatic dysfunction of lumbar region: Secondary | ICD-10-CM | POA: Diagnosis not present

## 2020-08-03 NOTE — Procedures (Signed)
Lumbar Diagnostic Facet Joint Nerve Block with Fluoroscopic Guidance   Patient: Kimberly King      Date of Birth: 1951-02-23 MRN: [id number] PCP: Alycia Rossetti, MD      Visit Date: 07/22/2020   Universal Protocol:    Date/Time: 10/25/215:59 AM  Consent Given By: the patient  Position: PRONE  Additional Comments: Vital signs were monitored before and after the procedure. Patient was prepped and draped in the usual sterile fashion. The correct patient, procedure, and site was verified.   Injection Procedure Details:   Procedure diagnoses:  1. Spondylosis without myelopathy or radiculopathy, lumbar region      Meds Administered:  Meds ordered this encounter  Medications  . betamethasone acetate-betamethasone sodium phosphate (CELESTONE) injection 12 mg     Laterality: Bilateral  Location/Site:  L4-L5 L5-S1  Needle size: 22 ga.  Needle type:spinal  Needle Placement: Oblique pedical  Findings:   -Comments: There was excellent flow of contrast along the articular pillars without intravascular flow.  Procedure Details: The fluoroscope beam is vertically oriented in AP and then obliqued 15 to 20 degrees to the ipsilateral side of the desired nerve to achieve the "Scotty dog" appearance.  The skin over the target area of the junction of the superior articulating process and the transverse process (sacral ala if blocking the L5 dorsal rami) was locally anesthetized with a 1 ml volume of 1% Lidocaine without Epinephrine.  The spinal needle was inserted and advanced in a trajectory view down to the target.   After contact with periosteum and negative aspirate for blood and CSF, correct placement without intravascular or epidural spread was confirmed by injecting 0.5 ml. of Isovue-250.  A spot radiograph was obtained of this image.    Next, a 0.5 ml. volume of the injectate described above was injected. The needle was then redirected to the other facet joint nerves  mentioned above if needed.  Prior to the procedure, the patient was given a Pain Diary which was completed for baseline measurements.  After the procedure, the patient rated their pain every 30 minutes and will continue rating at this frequency for a total of 5 hours.  The patient has been asked to complete the Diary and return to [place] by mail, fax or hand delivered as soon as possible.   Additional Comments:  The patient tolerated the procedure well Dressing: 2 x 2 sterile gauze and Band-Aid    Post-procedure details: Patient was observed during the procedure. Post-procedure instructions were reviewed.  Patient left the clinic in stable condition.

## 2020-08-03 NOTE — Progress Notes (Signed)
Kimberly King - 69 y.o. female MRN [id number]  Date of birth: [birth year]  Office Visit Note: Visit Date: 07/22/2020 PCP: Alycia Rossetti, MD Referred by: Alycia Rossetti, MD  Subjective: Chief Complaint  Patient presents with  . Lower Back - Pain   HPI:  Kimberly King is a 69 y.o. female who comes in today at the request of Dr. Laurence Spates for planned Bilateral L4-L5 and L5-S1 Lumbar facet/medial branch block with fluoroscopic guidance.  The patient has failed conservative care including home exercise, medications, time and activity modification.  This injection will be diagnostic and hopefully therapeutic.  Please see requesting physician notes for further details and justification.  Exam has shown concordant pain with facet joint loading.   ROS Otherwise per HPI.  Assessment & Plan: Visit Diagnoses:  1. Spondylosis without myelopathy or radiculopathy, lumbar region     Plan: No additional findings.   Meds & Orders:  Meds ordered this encounter  Medications  . betamethasone acetate-betamethasone sodium phosphate (CELESTONE) injection 12 mg    Orders Placed This Encounter  Procedures  . Facet Injection  . XR C-ARM NO REPORT    Follow-up: Return for Review Pain Diary.   Procedures: No procedures performed  Lumbar Diagnostic Facet Joint Nerve Block with Fluoroscopic Guidance   Patient: Kimberly King      Date of Birth: 1950/11/16 MRN: [id number] PCP: Alycia Rossetti, MD      Visit Date: 07/22/2020   Universal Protocol:    Date/Time: 10/25/215:59 AM  Consent Given By: the patient  Position: PRONE  Additional Comments: Vital signs were monitored before and after the procedure. Patient was prepped and draped in the usual sterile fashion. The correct patient, procedure, and site was verified.   Injection Procedure Details:   Procedure diagnoses:  1. Spondylosis without myelopathy or radiculopathy, lumbar region      Meds Administered:  Meds  ordered this encounter  Medications  . betamethasone acetate-betamethasone sodium phosphate (CELESTONE) injection 12 mg     Laterality: Bilateral  Location/Site:  L4-L5 L5-S1  Needle size: 22 ga.  Needle type:spinal  Needle Placement: Oblique pedical  Findings:   -Comments: There was excellent flow of contrast along the articular pillars without intravascular flow.  Procedure Details: The fluoroscope beam is vertically oriented in AP and then obliqued 15 to 20 degrees to the ipsilateral side of the desired nerve to achieve the "Scotty dog" appearance.  The skin over the target area of the junction of the superior articulating process and the transverse process (sacral ala if blocking the L5 dorsal rami) was locally anesthetized with a 1 ml volume of 1% Lidocaine without Epinephrine.  The spinal needle was inserted and advanced in a trajectory view down to the target.   After contact with periosteum and negative aspirate for blood and CSF, correct placement without intravascular or epidural spread was confirmed by injecting 0.5 ml. of Isovue-250.  A spot radiograph was obtained of this image.    Next, a 0.5 ml. volume of the injectate described above was injected. The needle was then redirected to the other facet joint nerves mentioned above if needed.  Prior to the procedure, the patient was given a Pain Diary which was completed for baseline measurements.  After the procedure, the patient rated their pain every 30 minutes and will continue rating at this frequency for a total of 5 hours.  The patient has been asked to complete the Diary and return to [place] by  mail, fax or hand delivered as soon as possible.   Additional Comments:  The patient tolerated the procedure well Dressing: 2 x 2 sterile gauze and Band-Aid    Post-procedure details: Patient was observed during the procedure. Post-procedure instructions were reviewed.  Patient left the clinic in stable condition.      Clinical History: 01/21/2020 lumbar 2 view x-ray:  Multilevel facet arthritis of the lower lumbar spine with very small listhesis of L5 on S1 with facet arthropathy at L4-5 and L5-S1. Some degenerative disc changes with decent disc height. Normal anatomic alignment otherwise. ---- 03/20/2007 MRI LUMBAR SPINE WITHOUT AND WITH CONTRAST:  Technique: Multiplanar and multiecho pulse sequences of the lumbar spine, to include the lower thoracic region and upper sacral regions, were obtained according to standard protocol before and after administration of intravenous contrast.  A enhanced routine study was obtained on 03/18/07. Due to an abnormality in the conus medullaris, the patient returned on 03/19/07 for intravenous contrast.  Contrast: 16 ml IV Magnevist  No comparison.  Findings: Normal lumbar alignment. There is no vertebral body fracture. Within the spinal cord, there is a cystic cavity at the T11-T12 level measuring 4.3 x 4.3 mm. This does not show any abnormal enhancement, and no underlying mass lesion is seen. There is slight dilatation of the central canal above this cystic cavity.   T12-L1: Small right foraminal disk protrusion.  L1-2: Negative.  L2-3: There is a shallow broad-based disk protrusion without spinal stenosis.   L3-4: Mild disk degeneration and mild disk bulging.  L4-5: Small central disk protrusion with diffuse bulging of the disk. There is mild facet arthropathy and mild spinal stenosis.  L5-S1: Small left foraminal disk protrusion is present. There may be some mild impingement of the left L5 nerve root.  IMPRESSION:  1. 4.3 x 4.3 mm cystic cavity in the distal spinal cord dorsally. This does not show any abnormal enhancement. This may be a syrinx or an area of myelomalacia related to prior cord insult such as infarction or trauma. This may also be a congenital syrinx.   2. Lumbar degenerative changes with a small central disk protrusion and mild  spinal stenosis at L4-5.  3. Small left foraminal disk protrusion at L5-S1.     Objective:  VS:  HT:    WT:   BMI:     BP:139/90  HR:75bpm  TEMP: ( )  RESP:  Physical Exam Constitutional:      General: She is not in acute distress.    Appearance: Normal appearance. She is not ill-appearing.  HENT:     Head: Normocephalic and atraumatic.     Right Ear: External ear normal.     Left Ear: External ear normal.  Eyes:     Extraocular Movements: Extraocular movements intact.  Cardiovascular:     Rate and Rhythm: Normal rate.     Pulses: Normal pulses.  Musculoskeletal:     Right lower leg: No edema.     Left lower leg: No edema.     Comments: Patient has good distal strength with no pain over the greater trochanters.  No clonus or focal weakness.Patient somewhat slow to rise from a seated position to full extension.  There is concordant low back pain with facet loading and lumbar spine extension rotation.  There are no definitive trigger points but the patient is somewhat tender across the lower back and PSIS.  There is no pain with hip rotation.   Skin:    Findings: No  erythema, lesion or rash.  Neurological:     General: No focal deficit present.     Mental Status: She is alert and oriented to person, place, and time.     Sensory: No sensory deficit.     Motor: No weakness or abnormal muscle tone.     Coordination: Coordination normal.  Psychiatric:        Mood and Affect: Mood normal.        Behavior: Behavior normal.      Imaging: No results found.

## 2020-08-05 DIAGNOSIS — Z85828 Personal history of other malignant neoplasm of skin: Secondary | ICD-10-CM | POA: Diagnosis not present

## 2020-08-05 DIAGNOSIS — Z86018 Personal history of other benign neoplasm: Secondary | ICD-10-CM | POA: Diagnosis not present

## 2020-08-05 DIAGNOSIS — B351 Tinea unguium: Secondary | ICD-10-CM | POA: Diagnosis not present

## 2020-08-05 DIAGNOSIS — D2271 Melanocytic nevi of right lower limb, including hip: Secondary | ICD-10-CM | POA: Diagnosis not present

## 2020-08-05 DIAGNOSIS — Z85820 Personal history of malignant melanoma of skin: Secondary | ICD-10-CM | POA: Diagnosis not present

## 2020-08-11 DIAGNOSIS — M9903 Segmental and somatic dysfunction of lumbar region: Secondary | ICD-10-CM | POA: Diagnosis not present

## 2020-08-11 DIAGNOSIS — M47816 Spondylosis without myelopathy or radiculopathy, lumbar region: Secondary | ICD-10-CM | POA: Diagnosis not present

## 2020-08-18 ENCOUNTER — Ambulatory Visit (INDEPENDENT_AMBULATORY_CARE_PROVIDER_SITE_OTHER): Payer: Medicare Other | Admitting: Physical Medicine and Rehabilitation

## 2020-08-18 ENCOUNTER — Other Ambulatory Visit: Payer: Self-pay

## 2020-08-18 ENCOUNTER — Ambulatory Visit: Payer: Self-pay

## 2020-08-18 ENCOUNTER — Encounter: Payer: Self-pay | Admitting: Physical Medicine and Rehabilitation

## 2020-08-18 VITALS — BP 132/85 | HR 83

## 2020-08-18 DIAGNOSIS — M47816 Spondylosis without myelopathy or radiculopathy, lumbar region: Secondary | ICD-10-CM | POA: Diagnosis not present

## 2020-08-18 DIAGNOSIS — M545 Low back pain, unspecified: Secondary | ICD-10-CM

## 2020-08-18 DIAGNOSIS — G8929 Other chronic pain: Secondary | ICD-10-CM

## 2020-08-18 MED ORDER — BUPIVACAINE HCL 0.5 % IJ SOLN
3.0000 mL | Freq: Once | INTRAMUSCULAR | Status: AC
  Administered 2020-08-18: 3 mL

## 2020-08-18 MED ORDER — BETAMETHASONE SOD PHOS & ACET 6 (3-3) MG/ML IJ SUSP
12.0000 mg | Freq: Once | INTRAMUSCULAR | Status: AC
  Administered 2020-08-18: 12 mg

## 2020-08-18 MED ORDER — BETAMETHASONE SOD PHOS & ACET 6 (3-3) MG/ML IJ SUSP: 12.0000 mg | Freq: Once | INTRAMUSCULAR | Status: DC

## 2020-08-18 NOTE — Progress Notes (Signed)
Kimberly King - 69 y.o. female MRN [id number]  Date of birth: April 04, 1951  Office Visit Note: Visit Date: 08/18/2020 PCP: Alycia Rossetti, MD Referred by: Alycia Rossetti, MD  Subjective: Chief Complaint  Patient presents with  . Lower Back - Pain   HPI:  Kimberly King is a 69 y.o. female who comes in today for planned repeat Bilateral L4-L5 and L5-S1 Lumbar facet/medial branch block with fluoroscopic guidance.  The patient has failed conservative care including home exercise, medications, time and activity modification.  This injection will be diagnostic and hopefully therapeutic.  Please see requesting physician notes for further details and justification.  Exam shows concordant low back pain with facet joint loading and extension. Patient received more than 80% pain relief from prior injection. This would be the second block in a diagnostic double block paradigm.     Referring:Dr. Eduard Roux   ROS Otherwise per HPI.  Assessment & Plan: Visit Diagnoses:  1. Spondylosis without myelopathy or radiculopathy, lumbar region   2. Chronic bilateral low back pain without sciatica     Plan: No additional findings.   Meds & Orders:  Meds ordered this encounter  Medications  . bupivacaine (MARCAINE) 0.5 % (with pres) injection 3 mL  . DISCONTD: betamethasone acetate-betamethasone sodium phosphate (CELESTONE) injection 12 mg  . betamethasone acetate-betamethasone sodium phosphate (CELESTONE) injection 12 mg    Orders Placed This Encounter  Procedures  . Facet Injection  . XR C-ARM NO REPORT    Follow-up: Return for Review Pain Diary.   Procedures: No procedures performed  Lumbar Diagnostic Facet Joint Nerve Block with Fluoroscopic Guidance   Patient: Kimberly King      Date of Birth: 1951-09-07 MRN: [id number] PCP: Alycia Rossetti, MD      Visit Date: 08/18/2020   Universal Protocol:    Date/Time: 11/09/211:25 PM  Consent Given By: the patient  Position:  PRONE  Additional Comments: Vital signs were monitored before and after the procedure. Patient was prepped and draped in the usual sterile fashion. The correct patient, procedure, and site was verified.   Injection Procedure Details:   Procedure diagnoses:  1. Spondylosis without myelopathy or radiculopathy, lumbar region   2. Chronic bilateral low back pain without sciatica      Meds Administered:  Meds ordered this encounter  Medications  . bupivacaine (MARCAINE) 0.5 % (with pres) injection 3 mL  . DISCONTD: betamethasone acetate-betamethasone sodium phosphate (CELESTONE) injection 12 mg  . betamethasone acetate-betamethasone sodium phosphate (CELESTONE) injection 12 mg     Laterality: Bilateral  Location/Site:  L4-L5 L5-S1  Needle: 5.0 in., 25 ga.  Short bevel or Quincke spinal needle  Needle Placement: Oblique pedical  Findings:   -Comments: There was excellent flow of contrast along the articular pillars without intravascular flow.  Procedure Details: The fluoroscope beam is vertically oriented in AP and then obliqued 15 to 20 degrees to the ipsilateral side of the desired nerve to achieve the "Scotty dog" appearance.  The skin over the target area of the junction of the superior articulating process and the transverse process (sacral ala if blocking the L5 dorsal rami) was locally anesthetized with a 1 ml volume of 1% Lidocaine without Epinephrine.  The spinal needle was inserted and advanced in a trajectory view down to the target.   After contact with periosteum and negative aspirate for blood and CSF, correct placement without intravascular or epidural spread was confirmed by injecting 0.5 ml. of Isovue-250.  A  spot radiograph was obtained of this image.    Next, a 0.5 ml. volume of the injectate described above was injected. The needle was then redirected to the other facet joint nerves mentioned above if needed.  Prior to the procedure, the patient was given a  Pain Diary which was completed for baseline measurements.  After the procedure, the patient rated their pain every 30 minutes and will continue rating at this frequency for a total of 5 hours.  The patient has been asked to complete the Diary and return to [place] by mail, fax or hand delivered as soon as possible.   Additional Comments:  The patient tolerated the procedure well Dressing: 2 x 2 sterile gauze and Band-Aid    Post-procedure details: Patient was observed during the procedure. Post-procedure instructions were reviewed.  Patient left the clinic in stable condition.     Clinical History: 01/21/2020 lumbar 2 view x-ray:  Multilevel facet arthritis of the lower lumbar spine with very small listhesis of L5 on S1 with facet arthropathy at L4-5 and L5-S1. Some degenerative disc changes with decent disc height. Normal anatomic alignment otherwise. ---- 03/20/2007 MRI LUMBAR SPINE WITHOUT AND WITH CONTRAST:  Technique: Multiplanar and multiecho pulse sequences of the lumbar spine, to include the lower thoracic region and upper sacral regions, were obtained according to standard protocol before and after administration of intravenous contrast.  A enhanced routine study was obtained on 03/18/07. Due to an abnormality in the conus medullaris, the patient returned on 03/19/07 for intravenous contrast.  Contrast: 16 ml IV Magnevist  No comparison.  Findings: Normal lumbar alignment. There is no vertebral body fracture. Within the spinal cord, there is a cystic cavity at the T11-T12 level measuring 4.3 x 4.3 mm. This does not show any abnormal enhancement, and no underlying mass lesion is seen. There is slight dilatation of the central canal above this cystic cavity.   T12-L1: Small right foraminal disk protrusion.  L1-2: Negative.  L2-3: There is a shallow broad-based disk protrusion without spinal stenosis.   L3-4: Mild disk degeneration and mild disk bulging.  L4-5: Small  central disk protrusion with diffuse bulging of the disk. There is mild facet arthropathy and mild spinal stenosis.  L5-S1: Small left foraminal disk protrusion is present. There may be some mild impingement of the left L5 nerve root.  IMPRESSION:  1. 4.3 x 4.3 mm cystic cavity in the distal spinal cord dorsally. This does not show any abnormal enhancement. This may be a syrinx or an area of myelomalacia related to prior cord insult such as infarction or trauma. This may also be a congenital syrinx.   2. Lumbar degenerative changes with a small central disk protrusion and mild spinal stenosis at L4-5.  3. Small left foraminal disk protrusion at L5-S1.     Objective:  VS:  HT:    WT:   BMI:     BP:132/85  HR:83bpm  TEMP: ( )  RESP:  Physical Exam Constitutional:      General: She is not in acute distress.    Appearance: Normal appearance. She is not ill-appearing.  HENT:     Head: Normocephalic and atraumatic.     Right Ear: External ear normal.     Left Ear: External ear normal.  Eyes:     Extraocular Movements: Extraocular movements intact.  Cardiovascular:     Rate and Rhythm: Normal rate.     Pulses: Normal pulses.  Musculoskeletal:     Right lower leg:  No edema.     Left lower leg: No edema.     Comments: Patient has good distal strength with no pain over the greater trochanters.  No clonus or focal weakness. Patient somewhat slow to rise from a seated position to full extension.  There is concordant low back pain with facet loading and lumbar spine extension rotation.  There are no definitive trigger points but the patient is somewhat tender across the lower back and PSIS.  There is no pain with hip rotation.   Skin:    Findings: No erythema, lesion or rash.  Neurological:     General: No focal deficit present.     Mental Status: She is alert and oriented to person, place, and time.     Sensory: No sensory deficit.     Motor: No weakness or abnormal muscle tone.      Coordination: Coordination normal.  Psychiatric:        Mood and Affect: Mood normal.        Behavior: Behavior normal.      Imaging: No results found.

## 2020-08-18 NOTE — Procedures (Signed)
Lumbar Diagnostic Facet Joint Nerve Block with Fluoroscopic Guidance   Patient: Kimberly King      Date of Birth: 10/08/1951 MRN: [id number] PCP: Alycia Rossetti, MD      Visit Date: 08/18/2020   Universal Protocol:    Date/Time: 11/09/211:25 PM  Consent Given By: the patient  Position: PRONE  Additional Comments: Vital signs were monitored before and after the procedure. Patient was prepped and draped in the usual sterile fashion. The correct patient, procedure, and site was verified.   Injection Procedure Details:   Procedure diagnoses:  1. Spondylosis without myelopathy or radiculopathy, lumbar region   2. Chronic bilateral low back pain without sciatica      Meds Administered:  Meds ordered this encounter  Medications  . bupivacaine (MARCAINE) 0.5 % (with pres) injection 3 mL  . DISCONTD: betamethasone acetate-betamethasone sodium phosphate (CELESTONE) injection 12 mg  . betamethasone acetate-betamethasone sodium phosphate (CELESTONE) injection 12 mg     Laterality: Bilateral  Location/Site:  L4-L5 L5-S1  Needle: 5.0 in., 25 ga.  Short bevel or Quincke spinal needle  Needle Placement: Oblique pedical  Findings:   -Comments: There was excellent flow of contrast along the articular pillars without intravascular flow.  Procedure Details: The fluoroscope beam is vertically oriented in AP and then obliqued 15 to 20 degrees to the ipsilateral side of the desired nerve to achieve the "Scotty dog" appearance.  The skin over the target area of the junction of the superior articulating process and the transverse process (sacral ala if blocking the L5 dorsal rami) was locally anesthetized with a 1 ml volume of 1% Lidocaine without Epinephrine.  The spinal needle was inserted and advanced in a trajectory view down to the target.   After contact with periosteum and negative aspirate for blood and CSF, correct placement without intravascular or epidural spread was  confirmed by injecting 0.5 ml. of Isovue-250.  A spot radiograph was obtained of this image.    Next, a 0.5 ml. volume of the injectate described above was injected. The needle was then redirected to the other facet joint nerves mentioned above if needed.  Prior to the procedure, the patient was given a Pain Diary which was completed for baseline measurements.  After the procedure, the patient rated their pain every 30 minutes and will continue rating at this frequency for a total of 5 hours.  The patient has been asked to complete the Diary and return to [place] by mail, fax or hand delivered as soon as possible.   Additional Comments:  The patient tolerated the procedure well Dressing: 2 x 2 sterile gauze and Band-Aid    Post-procedure details: Patient was observed during the procedure. Post-procedure instructions were reviewed.  Patient left the clinic in stable condition.

## 2020-08-18 NOTE — Progress Notes (Signed)
Pt state lower back pain. Pt state bending makes the pain worse. Pt state she use ice and heat to ease the pain. Pt state she takes pain meds to help with pain. Pt has hx of inj on 07/22/20 Pt state it was great and it helped for a few days.  Numeric Pain Rating Scale and Functional Assessment Average Pain 7   In the last MONTH (on 0-10 scale) has pain interfered with the following?  1. General activity like being  able to carry out your everyday physical activities such as walking, climbing stairs, carrying groceries, or moving a chair?  Rating(7)   +Driver, -BT, -Dye Allergies.

## 2020-08-25 ENCOUNTER — Other Ambulatory Visit: Payer: Self-pay | Admitting: Family Medicine

## 2020-08-26 ENCOUNTER — Ambulatory Visit (INDEPENDENT_AMBULATORY_CARE_PROVIDER_SITE_OTHER): Payer: Medicare Other | Admitting: Orthopaedic Surgery

## 2020-08-26 ENCOUNTER — Ambulatory Visit (INDEPENDENT_AMBULATORY_CARE_PROVIDER_SITE_OTHER): Payer: Medicare Other

## 2020-08-26 ENCOUNTER — Encounter: Payer: Self-pay | Admitting: Orthopaedic Surgery

## 2020-08-26 DIAGNOSIS — Z96641 Presence of right artificial hip joint: Secondary | ICD-10-CM | POA: Diagnosis not present

## 2020-08-26 DIAGNOSIS — M25551 Pain in right hip: Secondary | ICD-10-CM | POA: Diagnosis not present

## 2020-08-26 MED ORDER — BUPIVACAINE HCL 0.5 % IJ SOLN
3.0000 mL | INTRAMUSCULAR | Status: AC | PRN
  Administered 2020-08-26: 3 mL via INTRA_ARTICULAR

## 2020-08-26 MED ORDER — LIDOCAINE HCL 1 % IJ SOLN
3.0000 mL | INTRAMUSCULAR | Status: AC | PRN
  Administered 2020-08-26: 3 mL

## 2020-08-26 MED ORDER — METHYLPREDNISOLONE ACETATE 40 MG/ML IJ SUSP
40.0000 mg | INTRAMUSCULAR | Status: AC | PRN
  Administered 2020-08-26: 40 mg via INTRA_ARTICULAR

## 2020-08-26 NOTE — Progress Notes (Signed)
Office Visit Note   Patient: Kimberly King           Date of Birth: [birth year]           MRN: [id number] Visit Date: 08/26/2020              Requested by: Alycia Rossetti, MD [address] [place],  [place] [address] PCP: Alycia Rossetti, MD   Assessment & Plan: Visit Diagnoses:  1. Status post total replacement of right hip   2. Pain in right hip     Plan: Impression is 6 months status post right total hip replacement doing very well.  Dental prophylaxis reinforced.  Activity as tolerated.  Follow-up in 6 months for 1 year visit if she is still here in [place].  For the lateral hip pain this is either trochanteric bursitis versus abductor tendinosis.  Based on discussion patient would like to have a cortisone injection today.  Home exercises were also provided.  Questions encouraged and answered.  Follow-Up Instructions: Return in about 6 months (around 02/23/2021).   Orders:  Orders Placed This Encounter  Procedures  . XR Pelvis 1-2 Views   No orders of the defined types were placed in this encounter.     Procedures: Large Joint Inj: R greater trochanter on 08/26/2020 8:46 AM Indications: pain Details: 22 G needle  Arthrogram: No  Medications: 3 mL lidocaine 1 %; 3 mL bupivacaine 0.5 %; 40 mg methylPREDNISolone acetate 40 MG/ML Patient was prepped and draped in the usual sterile fashion.       Clinical Data: No additional findings.   Subjective: Chief Complaint  Patient presents with  . Right Hip - Pain    Jillien is a 60-month status post right total hip replacement.  She has done very well with the recovery and overall she is very happy with her quality of life.  She is in the process of moving down to [place].  She has had a few months lateral hip pain may have started when she stepped in a hole awkwardly.  She cannot recall any other injuries or trauma.  Denies any numbness and tingling or back pain.   Review of Systems  Constitutional:  Negative.   HENT: Negative.   Eyes: Negative.   Respiratory: Negative.   Cardiovascular: Negative.   Endocrine: Negative.   Musculoskeletal: Negative.   Neurological: Negative.   Hematological: Negative.   Psychiatric/Behavioral: Negative.   All other systems reviewed and are negative.    Objective: Vital Signs: There were no vitals taken for this visit.  Physical Exam Vitals and nursing note reviewed.  Constitutional:      Appearance: She is well-developed.  Pulmonary:     Effort: Pulmonary effort is normal.  Skin:    General: Skin is warm.     Capillary Refill: Capillary refill takes less than 2 seconds.  Neurological:     Mental Status: She is alert and oriented to person, place, and time.  Psychiatric:        Behavior: Behavior normal.        Thought Content: Thought content normal.        Judgment: Judgment normal.     Ortho Exam Right hip shows a fully healed surgical scar.  Good range of motion without pain.  [place] is painless.  She has point tenderness over the lateral aspect of the trochanter.  Some mild pain with resisted hip abduction. Specialty Comments:  No specialty comments available.  Imaging: XR Pelvis 1-2 Views  Result Date: 08/26/2020 Stable total hip replacement without complications    PMFS History: Patient Active Problem List   Diagnosis Date Noted  . Pain in right hip 08/26/2020  . Insomnia 05/11/2020  . Status post total replacement of right hip 02/28/2020  . Chronic midline low back pain without sciatica 01/21/2020  . Chronic pain of right knee 01/21/2020  . Primary osteoarthritis of right hip 12/16/2019  . Situational mixed anxiety and depressive disorder 07/15/2019  . Osteopenia 03/15/2019  . DDD (degenerative disc disease), lumbar 11/12/2018  . HNP (herniated nucleus pulposus), cervical 10/15/2018  . Mild hyperlipidemia 05/23/2018  . S/P TKR (total knee replacement), left 03/19/2018  . Obesity (BMI 30-39.9) 12/31/2017  .  Primary localized osteoarthritis of knees, bilateral 12/05/2017  . Avascular necrosis of bone of hip, right ([place]) 12/05/2017  . Migraines 07/01/2017  . Chronic neck and back pain 07/01/2017  . Thyroid nodule 06/30/2017  . GERD (gastroesophageal reflux disease) 06/30/2017  . Vaginal atrophy 06/30/2017  . Subclinical hyperthyroidism 12/06/2016  . CLL (chronic lymphocytic leukemia) ([place]) 12/17/2013  . COLONIC POLYPS, ADENOMATOUS 10/26/2007   Past Medical History:  Diagnosis Date  . Allergy   . Anemia    Phreesia 07/21/2020  . Anxiety   . Arthritis    knees hips neck  . Cancer [facility])    leukemia dx 06/2013 - no current treatment - being monitored  . Cataract    bilateral - being monitored, no treatment currently  . CLL (chronic lymphocytic leukemia) ([place])   . Depression   . GERD (gastroesophageal reflux disease)   . Leukocytosis    and low blood sodium   . Migraines   . Thyroid nodule    JUST WATCHING    Family History  Problem Relation Age of Onset  . Colon cancer Paternal Grandmother 5  . Esophageal cancer Paternal Uncle   . Arthritis Mother   . Heart disease Father   . Lung cancer Father   . Lung cancer Maternal Grandfather   . Breast cancer Maternal Grandmother   . Cirrhosis Brother   . Rectal cancer Neg Hx   . Stomach cancer Neg Hx     Past Surgical History:  Procedure Laterality Date  . ANTERIOR CERVICAL DECOMP/DISCECTOMY FUSION N/A 10/15/2018   Procedure: PARTIAL REMOVAL OF ANTERIOR CERVICAL PLATE,ANTERIOR CERVICAL DECOMPRESSION/DISCECTOMY FUSION TWO  CERVICAL THREE- CERVICAL FOUR, CERVICAL FOUR- CERVICAL FIVE;  Surgeon: Jovita Gamma, MD;  Location: [facility];  Service: Neurosurgery;  Laterality: N/A;  PARTIAL REMOVAL OF ANTERIOR CERVICAL PLATE,ANTERIOR CERVICAL DECOMPRESSION/DISCECTOMY FUSION TWO  CERVICAL THREE- CERVICAL FOUR, CERVICAL   . APPENDECTOMY    . BLADDER SURGERY     tack  . CERVICAL FUSION     x2  -  C5-C6  . COLONOSCOPY  2014   pyrtle - hx polyps   . DILATION AND CURETTAGE OF UTERUS     x 2  . JOINT REPLACEMENT    . knee     arthroscopy    left   07/2017  . KNEE SURGERY Left 2018  . SPINE SURGERY N/A    Phreesia 07/21/2020  . TONSILLECTOMY    . TOTAL HIP ARTHROPLASTY Right 02/28/2020   Procedure: RIGHT TOTAL HIP ARTHROPLASTY ANTERIOR APPROACH;  Surgeon: Leandrew Koyanagi, MD;  Location: [facility];  Service: Orthopedics;  Laterality: Right;  . TOTAL KNEE ARTHROPLASTY Left 03/19/2018   Procedure: LEFT TOTAL KNEE ARTHROPLASTY;  Surgeon: Leandrew Koyanagi, MD;  Location: [facility];  Service:  Orthopedics;  Laterality: Left;   Social History   Occupational History  . Not on file  Tobacco Use  . Smoking status: Former Smoker    Packs/day: 0.25    Years: 30.00    Pack years: 7.50    Types: Cigarettes    Quit date: 04/22/2015    Years since quitting: 5.3  . Smokeless tobacco: Never Used  Vaping Use  . Vaping Use: Never used  Substance and Sexual Activity  . Alcohol use: Yes    Comment: Occasional  . Drug use: No  . Sexual activity: Not on file

## 2020-09-23 ENCOUNTER — Other Ambulatory Visit: Payer: Self-pay | Admitting: Family Medicine

## 2020-09-23 DIAGNOSIS — M9903 Segmental and somatic dysfunction of lumbar region: Secondary | ICD-10-CM | POA: Diagnosis not present

## 2020-09-23 DIAGNOSIS — M47816 Spondylosis without myelopathy or radiculopathy, lumbar region: Secondary | ICD-10-CM | POA: Diagnosis not present

## 2020-09-23 DIAGNOSIS — C9110 Chronic lymphocytic leukemia of B-cell type not having achieved remission: Secondary | ICD-10-CM

## 2020-09-25 DIAGNOSIS — M9903 Segmental and somatic dysfunction of lumbar region: Secondary | ICD-10-CM | POA: Diagnosis not present

## 2020-09-25 DIAGNOSIS — M47816 Spondylosis without myelopathy or radiculopathy, lumbar region: Secondary | ICD-10-CM | POA: Diagnosis not present

## 2020-09-28 ENCOUNTER — Other Ambulatory Visit: Payer: Self-pay

## 2020-09-28 ENCOUNTER — Ambulatory Visit: Payer: Self-pay

## 2020-09-28 ENCOUNTER — Ambulatory Visit (INDEPENDENT_AMBULATORY_CARE_PROVIDER_SITE_OTHER): Payer: Medicare Other | Admitting: Physical Medicine and Rehabilitation

## 2020-09-28 ENCOUNTER — Encounter: Payer: Self-pay | Admitting: Physical Medicine and Rehabilitation

## 2020-09-28 VITALS — BP 145/90 | HR 75

## 2020-09-28 DIAGNOSIS — M47816 Spondylosis without myelopathy or radiculopathy, lumbar region: Secondary | ICD-10-CM | POA: Diagnosis not present

## 2020-09-28 MED ORDER — BETAMETHASONE SOD PHOS & ACET 6 (3-3) MG/ML IJ SUSP
12.0000 mg | Freq: Once | INTRAMUSCULAR | Status: AC
  Administered 2020-09-28: 12 mg

## 2020-09-28 NOTE — Progress Notes (Signed)
Pt state lower back pain. Pt state walking and standing makes the pain worse. Pt state she use tens unit and pain meds to help ease the pain. Pt has hx of inj on 08/18/20 pt state it worked for a few days.  Numeric Pain Rating Scale and Functional Assessment Average Pain 5   In the last MONTH (on 0-10 scale) has pain interfered with the following?  1. General activity like being  able to carry out your everyday physical activities such as walking, climbing stairs, carrying groceries, or moving a chair?  Rating(8)   +Driver, -BT, -Dye Allergies.

## 2020-10-07 ENCOUNTER — Telehealth: Payer: Self-pay | Admitting: Pharmacist

## 2020-10-07 NOTE — Progress Notes (Addendum)
Chronic Care Management Pharmacy Assistant   Name: Kimberly King  MRN: [id number] DOB: 11-Apr-1951  Reason for Encounter: Disease State for [facility]  Patient Questions:  1.  Have you seen any other providers since your last visit? Yes.   2.  Any changes in your medicines or health? Yes.   PCP : Salley Scarlet, MD   Their chronic conditions include: GERD, osteopenia, hyperlipidemia, anxiety/depression, DDD.  Office Visits: 07/25/11 with Dr. Jeanice Lim Discontinued Mupirocin 2%  and Ondansetron 4.8 mg.   07/22/20 with Donnetta Hail No medication changes.   06/22/20 with Donnetta Hail. No medication changes.   05/20/20 with Donnetta Hail. No medication changes.   Consults: 09/28/20 [facility] with Tyrell Antonio, MD. No medication changes.  08/26/20 with Gershon Mussel M,MD Post op for right hip. No medication changes.   08/18/20 [facility] with Tyrell Antonio, MD facet injections. No medication changes.  07/22/20 [facility] with Tyrell Antonio, MD facet injections. No medication changes.  07/07/20 [facility] with Tyrell Antonio, MD. No medication changes.   07/01/20 Oncology with Si Gaul, MD Disconutined Duloxetine HCL 30 mg. Advised to take OTC iron supplements 1-2 tablets every day.  05/22/20 with Tarry Kos, MD Post op for right hip. No medication changes.    Allergies:  No Known Allergies  Medications: Outpatient Encounter Medications as of 10/07/2020  Medication Sig   alendronate (FOSAMAX) 70 MG tablet TAKE 1 TABLET EVERY 7 (SEVEN) DAYS. TAKE WITH A FULL GLASS OF WATER ON AN EMPTY STOMACH.   aluminum-magnesium hydroxide-simethicone (MAALOX) 200-200-20 MG/5ML SUSP Take 15 mLs by mouth daily as needed (indigestion).   ASPERCREME LIDOCAINE EX Apply 1 application topically daily as needed (pain).   Calcium Carb-Cholecalciferol (CALCIUM 600 + D PO) Take 1 tablet by mouth daily.   Cholecalciferol (VITAMIN D3) 125 MCG (5000 UT) CAPS Take 5,000 Units by mouth 2 (two)  times daily.    DENTA 5000 PLUS 1.1 % CREA dental cream Take 1 application by mouth as directed.   diclofenac (VOLTAREN) 50 MG EC tablet Take 1 tablet (50 mg total) by mouth 2 (two) times daily.   diphenhydrAMINE-PE-APAP (QC SEVERE ALLERGY RELIEF SINUS) 25-5-325 MG TABS Take 1 tablet by mouth daily as needed (allergies).   estradiol (ESTRACE VAGINAL) 0.1 MG/GM vaginal cream Place 1 Applicatorful vaginally 2 (two) times a week.   famotidine (PEPCID) 20 MG tablet TAKE 1 TABLET BY MOUTH TWICE A DAY   ferrous sulfate 324 MG TBEC Take 324 mg by mouth.   fluticasone (FLONASE) 50 MCG/ACT nasal spray Place 1 spray into both nostrils daily as needed for allergies or rhinitis.    Ginger, Zingiber officinalis, (GINGER ROOT PO) Take 1,500 mg by mouth daily.   Melatonin 5 MG CAPS Take 5 mg by mouth at bedtime as needed (sleep).   methocarbamol (ROBAXIN) 750 MG tablet Take 1 tablet (750 mg total) by mouth 2 (two) times daily as needed for muscle spasms.   Misc Natural Products (GLUCOSAMINE CHONDROITIN TRIPLE) TABS Take 2 tablets by mouth daily.   Misc Natural Products (TART CHERRY ADVANCED) CAPS Take 1 capsule by mouth daily.    Multiple Vitamins-Minerals (ICAPS PO) Take 1 tablet by mouth daily.   Omega-3 Fatty Acids (FISH OIL) 1200 MG CAPS Take 1,200 mg by mouth 2 (two) times a week.   pantoprazole (PROTONIX) 40 MG tablet TAKE 1 TABLET BY MOUTH EVERY DAY   Polyethyl Glycol-Propyl Glycol (SYSTANE OP) Place 1 drop into both eyes 2 (two) times daily as needed (for  dry eyes).    SUMAtriptan (IMITREX) 50 MG tablet Take 50 mg by mouth every 2 (two) hours as needed for migraine.    temazepam (RESTORIL) 7.5 MG capsule TAKE 1 CAPSULE (7.5 MG TOTAL) BY MOUTH AT BEDTIME AS NEEDED FOR SLEEP.   traMADol (ULTRAM) 50 MG tablet TAKE 1 TABLET BY MOUTH 2 TIMES DAILY AS NEEDED FOR MODERATE PAIN.   TURMERIC CURCUMIN PO Take 2,600 mg by mouth daily.    Facility-Administered Encounter Medications as of 10/07/2020  Medication    betamethasone acetate-betamethasone sodium phosphate (CELESTONE) injection 12 mg    Current Diagnosis: Patient Active Problem List   Diagnosis Date Noted   Pain in right hip 08/26/2020   Insomnia 05/11/2020   Status post total replacement of right hip 02/28/2020   Chronic midline low back pain without sciatica 01/21/2020   Chronic pain of right knee 01/21/2020   Primary osteoarthritis of right hip 12/16/2019   Situational mixed anxiety and depressive disorder 07/15/2019   Osteopenia 03/15/2019   DDD (degenerative disc disease), lumbar 11/12/2018   HNP (herniated nucleus pulposus), cervical 10/15/2018   Mild hyperlipidemia 05/23/2018   S/P TKR (total knee replacement), left 03/19/2018   Obesity (BMI 30-39.9) 12/31/2017   Primary localized osteoarthritis of knees, bilateral 12/05/2017   Avascular necrosis of bone of hip, right ([place]) 12/05/2017   Migraines 07/01/2017   Chronic neck and back pain 07/01/2017   Thyroid nodule 06/30/2017   GERD (gastroesophageal reflux disease) 06/30/2017   Vaginal atrophy 06/30/2017   Subclinical hyperthyroidism 12/06/2016   CLL (chronic lymphocytic leukemia) ([place]) 12/17/2013   COLONIC POLYPS, ADENOMATOUS 10/26/2007    Goals Addressed   None    Comprehensive medication review performed; Spoke to patient regarding cholesterol  Lipid Panel    Component Value Date/Time   CHOL 190 07/22/2020 0824   TRIG 89 07/22/2020 0824   HDL 54 07/22/2020 0824   LDLCALC 117 (H) 07/22/2020 0824    10-year ASCVD risk score: The 10-year ASCVD risk score Kimberly King., et al., 2013) is: 15.9%   Values used to calculate the score:     Age: 69 years     Sex: Female     Is Non-Hispanic African American: No     Diabetic: No     Tobacco smoker: Yes     Systolic Blood Pressure: [id number] mmHg     Is BP treated: No     HDL Cholesterol: 54 mg/dL     Total Cholesterol: 190 mg/dL  Current antihyperlipidemic regimen:  None  Previous antihyperlipidemic medications  tried: N/A  ASCVD risk enhancing conditions: age >87   What recent interventions/DTPs have been made by any provider to improve Cholesterol control since last CPP Visit: None.  Any recent hospitalizations or ED visits since last visit with CPP? No.   What diet changes have been made to improve Cholesterol?  Patient stated she is not on any particular diet but she does eat a lot of salads and vegetables and not a lot of red meat.  What exercise is being done to improve Cholesterol?  Patient stated she is having back pain so she is not doing much exercise , recently been packing so they may be the cause.   Adherence Review: Does the patient have >5 day gap between last estimated fill dates? No, CPP Please Check.   Patient is not having any problems getting her medication she uses [facility]. Patient is also moving out of the state to be closer to her kids and  grand kids and is closing on her home and packing today.   Follow-Up:  Pharmacist Review   Charlann Lange, RMA Clinical Pharmacist Assistant [phone]  7 minutes spent in review, coordination, and documentation.  Reviewed by: Beverly Milch, PharmD Clinical Pharmacist [facility] [phone]

## 2020-10-08 ENCOUNTER — Ambulatory Visit (HOSPITAL_COMMUNITY): Payer: Medicare Other

## 2020-10-12 ENCOUNTER — Encounter: Payer: Self-pay | Admitting: Physical Medicine and Rehabilitation

## 2020-10-12 ENCOUNTER — Ambulatory Visit: Payer: Self-pay

## 2020-10-12 ENCOUNTER — Ambulatory Visit (INDEPENDENT_AMBULATORY_CARE_PROVIDER_SITE_OTHER): Payer: Medicare Other | Admitting: Physical Medicine and Rehabilitation

## 2020-10-12 ENCOUNTER — Other Ambulatory Visit: Payer: Self-pay

## 2020-10-12 VITALS — BP 150/93 | HR 91

## 2020-10-12 DIAGNOSIS — M47816 Spondylosis without myelopathy or radiculopathy, lumbar region: Secondary | ICD-10-CM | POA: Diagnosis not present

## 2020-10-12 MED ORDER — BETAMETHASONE SOD PHOS & ACET 6 (3-3) MG/ML IJ SUSP
12.0000 mg | Freq: Once | INTRAMUSCULAR | Status: AC
  Administered 2020-10-12: 12 mg

## 2020-10-12 NOTE — Progress Notes (Signed)
Pt state lower back pain. Pt state standing and sitting for a long period of time. Pt state pain meds helps ease the pain. Pt has hx of inj on 09/28/20 pt states it worked well.  Numeric Pain Rating Scale and Functional Assessment Average Pain 2   In the last MONTH (on 0-10 scale) has pain interfered with the following?  1. General activity like being  able to carry out your everyday physical activities such as walking, climbing stairs, carrying groceries, or moving a chair?  Rating(5)   +Driver, -BT, -Dye Allergies.

## 2020-10-12 NOTE — Procedures (Signed)
Lumbar Facet Joint Nerve Denervation  Patient: Kimberly King      Date of Birth: 1951/03/07 MRN: [id number] PCP: Salley Scarlet, MD      Visit Date: 09/28/2020   Universal Protocol:    Date/Time: 01/03/226:31 AM  Consent Given By: the patient  Position: PRONE  Additional Comments: Vital signs were monitored before and after the procedure. Patient was prepped and draped in the usual sterile fashion. The correct patient, procedure, and site was verified.   Injection Procedure Details:   Procedure diagnoses:  1. Spondylosis without myelopathy or radiculopathy, lumbar region      Meds Administered:  Meds ordered this encounter  Medications  . betamethasone acetate-betamethasone sodium phosphate (CELESTONE) injection 12 mg     Laterality: Right  Location/Site:  L4-L5, L3 and L4 medial branches and L5-S1, L4 medial branch and L5 dorsal ramus  Needle: 18 ga.,  69mm active tip RF Cannula  Needle Placement: Along juncture of superior articular process and transverse pocess  Findings:  -Comments:  Procedure Details: For each desired target nerve, the corresponding transverse process (sacral ala for the L5 dorsal rami) was identified and the fluoroscope was positioned to square off the endplates of the corresponding vertebral body to achieve a true AP midline view.  The beam was then obliqued 15 to 20 degrees and caudally tilted 15 to 20 degrees to line up a trajectory along the target nerves. The skin over the target of the junction of superior articulating process and transverse process (sacral ala for the L5 dorsal rami) was infiltrated with 39ml of 1% Lidocaine without Epinephrine.  The 18 gauge 34mm active tip outer cannula was advanced in trajectory view to the target.  This procedure was repeated for each target nerve.  Then, for all levels, the outer cannula placement was fine-tuned and the position was then confirmed with bi-planar imaging.    Test stimulation was  done both at sensory and motor levels to ensure there was no radicular stimulation. The target tissues were then infiltrated with 1 ml of 1% Lidocaine without Epinephrine. Subsequently, a percutaneous neurotomy was carried out for 90 seconds at 80 degrees Celsius.  After the completion of the lesion, 1 ml of injectate was delivered. It was then repeated for each facet joint nerve mentioned above. Appropriate radiographs were obtained to verify the probe placement during the neurotomy.   Additional Comments:  The patient tolerated the procedure well Dressing: 2 x 2 sterile gauze and Band-Aid    Post-procedure details: Patient was observed during the procedure. Post-procedure instructions were reviewed.  Patient left the clinic in stable condition.

## 2020-10-12 NOTE — Procedures (Signed)
Lumbar Facet Joint Nerve Denervation  Patient: Kimberly King      Date of Birth: 09/16/1951 MRN: [id number] PCP: Salley Scarlet, MD      Visit Date: 10/12/2020   Universal Protocol:    Date/Time: 01/03/224:00 PM  Consent Given By: the patient  Position: PRONE  Additional Comments: Vital signs were monitored before and after the procedure. Patient was prepped and draped in the usual sterile fashion. The correct patient, procedure, and site was verified.   Injection Procedure Details:   Procedure diagnoses:  1. Spondylosis without myelopathy or radiculopathy, lumbar region      Meds Administered:  Meds ordered this encounter  Medications  . betamethasone acetate-betamethasone sodium phosphate (CELESTONE) injection 12 mg     Laterality: Left  Location/Site:  L4-L5, L3 and L4 medial branches and L5-S1, L4 medial branch and L5 dorsal ramus  Needle: 18 ga.,  27mm active tip RF Cannula  Needle Placement: Along juncture of superior articular process and transverse pocess  Findings:  -Comments:  Procedure Details: For each desired target nerve, the corresponding transverse process (sacral ala for the L5 dorsal rami) was identified and the fluoroscope was positioned to square off the endplates of the corresponding vertebral body to achieve a true AP midline view.  The beam was then obliqued 15 to 20 degrees and caudally tilted 15 to 20 degrees to line up a trajectory along the target nerves. The skin over the target of the junction of superior articulating process and transverse process (sacral ala for the L5 dorsal rami) was infiltrated with 72ml of 1% Lidocaine without Epinephrine.  The 18 gauge 77mm active tip outer cannula was advanced in trajectory view to the target.  This procedure was repeated for each target nerve.  Then, for all levels, the outer cannula placement was fine-tuned and the position was then confirmed with bi-planar imaging.    Test stimulation was done  both at sensory and motor levels to ensure there was no radicular stimulation. The target tissues were then infiltrated with 1 ml of 1% Lidocaine without Epinephrine. Subsequently, a percutaneous neurotomy was carried out for 90 seconds at 80 degrees Celsius.  After the completion of the lesion, 1 ml of injectate was delivered. It was then repeated for each facet joint nerve mentioned above. Appropriate radiographs were obtained to verify the probe placement during the neurotomy.   Additional Comments:  The patient tolerated the procedure well Dressing: 2 x 2 sterile gauze and Band-Aid    Post-procedure details: Patient was observed during the procedure. Post-procedure instructions were reviewed.  Patient left the clinic in stable condition.

## 2020-10-12 NOTE — Progress Notes (Signed)
Kimberly King - 70 y.o. female MRN [id number]  Date of birth: [birth year]  Office Visit Note: Visit Date: 10/12/2020 PCP: Salley Scarleturham, Kawanta F, MD Referred by: Salley Scarleturham, Kawanta F, MD  Subjective: Chief Complaint  Patient presents with  . Lower Back - Pain   HPI:  Kimberly King is a 70 y.o. female who comes in today for planned radiofrequency ablation of the Left L4-L5 and L5-S1 Lumbar facet joints. This would be ablation of the corresponding medial branches and/or dorsal rami.  Patient has had double diagnostic blocks with more than 50% relief.  These are documented on pain diary.  They have had chronic back pain for quite some time, more than 3 months, which has been an ongoing situation with recalcitrant axial back pain.  They have no radicular pain.  Their axial pain is worse with standing and ambulating and on exam today with facet loading.  They have had physical therapy as well as home exercise program.  The imaging noted in the chart below indicated facet pathology. Accordingly they meet all the criteria and qualification for for radiofrequency ablation and we are going to complete this today hopefully for more longer term relief as part of comprehensive management program.  ROS Otherwise per HPI.  Assessment & Plan: Visit Diagnoses:    ICD-10-CM   1. Spondylosis without myelopathy or radiculopathy, lumbar region  M47.816 XR C-ARM NO REPORT    Radiofrequency,Lumbar    betamethasone acetate-betamethasone sodium phosphate (CELESTONE) injection 12 mg    Plan: No additional findings.   Meds & Orders:  Meds ordered this encounter  Medications  . betamethasone acetate-betamethasone sodium phosphate (CELESTONE) injection 12 mg    Orders Placed This Encounter  Procedures  . Radiofrequency,Lumbar  . XR C-ARM NO REPORT    Follow-up: Return if symptoms worsen or fail to improve.   Procedures: No procedures performed  Lumbar Facet Joint Nerve Denervation  Patient: Kimberly GeorgiaRenita G  King      Date of Birth: [birth year] MRN: [id number] PCP: Salley Scarleturham, Kawanta F, MD      Visit Date: 10/12/2020   Universal Protocol:    Date/Time: 01/03/224:00 PM  Consent Given By: the patient  Position: PRONE  Additional Comments: Vital signs were monitored before and after the procedure. Patient was prepped and draped in the usual sterile fashion. The correct patient, procedure, and site was verified.   Injection Procedure Details:   Procedure diagnoses:  1. Spondylosis without myelopathy or radiculopathy, lumbar region      Meds Administered:  Meds ordered this encounter  Medications  . betamethasone acetate-betamethasone sodium phosphate (CELESTONE) injection 12 mg     Laterality: Left  Location/Site:  L4-L5, L3 and L4 medial branches and L5-S1, L4 medial branch and L5 dorsal ramus  Needle: 18 ga.,  10mm active tip RF Cannula  Needle Placement: Along juncture of superior articular process and transverse pocess  Findings:  -Comments:  Procedure Details: For each desired target nerve, the corresponding transverse process (sacral ala for the L5 dorsal rami) was identified and the fluoroscope was positioned to square off the endplates of the corresponding vertebral body to achieve a true AP midline view.  The beam was then obliqued 15 to 20 degrees and caudally tilted 15 to 20 degrees to line up a trajectory along the target nerves. The skin over the target of the junction of superior articulating process and transverse process (sacral ala for the L5 dorsal rami) was infiltrated with 1ml of 1% Lidocaine without Epinephrine.  The 18 gauge 23mm active tip outer cannula was advanced in trajectory view to the target.  This procedure was repeated for each target nerve.  Then, for all levels, the outer cannula placement was fine-tuned and the position was then confirmed with bi-planar imaging.    Test stimulation was done both at sensory and motor levels to ensure there was no  radicular stimulation. The target tissues were then infiltrated with 1 ml of 1% Lidocaine without Epinephrine. Subsequently, a percutaneous neurotomy was carried out for 90 seconds at 80 degrees Celsius.  After the completion of the lesion, 1 ml of injectate was delivered. It was then repeated for each facet joint nerve mentioned above. Appropriate radiographs were obtained to verify the probe placement during the neurotomy.   Additional Comments:  The patient tolerated the procedure well Dressing: 2 x 2 sterile gauze and Band-Aid    Post-procedure details: Patient was observed during the procedure. Post-procedure instructions were reviewed.  Patient left the clinic in stable condition.        Clinical History: 01/21/2020 lumbar 2 view x-ray:  Multilevel facet arthritis of the lower lumbar spine with very small listhesis of L5 on S1 with facet arthropathy at L4-5 and L5-S1. Some degenerative disc changes with decent disc height. Normal anatomic alignment otherwise. ---- 03/20/2007 MRI LUMBAR SPINE WITHOUT AND WITH CONTRAST:  Technique: Multiplanar and multiecho pulse sequences of the lumbar spine, to include the lower thoracic region and upper sacral regions, were obtained according to standard protocol before and after administration of intravenous contrast.  A enhanced routine study was obtained on 03/18/07. Due to an abnormality in the conus medullaris, the patient returned on 03/19/07 for intravenous contrast.  Contrast: 16 ml IV Magnevist  No comparison.  Findings: Normal lumbar alignment. There is no vertebral body fracture. Within the spinal cord, there is a cystic cavity at the T11-T12 level measuring 4.3 x 4.3 mm. This does not show any abnormal enhancement, and no underlying mass lesion is seen. There is slight dilatation of the central canal above this cystic cavity.   T12-L1: Small right foraminal disk protrusion.  L1-2: Negative.  L2-3: There is a shallow  broad-based disk protrusion without spinal stenosis.   L3-4: Mild disk degeneration and mild disk bulging.  L4-5: Small central disk protrusion with diffuse bulging of the disk. There is mild facet arthropathy and mild spinal stenosis.  L5-S1: Small left foraminal disk protrusion is present. There may be some mild impingement of the left L5 nerve root.  IMPRESSION:  1. 4.3 x 4.3 mm cystic cavity in the distal spinal cord dorsally. This does not show any abnormal enhancement. This may be a syrinx or an area of myelomalacia related to prior cord insult such as infarction or trauma. This may also be a congenital syrinx.   2. Lumbar degenerative changes with a small central disk protrusion and mild spinal stenosis at L4-5.  3. Small left foraminal disk protrusion at L5-S1.     Objective:  VS:  HT:    WT:   BMI:     BP:(!) 150/93  HR:91bpm  TEMP: ( )  RESP:  Physical Exam Vitals and nursing note reviewed.  Constitutional:      General: She is not in acute distress.    Appearance: Normal appearance. She is not ill-appearing.  HENT:     Head: Normocephalic and atraumatic.     Right Ear: External ear normal.     Left Ear: External ear normal.  Eyes:  Extraocular Movements: Extraocular movements intact.  Cardiovascular:     Rate and Rhythm: Normal rate.     Pulses: Normal pulses.  Pulmonary:     Effort: Pulmonary effort is normal. No respiratory distress.  Abdominal:     General: There is no distension.     Palpations: Abdomen is soft.  Musculoskeletal:        General: Tenderness present.     Cervical back: Neck supple.     Right lower leg: No edema.     Left lower leg: No edema.     Comments: Patient has good distal strength with no pain over the greater trochanters.  No clonus or focal weakness. Patient somewhat slow to rise from a seated position to full extension.  There is concordant low back pain with facet loading and lumbar spine extension rotation.  There are  no definitive trigger points but the patient is somewhat tender across the lower back and PSIS.  There is no pain with hip rotation.   Skin:    Findings: No erythema, lesion or rash.  Neurological:     General: No focal deficit present.     Mental Status: She is alert and oriented to person, place, and time.     Sensory: No sensory deficit.     Motor: No weakness or abnormal muscle tone.     Coordination: Coordination normal.  Psychiatric:        Mood and Affect: Mood normal.        Behavior: Behavior normal.      Imaging: No results found.

## 2020-10-12 NOTE — Progress Notes (Signed)
Kimberly King - 70 y.o. female MRN [id number]  Date of birth: Jan 01, 1951  Office Visit Note: Visit Date: 09/28/2020 PCP: Alycia Rossetti, MD Referred by: Alycia Rossetti, MD  Subjective: Chief Complaint  Patient presents with  . Lower Back - Pain   HPI:  Kimberly King is a 70 y.o. female who comes in today for planned radiofrequency ablation of the Right L4-L5 and L5-S1 Lumbar facet joints. This would be ablation of the corresponding medial branches and/or dorsal rami.  Patient has had double diagnostic blocks with more than 50% relief.  These are documented on pain diary.  They have had chronic back pain for quite some time, more than 3 months, which has been an ongoing situation with recalcitrant axial back pain.  They have no radicular pain.  Their axial pain is worse with standing and ambulating and on exam today with facet loading.  They have had physical therapy as well as home exercise program.  The imaging noted in the chart below indicated facet pathology. Accordingly they meet all the criteria and qualification for for radiofrequency ablation and we are going to complete this today hopefully for more longer term relief as part of comprehensive management program.   Review of Systems  Musculoskeletal: Positive for back pain.  All other systems reviewed and are negative.  Otherwise per HPI.  Assessment & Plan: Visit Diagnoses:    ICD-10-CM   1. Spondylosis without myelopathy or radiculopathy, lumbar region  M47.816 XR C-ARM NO REPORT    Radiofrequency,Lumbar    betamethasone acetate-betamethasone sodium phosphate (CELESTONE) injection 12 mg    Plan: No additional findings.   Meds & Orders:  Meds ordered this encounter  Medications  . betamethasone acetate-betamethasone sodium phosphate (CELESTONE) injection 12 mg    Orders Placed This Encounter  Procedures  . Radiofrequency,Lumbar  . XR C-ARM NO REPORT    Follow-up: Return if symptoms worsen or fail to  improve.   Procedures: No procedures performed  Lumbar Facet Joint Nerve Denervation  Patient: Kimberly King      Date of Birth: Feb 03, 1951 MRN: [id number] PCP: Alycia Rossetti, MD      Visit Date: 09/28/2020   Universal Protocol:    Date/Time: 01/03/226:31 AM  Consent Given By: the patient  Position: PRONE  Additional Comments: Vital signs were monitored before and after the procedure. Patient was prepped and draped in the usual sterile fashion. The correct patient, procedure, and site was verified.   Injection Procedure Details:   Procedure diagnoses:  1. Spondylosis without myelopathy or radiculopathy, lumbar region      Meds Administered:  Meds ordered this encounter  Medications  . betamethasone acetate-betamethasone sodium phosphate (CELESTONE) injection 12 mg     Laterality: Right  Location/Site:  L4-L5, L3 and L4 medial branches and L5-S1, L4 medial branch and L5 dorsal ramus  Needle: 18 ga.,  41mm active tip RF Cannula  Needle Placement: Along juncture of superior articular process and transverse pocess  Findings:  -Comments:  Procedure Details: For each desired target nerve, the corresponding transverse process (sacral ala for the L5 dorsal rami) was identified and the fluoroscope was positioned to square off the endplates of the corresponding vertebral body to achieve a true AP midline view.  The beam was then obliqued 15 to 20 degrees and caudally tilted 15 to 20 degrees to line up a trajectory along the target nerves. The skin over the target of the junction of superior articulating process and transverse  process (sacral ala for the L5 dorsal rami) was infiltrated with 62ml of 1% Lidocaine without Epinephrine.  The 18 gauge 76mm active tip outer cannula was advanced in trajectory view to the target.  This procedure was repeated for each target nerve.  Then, for all levels, the outer cannula placement was fine-tuned and the position was then confirmed  with bi-planar imaging.    Test stimulation was done both at sensory and motor levels to ensure there was no radicular stimulation. The target tissues were then infiltrated with 1 ml of 1% Lidocaine without Epinephrine. Subsequently, a percutaneous neurotomy was carried out for 90 seconds at 80 degrees Celsius.  After the completion of the lesion, 1 ml of injectate was delivered. It was then repeated for each facet joint nerve mentioned above. Appropriate radiographs were obtained to verify the probe placement during the neurotomy.   Additional Comments:  The patient tolerated the procedure well Dressing: 2 x 2 sterile gauze and Band-Aid    Post-procedure details: Patient was observed during the procedure. Post-procedure instructions were reviewed.  Patient left the clinic in stable condition.        Clinical History: 01/21/2020 lumbar 2 view x-ray:  Multilevel facet arthritis of the lower lumbar spine with very small listhesis of L5 on S1 with facet arthropathy at L4-5 and L5-S1. Some degenerative disc changes with decent disc height. Normal anatomic alignment otherwise. ---- 03/20/2007 MRI LUMBAR SPINE WITHOUT AND WITH CONTRAST:  Technique: Multiplanar and multiecho pulse sequences of the lumbar spine, to include the lower thoracic region and upper sacral regions, were obtained according to standard protocol before and after administration of intravenous contrast.  A enhanced routine study was obtained on 03/18/07. Due to an abnormality in the conus medullaris, the patient returned on 03/19/07 for intravenous contrast.  Contrast: 16 ml IV Magnevist  No comparison.  Findings: Normal lumbar alignment. There is no vertebral body fracture. Within the spinal cord, there is a cystic cavity at the T11-T12 level measuring 4.3 x 4.3 mm. This does not show any abnormal enhancement, and no underlying mass lesion is seen. There is slight dilatation of the central canal above this cystic  cavity.   T12-L1: Small right foraminal disk protrusion.  L1-2: Negative.  L2-3: There is a shallow broad-based disk protrusion without spinal stenosis.   L3-4: Mild disk degeneration and mild disk bulging.  L4-5: Small central disk protrusion with diffuse bulging of the disk. There is mild facet arthropathy and mild spinal stenosis.  L5-S1: Small left foraminal disk protrusion is present. There may be some mild impingement of the left L5 nerve root.  IMPRESSION:  1. 4.3 x 4.3 mm cystic cavity in the distal spinal cord dorsally. This does not show any abnormal enhancement. This may be a syrinx or an area of myelomalacia related to prior cord insult such as infarction or trauma. This may also be a congenital syrinx.   2. Lumbar degenerative changes with a small central disk protrusion and mild spinal stenosis at L4-5.  3. Small left foraminal disk protrusion at L5-S1.     Objective:  VS:  HT:    WT:   BMI:     BP:(!) 145/90  HR:75bpm  TEMP: ( )  RESP:  Physical Exam Constitutional:      General: She is not in acute distress.    Appearance: Normal appearance. She is not ill-appearing.  HENT:     Head: Normocephalic and atraumatic.     Right Ear: External ear normal.  Left Ear: External ear normal.  Eyes:     Extraocular Movements: Extraocular movements intact.  Cardiovascular:     Rate and Rhythm: Normal rate.     Pulses: Normal pulses.  Musculoskeletal:     Right lower leg: No edema.     Left lower leg: No edema.     Comments: Patient has good distal strength with no pain over the greater trochanters.  No clonus or focal weakness. Patient somewhat slow to rise from a seated position to full extension.  There is concordant low back pain with facet loading and lumbar spine extension rotation.  There are no definitive trigger points but the patient is somewhat tender across the lower back and PSIS.  There is no pain with hip rotation.   Skin:    Findings: No  erythema, lesion or rash.  Neurological:     General: No focal deficit present.     Mental Status: She is alert and oriented to person, place, and time.     Sensory: No sensory deficit.     Motor: No weakness or abnormal muscle tone.     Coordination: Coordination normal.  Psychiatric:        Mood and Affect: Mood normal.        Behavior: Behavior normal.      Imaging: No results found.

## 2020-10-19 ENCOUNTER — Ambulatory Visit (HOSPITAL_COMMUNITY)
Admission: RE | Admit: 2020-10-19 | Discharge: 2020-10-19 | Disposition: A | Discharge status: Home / Self Care | Payer: Medicare Other | Source: Ambulatory Visit | Attending: Family Medicine | Admitting: Family Medicine

## 2020-10-19 ENCOUNTER — Other Ambulatory Visit: Payer: Self-pay

## 2020-10-19 DIAGNOSIS — Z1231 Encounter for screening mammogram for malignant neoplasm of breast: Secondary | ICD-10-CM | POA: Insufficient documentation

## 2020-10-21 DIAGNOSIS — Z87891 Personal history of nicotine dependence: Secondary | ICD-10-CM | POA: Diagnosis not present

## 2020-10-21 DIAGNOSIS — D509 Iron deficiency anemia, unspecified: Secondary | ICD-10-CM | POA: Diagnosis not present

## 2020-10-21 DIAGNOSIS — Z1331 Encounter for screening for depression: Secondary | ICD-10-CM | POA: Diagnosis not present

## 2020-10-21 DIAGNOSIS — J3089 Other allergic rhinitis: Secondary | ICD-10-CM | POA: Diagnosis not present

## 2020-10-21 DIAGNOSIS — H259 Unspecified age-related cataract: Secondary | ICD-10-CM | POA: Diagnosis not present

## 2020-10-21 DIAGNOSIS — M1990 Unspecified osteoarthritis, unspecified site: Secondary | ICD-10-CM | POA: Diagnosis not present

## 2020-10-21 DIAGNOSIS — K219 Gastro-esophageal reflux disease without esophagitis: Secondary | ICD-10-CM | POA: Diagnosis not present

## 2020-10-21 DIAGNOSIS — Z85828 Personal history of other malignant neoplasm of skin: Secondary | ICD-10-CM | POA: Diagnosis not present

## 2020-10-21 DIAGNOSIS — Z96652 Presence of left artificial knee joint: Secondary | ICD-10-CM | POA: Diagnosis not present

## 2020-10-21 DIAGNOSIS — M8580 Other specified disorders of bone density and structure, unspecified site: Secondary | ICD-10-CM | POA: Diagnosis not present

## 2020-10-21 DIAGNOSIS — C9110 Chronic lymphocytic leukemia of B-cell type not having achieved remission: Secondary | ICD-10-CM | POA: Diagnosis not present

## 2020-10-21 DIAGNOSIS — Z85820 Personal history of malignant melanoma of skin: Secondary | ICD-10-CM | POA: Diagnosis not present

## 2020-10-21 DIAGNOSIS — Z96641 Presence of right artificial hip joint: Secondary | ICD-10-CM | POA: Diagnosis not present

## 2020-11-04 DIAGNOSIS — E6609 Other obesity due to excess calories: Secondary | ICD-10-CM | POA: Diagnosis not present

## 2020-11-04 DIAGNOSIS — R030 Elevated blood-pressure reading, without diagnosis of hypertension: Secondary | ICD-10-CM | POA: Diagnosis not present

## 2020-11-10 DIAGNOSIS — Z23 Encounter for immunization: Secondary | ICD-10-CM | POA: Diagnosis not present

## 2020-11-16 ENCOUNTER — Telehealth: Payer: Self-pay

## 2020-12-02 DIAGNOSIS — M542 Cervicalgia: Secondary | ICD-10-CM | POA: Diagnosis not present

## 2020-12-02 DIAGNOSIS — Z13820 Encounter for screening for osteoporosis: Secondary | ICD-10-CM | POA: Diagnosis not present

## 2020-12-02 DIAGNOSIS — Z79899 Other long term (current) drug therapy: Secondary | ICD-10-CM | POA: Diagnosis not present

## 2020-12-02 DIAGNOSIS — M25569 Pain in unspecified knee: Secondary | ICD-10-CM | POA: Diagnosis not present

## 2020-12-02 DIAGNOSIS — E2839 Other primary ovarian failure: Secondary | ICD-10-CM | POA: Diagnosis not present

## 2020-12-02 DIAGNOSIS — M1990 Unspecified osteoarthritis, unspecified site: Secondary | ICD-10-CM | POA: Diagnosis not present

## 2020-12-02 DIAGNOSIS — R030 Elevated blood-pressure reading, without diagnosis of hypertension: Secondary | ICD-10-CM | POA: Diagnosis not present

## 2020-12-16 DIAGNOSIS — Z96641 Presence of right artificial hip joint: Secondary | ICD-10-CM | POA: Diagnosis not present

## 2020-12-16 DIAGNOSIS — M47817 Spondylosis without myelopathy or radiculopathy, lumbosacral region: Secondary | ICD-10-CM | POA: Diagnosis not present

## 2020-12-21 DIAGNOSIS — M47817 Spondylosis without myelopathy or radiculopathy, lumbosacral region: Secondary | ICD-10-CM | POA: Diagnosis not present

## 2020-12-21 DIAGNOSIS — M25551 Pain in right hip: Secondary | ICD-10-CM | POA: Diagnosis not present

## 2020-12-21 DIAGNOSIS — M545 Low back pain, unspecified: Secondary | ICD-10-CM | POA: Diagnosis not present

## 2020-12-21 DIAGNOSIS — Z96641 Presence of right artificial hip joint: Secondary | ICD-10-CM | POA: Diagnosis not present

## 2020-12-23 DIAGNOSIS — C9110 Chronic lymphocytic leukemia of B-cell type not having achieved remission: Secondary | ICD-10-CM | POA: Diagnosis not present

## 2020-12-23 DIAGNOSIS — D509 Iron deficiency anemia, unspecified: Secondary | ICD-10-CM | POA: Diagnosis not present

## 2020-12-23 DIAGNOSIS — Z87891 Personal history of nicotine dependence: Secondary | ICD-10-CM | POA: Diagnosis not present

## 2021-01-04 DIAGNOSIS — M545 Low back pain, unspecified: Secondary | ICD-10-CM | POA: Diagnosis not present

## 2021-01-04 DIAGNOSIS — M5416 Radiculopathy, lumbar region: Secondary | ICD-10-CM | POA: Diagnosis not present

## 2021-01-04 DIAGNOSIS — M9904 Segmental and somatic dysfunction of sacral region: Secondary | ICD-10-CM | POA: Diagnosis not present

## 2021-01-04 DIAGNOSIS — M9903 Segmental and somatic dysfunction of lumbar region: Secondary | ICD-10-CM | POA: Diagnosis not present

## 2021-01-07 DIAGNOSIS — M9903 Segmental and somatic dysfunction of lumbar region: Secondary | ICD-10-CM | POA: Diagnosis not present

## 2021-01-07 DIAGNOSIS — M9904 Segmental and somatic dysfunction of sacral region: Secondary | ICD-10-CM | POA: Diagnosis not present

## 2021-01-07 DIAGNOSIS — M545 Low back pain, unspecified: Secondary | ICD-10-CM | POA: Diagnosis not present

## 2021-01-07 DIAGNOSIS — M5416 Radiculopathy, lumbar region: Secondary | ICD-10-CM | POA: Diagnosis not present

## 2021-01-13 DIAGNOSIS — M545 Low back pain, unspecified: Secondary | ICD-10-CM | POA: Diagnosis not present

## 2021-03-12 ENCOUNTER — Telehealth: Payer: Self-pay | Admitting: *Deleted

## 2021-03-12 NOTE — Telephone Encounter (Signed)
Attempted 1 year Ortho bundle call to patient; no answer and left VM requesting call back. 

## 2021-04-07 ENCOUNTER — Telehealth: Payer: Self-pay | Admitting: *Deleted

## 2021-04-07 NOTE — Telephone Encounter (Signed)
Patient did call back and states she is doing well. Ortho bundle 1 year call completed with survey.

## 2021-04-07 NOTE — Telephone Encounter (Signed)
Attempted 1 year Ortho bundle call to patient; No answer and left VM. Did note that patient's address has changed in chart and most likely she has moved out of service area.
